# Patient Record
Sex: Female | Born: 1995 | Race: Black or African American | Hispanic: No | Marital: Single | Smoking: Current some day smoker
Health system: Southern US, Community
[De-identification: ages and names within clinical notes are randomized; demographics above are authoritative.]

## PROBLEM LIST (undated history)

## (undated) ENCOUNTER — Inpatient Hospital Stay (HOSPITAL_COMMUNITY): Payer: Self-pay

## (undated) DIAGNOSIS — O42919 Preterm premature rupture of membranes, unspecified as to length of time between rupture and onset of labor, unspecified trimester: Secondary | ICD-10-CM

## (undated) DIAGNOSIS — Z789 Other specified health status: Secondary | ICD-10-CM

## (undated) DIAGNOSIS — E119 Type 2 diabetes mellitus without complications: Secondary | ICD-10-CM

## (undated) DIAGNOSIS — D649 Anemia, unspecified: Secondary | ICD-10-CM

## (undated) DIAGNOSIS — F419 Anxiety disorder, unspecified: Secondary | ICD-10-CM

## (undated) DIAGNOSIS — M1990 Unspecified osteoarthritis, unspecified site: Secondary | ICD-10-CM

## (undated) HISTORY — PX: TONSILLECTOMY: SUR1361

## (undated) HISTORY — DX: Preterm premature rupture of membranes, unspecified as to length of time between rupture and onset of labor, unspecified trimester: O42.919

## (undated) HISTORY — DX: Unspecified osteoarthritis, unspecified site: M19.90

## (undated) HISTORY — DX: Anemia, unspecified: D64.9

## (undated) HISTORY — DX: Type 2 diabetes mellitus without complications: E11.9

---
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
## 2014-11-17 ENCOUNTER — Inpatient Hospital Stay (HOSPITAL_COMMUNITY)
Admission: AD | Admit: 2014-11-17 | Discharge: 2014-11-17 | Disposition: A | Discharge status: Home / Self Care | Payer: Medicaid Other | Source: Ambulatory Visit | Attending: Obstetrics & Gynecology | Admitting: Obstetrics & Gynecology

## 2014-11-17 ENCOUNTER — Encounter (HOSPITAL_COMMUNITY): Payer: Self-pay | Admitting: *Deleted

## 2014-11-17 DIAGNOSIS — O9989 Other specified diseases and conditions complicating pregnancy, childbirth and the puerperium: Secondary | ICD-10-CM | POA: Diagnosis present

## 2014-11-17 DIAGNOSIS — Z3A15 15 weeks gestation of pregnancy: Secondary | ICD-10-CM | POA: Diagnosis not present

## 2014-11-17 DIAGNOSIS — N926 Irregular menstruation, unspecified: Secondary | ICD-10-CM

## 2014-11-17 NOTE — MAU Provider Note (Signed)
Subjective:   [name] is a 19 y.o. female G1P0 at [redacted]w[redacted]d presenting to MAU for a pregnancy verification letter. She is without complaints. She denies pain or bleeding at this time.  She has not started prenatal care.    Objective:   GENERAL: Well-developed, well-nourished female in no acute distress.  LUNGS: Effort normal SKIN: Warm, dry and without erythema PSYCH: Normal mood and affect  Filed Vitals:   11/17/14 1139  BP: 132/67  Pulse: 93  Temp: 98.8 F (37.1 C)  Resp: 18    MDM: + fetal heart tones by doppler.   Assessment:  Missed period  Plan:  Discharge home in stable condition First trimester warning signs discussed Pregnancy verification letter provided Prenatal vitamins daily.   Duane Lope, NP 11/17/2014 3:14 PM

## 2014-11-17 NOTE — MAU Note (Signed)
Believes she is preg.  2 +HPT.  No problems just needs confirmation.

## 2014-12-08 ENCOUNTER — Encounter: Payer: Medicaid Other | Admitting: Certified Nurse Midwife

## 2014-12-09 ENCOUNTER — Inpatient Hospital Stay (HOSPITAL_COMMUNITY)
Admission: AD | Admit: 2014-12-09 | Discharge: 2014-12-20 | DRG: 765 | Disposition: A | Discharge status: Home / Self Care | Payer: Medicaid Other | Source: Ambulatory Visit | Attending: Obstetrics and Gynecology | Admitting: Obstetrics and Gynecology

## 2014-12-09 ENCOUNTER — Encounter (HOSPITAL_COMMUNITY): Payer: Self-pay | Admitting: *Deleted

## 2014-12-09 ENCOUNTER — Inpatient Hospital Stay (HOSPITAL_COMMUNITY): Payer: Medicaid Other

## 2014-12-09 DIAGNOSIS — O99824 Streptococcus B carrier state complicating childbirth: Secondary | ICD-10-CM | POA: Diagnosis present

## 2014-12-09 DIAGNOSIS — Z720 Tobacco use: Secondary | ICD-10-CM | POA: Diagnosis not present

## 2014-12-09 DIAGNOSIS — O23592 Infection of other part of genital tract in pregnancy, second trimester: Secondary | ICD-10-CM

## 2014-12-09 DIAGNOSIS — O411230 Chorioamnionitis, third trimester, not applicable or unspecified: Secondary | ICD-10-CM | POA: Diagnosis present

## 2014-12-09 DIAGNOSIS — A5901 Trichomonal vulvovaginitis: Secondary | ICD-10-CM | POA: Diagnosis present

## 2014-12-09 DIAGNOSIS — O321XX0 Maternal care for breech presentation, not applicable or unspecified: Principal | ICD-10-CM | POA: Diagnosis present

## 2014-12-09 DIAGNOSIS — O0932 Supervision of pregnancy with insufficient antenatal care, second trimester: Secondary | ICD-10-CM

## 2014-12-09 DIAGNOSIS — Z3A23 23 weeks gestation of pregnancy: Secondary | ICD-10-CM

## 2014-12-09 DIAGNOSIS — O3432 Maternal care for cervical incompetence, second trimester: Secondary | ICD-10-CM | POA: Diagnosis present

## 2014-12-09 DIAGNOSIS — Z3A36 36 weeks gestation of pregnancy: Secondary | ICD-10-CM | POA: Diagnosis present

## 2014-12-09 DIAGNOSIS — O3421 Maternal care for scar from previous cesarean delivery: Secondary | ICD-10-CM | POA: Diagnosis present

## 2014-12-09 DIAGNOSIS — O42919 Preterm premature rupture of membranes, unspecified as to length of time between rupture and onset of labor, unspecified trimester: Secondary | ICD-10-CM | POA: Diagnosis not present

## 2014-12-09 DIAGNOSIS — O9832 Other infections with a predominantly sexual mode of transmission complicating childbirth: Secondary | ICD-10-CM | POA: Diagnosis present

## 2014-12-09 DIAGNOSIS — R109 Unspecified abdominal pain: Secondary | ICD-10-CM

## 2014-12-09 DIAGNOSIS — O42912 Preterm premature rupture of membranes, unspecified as to length of time between rupture and onset of labor, second trimester: Secondary | ICD-10-CM | POA: Diagnosis present

## 2014-12-09 DIAGNOSIS — Z3689 Encounter for other specified antenatal screening: Secondary | ICD-10-CM

## 2014-12-09 DIAGNOSIS — Z98891 History of uterine scar from previous surgery: Secondary | ICD-10-CM

## 2014-12-09 DIAGNOSIS — O99012 Anemia complicating pregnancy, second trimester: Secondary | ICD-10-CM | POA: Diagnosis present

## 2014-12-09 HISTORY — DX: Other specified health status: Z78.9

## 2014-12-09 LAB — DIFFERENTIAL
BASOS PCT: 0 % (ref 0–1)
Basophils Absolute: 0 10*3/uL (ref 0.0–0.1)
EOS PCT: 1 % (ref 0–5)
Eosinophils Absolute: 0.1 10*3/uL (ref 0.0–0.7)
Lymphocytes Relative: 12 % (ref 12–46)
Lymphs Abs: 1.5 10*3/uL (ref 0.7–4.0)
MONO ABS: 1.2 10*3/uL — AB (ref 0.1–1.0)
MONOS PCT: 10 % (ref 3–12)
NEUTROS ABS: 10 10*3/uL — AB (ref 1.7–7.7)
Neutrophils Relative %: 77 % (ref 43–77)

## 2014-12-09 LAB — URINALYSIS, ROUTINE W REFLEX MICROSCOPIC
Bilirubin Urine: NEGATIVE
GLUCOSE, UA: NEGATIVE mg/dL
Ketones, ur: NEGATIVE mg/dL
Nitrite: NEGATIVE
PH: 6 (ref 5.0–8.0)
PROTEIN: 100 mg/dL — AB
Specific Gravity, Urine: >1.03 — ABNORMAL HIGH (ref 1.005–1.030)
Urobilinogen, UA: 0.2 mg/dL (ref 0.0–1.0)

## 2014-12-09 LAB — CBC
HEMATOCRIT: 30.3 % — AB (ref 36.0–46.0)
Hemoglobin: 10.1 g/dL — ABNORMAL LOW (ref 12.0–15.0)
MCH: 28.2 pg (ref 26.0–34.0)
MCHC: 33.3 g/dL (ref 30.0–36.0)
MCV: 84.6 fL (ref 78.0–100.0)
PLATELETS: 219 10*3/uL (ref 150–400)
RBC: 3.58 MIL/uL — ABNORMAL LOW (ref 3.87–5.11)
RDW: 13.3 % (ref 11.5–15.5)
WBC: 12.9 10*3/uL — ABNORMAL HIGH (ref 4.0–10.5)

## 2014-12-09 LAB — URINE MICROSCOPIC-ADD ON

## 2014-12-09 LAB — WET PREP, GENITAL
Clue Cells Wet Prep HPF POC: NONE SEEN
YEAST WET PREP: NONE SEEN

## 2014-12-09 LAB — GROUP B STREP BY PCR: GROUP B STREP BY PCR: POSITIVE — AB

## 2014-12-09 LAB — OB RESULTS CONSOLE GBS: STREP GROUP B AG: POSITIVE

## 2014-12-09 MED ORDER — ZOLPIDEM TARTRATE 5 MG PO TABS
5.0000 mg | ORAL_TABLET | Freq: Every evening | ORAL | Status: DC | PRN
  Administered 2014-12-09: 5 mg via ORAL
  Filled 2014-12-09: qty 1

## 2014-12-09 MED ORDER — ACETAMINOPHEN 325 MG PO TABS
650.0000 mg | ORAL_TABLET | ORAL | Status: DC | PRN
  Administered 2014-12-11: 650 mg via ORAL
  Filled 2014-12-09: qty 2

## 2014-12-09 MED ORDER — DOCUSATE SODIUM 100 MG PO CAPS
100.0000 mg | ORAL_CAPSULE | Freq: Every day | ORAL | Status: DC
  Administered 2014-12-10 – 2014-12-16 (×7): 100 mg via ORAL
  Filled 2014-12-09 (×10): qty 1

## 2014-12-09 MED ORDER — SODIUM CHLORIDE 0.9 % IV SOLN
500.0000 mg | Freq: Four times a day (QID) | INTRAVENOUS | Status: DC
  Administered 2014-12-09: 500 mg via INTRAVENOUS
  Filled 2014-12-09 (×4): qty 10

## 2014-12-09 MED ORDER — MAGNESIUM SULFATE 50 % IJ SOLN
2.0000 g/h | INTRAMUSCULAR | Status: AC
Start: 2014-12-09 — End: 2014-12-10
  Administered 2014-12-10: 2 g/h via INTRAVENOUS
  Filled 2014-12-09 (×2): qty 80

## 2014-12-09 MED ORDER — MAGNESIUM SULFATE BOLUS VIA INFUSION
4.0000 g | Freq: Once | INTRAVENOUS | Status: AC
  Administered 2014-12-10: 4 g via INTRAVENOUS
  Filled 2014-12-09: qty 500

## 2014-12-09 MED ORDER — BETAMETHASONE SOD PHOS & ACET 6 (3-3) MG/ML IJ SUSP
12.0000 mg | INTRAMUSCULAR | Status: AC
  Administered 2014-12-09 – 2014-12-10 (×2): 12 mg via INTRAMUSCULAR
  Filled 2014-12-09 (×2): qty 2

## 2014-12-09 MED ORDER — METRONIDAZOLE 500 MG PO TABS
2000.0000 mg | ORAL_TABLET | Freq: Once | ORAL | Status: AC
  Administered 2014-12-09: 2000 mg via ORAL
  Filled 2014-12-09: qty 4

## 2014-12-09 MED ORDER — SODIUM CHLORIDE 0.9 % IV SOLN
2.0000 g | Freq: Four times a day (QID) | INTRAVENOUS | Status: DC
  Administered 2014-12-09 – 2014-12-11 (×8): 2 g via INTRAVENOUS
  Filled 2014-12-09 (×10): qty 2000

## 2014-12-09 MED ORDER — CALCIUM CARBONATE ANTACID 500 MG PO CHEW
2.0000 | CHEWABLE_TABLET | ORAL | Status: DC | PRN
  Administered 2014-12-09 – 2014-12-13 (×4): 400 mg via ORAL
  Filled 2014-12-09 (×5): qty 2

## 2014-12-09 MED ORDER — LACTATED RINGERS IV SOLN
INTRAVENOUS | Status: DC
  Administered 2014-12-09 – 2014-12-17 (×16): via INTRAVENOUS

## 2014-12-09 MED ORDER — PRENATAL MULTIVITAMIN CH
1.0000 | ORAL_TABLET | Freq: Every day | ORAL | Status: DC
  Administered 2014-12-10 – 2014-12-16 (×7): 1 via ORAL
  Filled 2014-12-09 (×8): qty 1

## 2014-12-09 NOTE — MAU Provider Note (Signed)
History     CSN: [id number]  Arrival date and time: 12/09/14 1611   First Provider Initiated Contact with Patient 12/09/14 1700      Chief Complaint  Patient presents with  . Abdominal Pain   HPI Giordana N Schou 19 y.o. G1P0 @[redacted]w[redacted]d  presents to MAU complaining of lower abdominal pain.  It started at 2:30pm today.  She has not tried anything to make it feel better.  As soon as it started hurting, she called the ambulance to pick her up.  She reports it is 10/10.  She is eating and drinking well.  She last ate at 12:00pm and it was a sausage biscuit and a few donuts.  She denies vaginal bleeding, LOF.  She does have discharge with odor.  She may be feeling fetal movement but is not certain.  She is using a PNV.  She does not yet have PNC established.   OB History    Gravida Para Term Preterm AB TAB SAB Ectopic Multiple Living   1               History reviewed. No pertinent past medical history.  History reviewed. No pertinent past surgical history.  History reviewed. No pertinent family history.  Social History  Substance Use Topics  . Smoking status: Light Tobacco Smoker  . Smokeless tobacco: Never Used  . Alcohol Use: No    Allergies: No Known Allergies  Prescriptions prior to admission  Medication Sig Dispense Refill Last Dose  . acetaminophen (TYLENOL) 325 MG tablet Take 325 mg by mouth every 6 (six) hours as needed for mild pain.   12/09/2014 at 1030  . Prenatal Vit-Fe Fumarate-FA (PRENATAL MULTIVITAMIN) TABS tablet Take 2 tablets by mouth daily at 12 noon.   12/09/2014 at Unknown time    ROS Pertinent ROS in HPI.  All other systems are negative.   Physical Exam   Blood pressure 138/68, pulse 89, temperature 98.1 F (36.7 C), resp. rate 18, last menstrual period 08/04/2014.  Physical Exam  Constitutional: She is oriented to person, place, and time. She appears well-developed and well-nourished. No distress.  HENT:  Head: Normocephalic and atraumatic.  Eyes: EOM  are normal.  Neck: Normal range of motion.  Cardiovascular: Normal rate and normal heart sounds.   Respiratory: No respiratory distress.  GI: Soft. She exhibits no distension. There is no tenderness. There is no rebound and no guarding.  Genitourinary:  Large amt of pink frothy liquid noted.  Bulging bag noted inside lip of cervix Speculum carefully withdrawn.  No bimanual exam performed  Musculoskeletal: Normal range of motion.  Neurological: She is alert and oriented to person, place, and time.  Skin: Skin is warm and dry.  Psychiatric: She has a normal mood and affect.    MAU Course  Procedures  MDM Pt placed in trendelenburg after noting bulging bag.   Bedside u/s confirmed to determine dating of pregnancy Results for orders placed or performed during the hospital encounter of 12/09/14 (from the past 24 hour(s))  Urinalysis, Routine w reflex microscopic (not at [facility])     Status: Abnormal   Collection Time: 12/09/14  4:15 PM  Result Value Ref Range   Color, Urine YELLOW YELLOW   APPearance CLOUDY (A) CLEAR   Specific Gravity, Urine >1.030 (H) 1.005 - 1.030   pH 6.0 5.0 - 8.0   Glucose, UA NEGATIVE NEGATIVE mg/dL   Hgb urine dipstick MODERATE (A) NEGATIVE   Bilirubin Urine NEGATIVE NEGATIVE   Ketones, ur  NEGATIVE NEGATIVE mg/dL   Protein, ur 161 (A) NEGATIVE mg/dL   Urobilinogen, UA 0.2 0.0 - 1.0 mg/dL   Nitrite NEGATIVE NEGATIVE   Leukocytes, UA MODERATE (A) NEGATIVE  Urine microscopic-add on     Status: Abnormal   Collection Time: 12/09/14  4:15 PM  Result Value Ref Range   Squamous Epithelial / LPF FEW (A) RARE   WBC, UA TOO NUMEROUS TO COUNT <3 WBC/hpf   RBC / HPF 7-10 <3 RBC/hpf   Crystals CA OXALATE CRYSTALS (A) NEGATIVE   Urine-Other TRICHOMONAS PRESENT   Wet prep, genital     Status: Abnormal   Collection Time: 12/09/14  5:10 PM  Result Value Ref Range   Yeast Wet Prep HPF POC NONE SEEN NONE SEEN   Trich, Wet Prep FEW (A) NONE SEEN   Clue Cells Wet Prep HPF  POC NONE SEEN NONE SEEN   WBC, Wet Prep HPF POC MODERATE (A) NONE SEEN   Trichomonas noted.   IV started as pt is 23+ weeks by bedside scan.  Final U/S report confirms 23 w4d.   Dr. Despina Hidden to bedside to eval pt.  Pt to be admitted.    Assessment and Plan  A: Threatened PPROM with bulging bag Trichomonas  P: Admit  Bertram Denver 12/09/2014, 5:01 PM

## 2014-12-09 NOTE — MAU Note (Signed)
Pt presents to MAU via EMS for complaints of pain in her lower abdomen when she voids. PT states that she noticed a scant amount of bleeding earlier when she wiped also.

## 2014-12-09 NOTE — H&P (Signed)
Chief Complaint  Patient presents with  . Abdominal Pain   HPI Melinda Richardson 19 y.o. G1P0 @[redacted]w[redacted]d  presents to MAU complaining of lower abdominal pain/pressure. It started at 2:30pm today. She has not tried anything to make it feel better. As soon as it started hurting, she called the ambulance to pick her up. She reports it is 10/10. She is eating and drinking well. She last ate at 12:00pm and it was a sausage biscuit and a few donuts. She denies vaginal bleeding, LOF. She does have discharge with odor. She may be feeling fetal movement but is not certain. She is using a PNV. She does not yet have PNC established.  OB History    Gravida Para Term Preterm AB TAB SAB Ectopic Multiple Living   1               History reviewed. No pertinent past medical history.  History reviewed. No pertinent past surgical history.  History reviewed. No pertinent family history.  Social History  Substance Use Topics  . Smoking status: Light Tobacco Smoker  . Smokeless tobacco: Never Used  . Alcohol Use: No    Allergies: No Known Allergies  Prescriptions prior to admission  Medication Sig Dispense Refill Last Dose  . acetaminophen (TYLENOL) 325 MG tablet Take 325 mg by mouth every 6 (six) hours as needed for mild pain.   12/09/2014 at 1030  . Prenatal Vit-Fe Fumarate-FA (PRENATAL MULTIVITAMIN) TABS tablet Take 2 tablets by mouth daily at 12 noon.   12/09/2014 at Unknown time    ROS Pertinent ROS in HPI. All other systems are negative.   Physical Exam   Blood pressure 138/68, pulse 89, temperature 98.1 F (36.7 C), resp. rate 18, last menstrual period 08/04/2014.  Physical Exam  Constitutional: She is oriented to person, place, and time. She appears well-developed and well-nourished. No distress.  HENT:  Head: Normocephalic and atraumatic.  Eyes: EOM are normal.  Neck: Normal range of motion.   Cardiovascular: Normal rate and normal heart sounds.  Respiratory: No respiratory distress.  GI: Soft. She exhibits no distension. There is no tenderness. There is no rebound and no guarding.  Genitourinary:  Large amt of pink frothy liquid noted.  Bulging bag noted inside lip of cervix Speculum carefully withdrawn. No bimanual exam performed visually 3-4 cm Musculoskeletal: Normal range of motion.  Neurological: She is alert and oriented to person, place, and time.  Skin: Skin is warm and dry.  Psychiatric: She has a normal mood and affect.    MAU Course  Procedures  MDM Pt placed in trendelenburg after noting bulging bag.  Bedside u/s confirmed to determine dating of pregnancy  Lab Results Last 24 Hours    Results for orders placed or performed during the hospital encounter of 12/09/14 (from the past 24 hour(s))  Urinalysis, Routine w reflex microscopic (not at [facility]) Status: Abnormal   Collection Time: 12/09/14 4:15 PM  Result Value Ref Range   Color, Urine YELLOW YELLOW   APPearance CLOUDY (A) CLEAR   Specific Gravity, Urine >1.030 (H) 1.005 - 1.030   pH 6.0 5.0 - 8.0   Glucose, UA NEGATIVE NEGATIVE mg/dL   Hgb urine dipstick MODERATE (A) NEGATIVE   Bilirubin Urine NEGATIVE NEGATIVE   Ketones, ur NEGATIVE NEGATIVE mg/dL   Protein, ur 440 (A) NEGATIVE mg/dL   Urobilinogen, UA 0.2 0.0 - 1.0 mg/dL   Nitrite NEGATIVE NEGATIVE   Leukocytes, UA MODERATE (A) NEGATIVE  Urine microscopic-add on Status: Abnormal  Collection Time: 12/09/14 4:15 PM  Result Value Ref Range   Squamous Epithelial / LPF FEW (A) RARE   WBC, UA TOO NUMEROUS TO COUNT <3 WBC/hpf   RBC / HPF 7-10 <3 RBC/hpf   Crystals CA OXALATE CRYSTALS (A) NEGATIVE   Urine-Other TRICHOMONAS PRESENT   Wet prep, genital Status: Abnormal   Collection Time: 12/09/14 5:10 PM  Result Value Ref Range   Yeast Wet  Prep HPF POC NONE SEEN NONE SEEN   Trich, Wet Prep FEW (A) NONE SEEN   Clue Cells Wet Prep HPF POC NONE SEEN NONE SEEN   WBC, Wet Prep HPF POC MODERATE (A) NONE SEEN     Trichomonas noted.  IV started as pt is 23+ weeks by bedside scan.   Sonogram reveals no measureable cervix but not protruding past the cervix Assessment and Plan      [redacted]w[redacted]d G1P0 No prenatal care Cervical Incompetence, this presentation is definitely incompetence, not uterine activity mediated Vaginal trichomonas  Latency antibiotics Trendelenburg position Metronidazole for trichomonas Magnesium prophylaxis  Betamethasone IM x 2 NICU consult Draw all prenatal labs  [name], MD

## 2014-12-10 LAB — HIV ANTIBODY (ROUTINE TESTING W REFLEX): HIV Screen 4th Generation wRfx: NONREACTIVE

## 2014-12-10 LAB — AMNISURE RUPTURE OF MEMBRANE (ROM) NOT AT ARMC: AMNISURE: POSITIVE

## 2014-12-10 LAB — RPR: RPR: NONREACTIVE

## 2014-12-10 LAB — RUBELLA SCREEN: RUBELLA: 2.62 {index} (ref >0.99)

## 2014-12-10 LAB — GC/CHLAMYDIA PROBE AMP (~~LOC~~) NOT AT ARMC
Chlamydia: NEGATIVE
Neisseria Gonorrhea: NEGATIVE

## 2014-12-10 LAB — HEPATITIS B SURFACE ANTIGEN: Hepatitis B Surface Ag: NEGATIVE

## 2014-12-10 MED ORDER — SODIUM CHLORIDE 0.9 % IV SOLN
500.0000 mg | Freq: Four times a day (QID) | INTRAVENOUS | Status: DC
  Administered 2014-12-10 – 2014-12-13 (×13): 500 mg via INTRAVENOUS
  Filled 2014-12-10 (×15): qty 10

## 2014-12-10 NOTE — Progress Notes (Signed)
Pt calling RN to bedside and stating that she is not voiding but that she has fluid coming out of her vagina. Moderate amount of fluid on towel under buttocks, orders for amnisure.

## 2014-12-10 NOTE — Consult Note (Signed)
Neonatology Consult Note:  At the request of the patients obstetrician Dr. Elonda Husky I met with Ms. Vanburen (and her parents).  She is currently 23 [redacted] weeks pregnant with pregancy complicated by no prenatal care, cervical Incompetence and vaginal trichomonas.  No ROM and she is not having uterine activity.   She is currently being treated with latency antibiotics, trendelenburg position, Metronidazole for trichomonas, Magnesium prophylaxis for neuroprotection, and betamethasone. We discussed morbidity/mortality at this gestional age, delivery room resuscitation, including intubation and surfactant in DR.  Discussed mechanical ventilation and risk for chronic lung disease, risk for IVH with potential for motor / cognitive deficits, ROP, NEC, sepsis, as well as temperature instability and feeding immaturity.  Discussed NG / OG feeds, benefits of MBM in reducing incidence of NEC.   Discussed likely length of stay. They articulated that they desired all resuscitative efforts.    Thank you for allowing [place] to participate in her care.  Please call with questions.  Higinio Roger, DO  Neonatologist  The total length of face-to-face or floor / unit time for this encounter was 25 minutes.  Counseling and / or coordination of care was greater than fifty percent of the time.

## 2014-12-10 NOTE — Plan of Care (Signed)
Problem: Consults Goal: Neonatologist Consult Outcome: Progressing NICU informed

## 2014-12-11 ENCOUNTER — Inpatient Hospital Stay (HOSPITAL_COMMUNITY): Payer: Medicaid Other

## 2014-12-11 ENCOUNTER — Encounter (HOSPITAL_COMMUNITY): Payer: Self-pay | Admitting: Family Medicine

## 2014-12-11 DIAGNOSIS — O42919 Preterm premature rupture of membranes, unspecified as to length of time between rupture and onset of labor, unspecified trimester: Secondary | ICD-10-CM

## 2014-12-11 DIAGNOSIS — O23592 Infection of other part of genital tract in pregnancy, second trimester: Secondary | ICD-10-CM

## 2014-12-11 DIAGNOSIS — O3432 Maternal care for cervical incompetence, second trimester: Secondary | ICD-10-CM

## 2014-12-11 DIAGNOSIS — A5901 Trichomonal vulvovaginitis: Secondary | ICD-10-CM

## 2014-12-11 DIAGNOSIS — Z3A23 23 weeks gestation of pregnancy: Secondary | ICD-10-CM

## 2014-12-11 HISTORY — DX: Preterm premature rupture of membranes, unspecified as to length of time between rupture and onset of labor, unspecified trimester: O42.919

## 2014-12-11 LAB — CULTURE, OB URINE

## 2014-12-11 MED ORDER — AMOXICILLIN 500 MG PO CAPS
500.0000 mg | ORAL_CAPSULE | Freq: Three times a day (TID) | ORAL | Status: AC
  Administered 2014-12-11 – 2014-12-16 (×16): 500 mg via ORAL
  Filled 2014-12-11 (×16): qty 1

## 2014-12-11 NOTE — Progress Notes (Signed)
FACULTY PRACTICE ANTEPARTUM NOTE  Melinda Richardson is a 19 y.o. G1P0 at [redacted]w[redacted]d  who is admitted for cervical incompetence with PPROM last night.   Fetal presentation is cephalic. Length of Stay:  2  Days  Subjective: Patient complains of continued small amount of leaking fluid. She has increased leaking fluid with sitting up. She denies contractions, abdominal pain, fevers, chills, nausea, vomiting, leg pain. She reports no uterine contractions She reports no bleeding   I reviewed patient's past medical history, allergies, past surgical history, social history.  Vitals:  Blood pressure 125/63, pulse 99, temperature 97.5 F (36.4 C), temperature source Oral, resp. rate 16, height  (1.651 m), weight 255 lb (115.667 kg), last menstrual period 08/04/2014. Physical Examination:  General appearance - alert, well appearing, and in no distress Chest - clear to auscultation, no wheezes, rales or rhonchi, symmetric air entry Heart - normal rate, regular rhythm, normal S1, S2, no murmurs, rubs, clicks or gallops Abdomen - soft, nontender, nondistended, no masses or organomegaly Fundal Height:  size equals dates Extremities: extremities normal, atraumatic, no cyanosis or edema with DTRs 2+ bilaterally Membranes: ruptured  Fetal Monitoring:  Baseline: 145 bpm, Variability: Good {> 6 bpm), Accelerations: Non-reactive but appropriate for gestational age and Decelerations: Variable: mild  Labs:  Results for orders placed or performed during the hospital encounter of 12/09/14 (from the past 24 hour(s))  Amnisure rupture of membrane (rom)not at [facility]   Collection Time: 12/10/14  6:15 PM  Result Value Ref Range   Amnisure ROM POSITIVE     Imaging Studies:      Medications:  Scheduled . ampicillin (OMNIPEN) IV  2 g Intravenous 4 times per day  . docusate sodium  100 mg Oral Daily  . erythromycin  500 mg Intravenous 4 times per day  . prenatal multivitamin  1 tablet Oral Q1200   I have  reviewed the patient's current medications.  ASSESSMENT: Patient Active Problem List   Diagnosis Date Noted  . Preterm premature rupture of membranes (PPROM) with unknown onset of labor 12/11/2014  . [redacted] weeks gestation of pregnancy 12/11/2014  . Cervical incompetence during pregnancy in second trimester 12/09/2014  . No prenatal care in current pregnancy in second trimester 12/09/2014  . Trichomonal vaginitis during pregnancy in second trimester 12/09/2014    PLAN: #1 intrauterine gestation at 23 weeks and 6 days  Anatomic fetal survey ultrasound ordered with MFM #2 cervical incompetence  No evidence of contractions. No increased pressure per patient. #3 P PROM  I had a discussion with the parents regarding their wishes as to resuscitation should the patient deliver within the next 2 weeks. The parents will discuss this and let us know their wishes for resuscitation.  We'll have NICU reconsulted to discuss given the change in circumstance.  Continue latency antibiotics  Status post betamethasone 2  Test post magnesium for neuro protection  Will need serial growth ultrasounds #4 Trichomonas vaginalis  Treated with metronidazole #5 GBS positive  Will need ampicillin in active labor  Continue routine antenatal care.   Rhona Raider Sreshta Cressler, DO 12/11/2014,7:37 AM

## 2014-12-11 NOTE — Progress Notes (Signed)
Subjective: Patient reports no contractions or bleeding, still some LOF   Objective: I have reviewed patient's vital signs, medications and radiology results.  Pelvic exam: normal external genitalia, vulva, vagina, cervix, uterus and adnexa, VULVA: normal appearing vulva with no masses, tenderness or lesions, VAGINA: normal appearing vagina with normal color and discharge, no lesions, CERVIX: normal appearing cervix without discharge or lesions, closed by sterile speculum exam, no small parts seen.. [place] report reviewed, breech and anhydramnios, requested exam to evaluate if foot is in the cervix. No small parts at the cervical os on exam 23.2 weeks today Assessment/Plan: 23.2 weeks with PPROM, footling breech Patient currently requests no cesarean section. The prognosis for neonatal complications and survival were discussed and I mentioned that cesarean section might be offered for active labor or fetal distress but that C/S may not improve the baby's prognosis at 23.2 weeks.   LOS: 2 days    Melinda Richardson 12/11/2014, 4:28 PM

## 2014-12-12 ENCOUNTER — Encounter (HOSPITAL_COMMUNITY): Payer: Self-pay | Admitting: Internal Medicine

## 2014-12-12 NOTE — Progress Notes (Signed)
Pt found to be sitting SF up in the bed and encouraged pt to lie down and turn side to side. Pt pointing to discomfort and describing it as "bubling", appears to be higher abdominal area and more likely gastrointestinal. Pt instructed that side to side would help move "gas" pains out.

## 2014-12-12 NOTE — Progress Notes (Signed)
Patient ID: Melinda Richardson, female   DOB: [birth year], 19 y.o.   MRN: [id number] FACULTY PRACTICE ANTEPARTUM(COMPREHENSIVE) NOTE  CHANNIE BOSTICK is a 19 y.o. G1P0 at [redacted]w[redacted]d by midtrimester ultrasound who is admitted for rupture of membranes.   Fetal presentation is breech. Length of Stay:  3  Days  Subjective: Pt resting comfortably Patient reports the fetal movement as active. Patient reports uterine contraction  activity as none. Patient reports  vaginal bleeding as none. Patient describes fluid per vagina as Clear.  Vitals:  Blood pressure 131/67, pulse 83, temperature 98.5 F (36.9 C), temperature source Oral, resp. rate 18, height  (1.651 m), weight 255 lb (115.667 kg), last menstrual period 08/04/2014. Physical Examination:  General appearance - alert, well appearing, and in no distress, oriented to person, place, and time and overweight Heart - normal rate and regular rhythm Abdomen - soft, nontender, nondistended Fundal Height:  size equals dates Cervical Exam: Not evaluated.  Extremities: extremities normal, atraumatic, no cyanosis or edema and Homans sign is negative, no sign of DVT with DTRs 2+ bilaterally Membranes:ruptured, clear fluid  Fetal Monitoring:  monitor q 8 h  Labs:  No results found for this or any previous visit (from the past 24 hour(s)).  Imaging Studies:     Currently EPIC will not allow sonographic studies to automatically populate into notes.  In the meantime, copy and paste results into note or free text.  Medications:  Scheduled . amoxicillin  500 mg Oral 3 times per day  . docusate sodium  100 mg Oral Daily  . erythromycin  500 mg Intravenous 4 times per day  . prenatal multivitamin  1 tablet Oral Q1200   I have reviewed the patient's current medications.  ASSESSMENT: Patient Active Problem List   Diagnosis Date Noted  . Preterm premature rupture of membranes (PPROM) with unknown onset of labor 12/11/2014  . [redacted] weeks gestation of  pregnancy 12/11/2014  . Cervical incompetence during pregnancy in second trimester 12/09/2014  . No prenatal care in current pregnancy in second trimester 12/09/2014  . Trichomonal vaginitis during pregnancy in second trimester 12/09/2014    PLAN: PPROM 23 w 3 day by u/s after PPROM, BREECH PRESENTATION. Pt aware that vaginal delivery of preemie breech infant carries increased risk of long term neurologic problems, and aware that we will likely recommend c/s delivery if labor develops. NICU has seen pt and counselled her. I have discussed the uncertain viability at this early gestation.  Jermani Eberlein V 12/12/2014,8:40 AM

## 2014-12-13 LAB — TYPE AND SCREEN
ABO/RH(D): O POS
ANTIBODY SCREEN: POSITIVE
DAT, IGG: NEGATIVE
PT AG TYPE: NEGATIVE
Unit division: 0
Unit division: 0

## 2014-12-13 MED ORDER — ERYTHROMYCIN BASE 250 MG PO TBEC
250.0000 mg | DELAYED_RELEASE_TABLET | Freq: Four times a day (QID) | ORAL | Status: AC
  Administered 2014-12-13 – 2014-12-16 (×14): 250 mg via ORAL
  Filled 2014-12-13 (×20): qty 1

## 2014-12-13 NOTE — Progress Notes (Signed)
Patient ID: Melinda Richardson, female   DOB: 1995-12-28, 19 y.o.   MRN: [id number] FACULTY PRACTICE ANTEPARTUM(COMPREHENSIVE) NOTE  Melinda Richardson is a 19 y.o. G1P0 at [redacted]w[redacted]d by midtrimester ultrasound who is admitted for rupture of membranes.   Fetal presentation is footling breech.  Length of Stay:  4  Days  Subjective: Pt resting comfortably Patient reports the fetal movement as active. Patient reports uterine contraction  activity as none. Patient reports  vaginal bleeding as none. Patient describes fluid per vagina as clear.  Vitals:  Blood pressure 116/44, pulse 72, temperature 98.5 F (36.9 C), temperature source Oral, resp. rate 18, height  (1.651 m), weight 255 lb (115.667 kg), last menstrual period 08/04/2014. Physical Examination: General appearance - alert, well appearing, and in no distress, oriented to person, place, and time and overweight Heart - normal rate and regular rhythm Abdomen - soft, nontender, nondistended Fundal Height:  size equals dates Cervical Exam: Not evaluated.  Extremities: extremities normal, atraumatic, no cyanosis or edema and Homans sign is negative, no sign of DVT with DTRs 2+ bilaterally Membranes:ruptured, clear fluid  Fetal Monitoring: Reassuring for gestational age.    Labs:  Results for orders placed or performed during the hospital encounter of 12/09/14 (from the past 24 hour(s))  Type and screen   Collection Time: 12/12/14  7:05 PM  Result Value Ref Range   ABO/RH(D) O POS    Antibody Screen POS    Sample Expiration 12/15/2014    Antibody Identification ANTI LEA Melvyn Neth a)    DAT, IgG NEG    Unit Number [id number]    Blood Component Type RBC LR PHER1    Unit division 00    Status of Unit ALLOCATED    Transfusion Status OK TO TRANSFUSE    Crossmatch Result COMPATIBLE    Unit Number [id number]    Blood Component Type RBC LR PHER2    Unit division 00    Status of Unit ALLOCATED    Transfusion Status OK TO TRANSFUSE     Crossmatch Result COMPATIBLE     Imaging Studies:    12/11/14 [redacted]w[redacted]d EFW 543g (1-3)/46%, anhydramnios, footling breech, anterior placenta, foot in internal cervical canal (not visualized on SSE)  Medications:  Scheduled . amoxicillin  500 mg Oral 3 times per day  . docusate sodium  100 mg Oral Daily  . erythromycin  250 mg Oral 4 times per day  . prenatal multivitamin  1 tablet Oral Q1200   I have reviewed the patient's current medications.  ASSESSMENT: Patient Active Problem List   Diagnosis Date Noted  . Preterm premature rupture of membranes (PPROM) with unknown onset of labor 12/11/2014  . [redacted] weeks gestation of pregnancy 12/11/2014  . Cervical incompetence during pregnancy in second trimester 12/09/2014  . No prenatal care in current pregnancy in second trimester 12/09/2014  . Trichomonal vaginitis during pregnancy in second trimester 12/09/2014    PLAN: PPROM 23 w 4 day by u/s after PPROM, BREECH PRESENTATION. Will continue latency antibiotics, she is s/p betamethasone Patient aware that vaginal delivery of premature breech infant carries increased risk of long term neurologic problems, and aware that we will likely recommend c/s delivery if labor develops. NICU has seen and counseled her. Uncertain viability at this early gestation has been discussed with patient. Continue close inpatient observation  Valda Christenson A, MD 12/13/2014,7:45 AM

## 2014-12-14 MED ORDER — PANTOPRAZOLE SODIUM 40 MG PO TBEC
40.0000 mg | DELAYED_RELEASE_TABLET | Freq: Every day | ORAL | Status: DC
  Administered 2014-12-14 – 2014-12-16 (×3): 40 mg via ORAL
  Filled 2014-12-14 (×3): qty 1

## 2014-12-14 NOTE — Progress Notes (Signed)
Patient ID: Melinda Richardson, female   DOB: [birth year], 19 y.o.   MRN: [id number] FACULTY PRACTICE ANTEPARTUM(COMPREHENSIVE) NOTE  Melinda Richardson is a 19 y.o. G1P0 at [redacted]w[redacted]d by midtrimester ultrasound who is admitted for PPROM.  Fetal presentation is footling breech.  Length of Stay:  5  Days  Subjective: Resting comfortably. No complaints. Patient reports the fetal movement as active. Patient reports uterine contraction  activity as none. Patient reports  vaginal bleeding as none. Patient describes fluid per vagina as clear.  Vitals:  Blood pressure 109/50, pulse 85, temperature 98.1 F (36.7 C), temperature source Oral, resp. rate 18, height  (1.651 m), weight 255 lb (115.667 kg), last menstrual period 08/04/2014. Physical Examination: General appearance: alert, well appearing, and in no distress, oriented to person, place, and time and overweight Heart: normal rate and regular rhythm Abdomen: soft, nontender, nondistended Fundal Height:  size equals dates Cervical Exam: Not evaluated.  Extremities: extremities normal, atraumatic, no cyanosis or edema and Homans sign is negative, no sign of DVT with DTRs 2+ bilaterally Membranes: ruptured, clear fluid  Fetal Monitoring: Reassuring for gestational age.    Labs:  No results found for this or any previous visit (from the past 24 hour(s)).  Imaging Studies:    12/11/14 [redacted]w[redacted]d EFW 543g (1-3)/46%, anhydramnios, footling breech, anterior placenta, foot in internal cervical canal (not visualized on SSE)  Medications:  Scheduled . amoxicillin  500 mg Oral 3 times per day  . docusate sodium  100 mg Oral Daily  . erythromycin  250 mg Oral 4 times per day  . pantoprazole  40 mg Oral Daily  . prenatal multivitamin  1 tablet Oral Q1200   I have reviewed the patient's current medications.  ASSESSMENT: Patient Active Problem List   Diagnosis Date Noted  . Preterm premature rupture of membranes (PPROM) with unknown onset of labor  12/11/2014  . Cervical incompetence during pregnancy in second trimester 12/09/2014  . No prenatal care in current pregnancy in second trimester 12/09/2014  . Trichomonal vaginitis during pregnancy in second trimester 12/09/2014    PLAN: Will continue latency antibiotics, she is s/p betamethasone Patient aware that vaginal delivery of premature breech infant carries increased risk of long term neurologic problems, and is aware that we will likely recommend esarean delivery if labor develops. NICU has seen and counseled her. Uncertain viability at this early gestation has been discussed with patient. Continue close inpatient observation  Tereso Newcomer, MD 12/14/2014,8:42 AM

## 2014-12-15 LAB — TYPE AND SCREEN
ABO/RH(D): O POS
ANTIBODY SCREEN: POSITIVE
DAT, IgG: NEGATIVE
UNIT DIVISION: 0
Unit division: 0

## 2014-12-15 LAB — CBC
HCT: 28.3 % — ABNORMAL LOW (ref 36.0–46.0)
Hemoglobin: 9.3 g/dL — ABNORMAL LOW (ref 12.0–15.0)
MCH: 28 pg (ref 26.0–34.0)
MCHC: 32.9 g/dL (ref 30.0–36.0)
MCV: 85.2 fL (ref 78.0–100.0)
PLATELETS: 229 10*3/uL (ref 150–400)
RBC: 3.32 MIL/uL — ABNORMAL LOW (ref 3.87–5.11)
RDW: 13.5 % (ref 11.5–15.5)
WBC: 15 10*3/uL — ABNORMAL HIGH (ref 4.0–10.5)

## 2014-12-15 NOTE — Clinical SW OB High Risk (Signed)
Clinical Social Work Antenatal   Clinical Social Worker:  Barbee [place], [place] Date/Time:  12/15/2014, 2:12 PM Gestational Age on Admission:  19 y.o. Admitting Diagnosis: Cervical Incompetence   Expected Delivery Date:  04/07/15  [facility] Environment  Home Address: [address], [place], [place] [address]  Household Member/Support Name: Patient states she lives with her "godmother."  She reports her mother and step father are supportive and live locally. Relationship:    Other Support:    Psychosocial Data  Information Source:  Patient Interview Resources:    Employment:    Medicaid [facility] [employer]): [employer] School:    Current Grade:   Homebound Arranged: No  Other Resources:  Copywriter, advertising Care: None stated   Strengths/Weaknesses/Factors to Consider  Concerns Related to Hospitalization: Patient reports she is "tired of looking at these walls."  She is not currently displaying signs of depression or anxiety related to the hospitalization and appears to be coping well at this time.   Previous Pregnancies/Feelings Towards Pregnancy?  Concerns related to being/becoming a mother?: MOB states she was nervous when she first learned of the pregnancy, approximately 1 month ago, but states she is happy at this point.    Social Support (FOB? Who is/will be helping with baby/other kids?): FOB not involved.  Patient states little contact with him and reports that this is not a source of stress for her.  She reports good support from her mother, step-father, and godmother.  Couples Relationship (describe): Patient is not in a relationship with FOB.  He is minimally in contact with patient.   Recent Stressful Life Events (life changes in past year?): None stated   Prenatal Care/Education/Home Preparations: Patient has had no prenatal care.  CSW inquired about this and patient reports that she "honestly doesn't know why she hasn't been to the  doctor."  She then stated that she had an appointment with [facility], but wasn't able to go and was told that she could not reschedule since she missed her first appointment.  She states no issues with transportation and that she has active Medicaid.   Domestic Violence (of any type):  No (None stated) If Yes to Domestic Violence, Describe/Action Plan:    Substance Use During Pregnancy: No (No known substance use) (If Yes, Complete SBIRT)  Follow-up Recommendations: Patient made aware of CSW support services offered while she is hospitalized.  Contact information given.   Patient Advised/Response: Patient thanked CSW for the visit and states no questions, concerns or needs at this time.    Other:     Clinical Assessment/Plan: Patient was pleasant and receptive to CSW intervention, however, did not seem to have much she wanted to talk about.  She reports coping well, other than not liking to "be tied down."  Patient was able to identify coping strategies, such as watching tv and talking on the phone.  She appears to have good supports and reports being happy about the pregnancy.  She states no questions, concerns or needs at this time and understands that she may call CSW any time.

## 2014-12-15 NOTE — Progress Notes (Signed)
Patient ID: Melinda Richardson, female   DOB: 1995-04-21, 19 y.o.   MRN: [id number] FACULTY PRACTICE ANTEPARTUM(COMPREHENSIVE) NOTE  Melinda Richardson is a 19 y.o. G1P0 at [redacted]w[redacted]d by midtrimester ultrasound who is admitted for PPROM.  Fetal presentation is footling breech.  Length of Stay:  6  Days  Subjective: Resting comfortably. No complaints. Patient reports the fetal movement as active. Patient reports uterine contraction  activity as none. Patient reports  vaginal bleeding as none. Patient describes fluid per vagina as clear.  Vitals:  Blood pressure 119/51, pulse 92, temperature 98.8 F (37.1 C), temperature source Oral, resp. rate 18, height  (1.651 m), weight 255 lb (115.667 kg), last menstrual period 08/04/2014, SpO2 98 %. Physical Examination: General appearance: alert, well appearing, and in no distress, oriented to person, place, and time and overweight Heart: normal rate and regular rhythm Abdomen: soft, nontender, nondistended Fundal Height:  size equals dates Cervical Exam: Not evaluated.  Extremities: extremities normal, atraumatic, no cyanosis or edema and Homans sign is negative, no sign of DVT with DTRs 2+ bilaterally Membranes: ruptured, clear fluid  Fetal Monitoring: Reassuring for gestational age.    Labs:    CBC Latest Ref Rng 12/15/2014 12/09/2014 03/17/2010  WBC 4.0 - 10.5 K/uL 15.0(H) 12.9(H) -  Hemoglobin 12.0 - 15.0 g/dL 7.8(G) 10.1(L) 12.2  Hematocrit 36.0 - 46.0 % 28.3(L) 30.3(L) 36.0  Platelets 150 - 400 K/uL 229 219 -  CBC and T&S ordered every 72 hours starting today.  Imaging Studies:   12/11/14 [redacted]w[redacted]d EFW 543g (1-3)/46%, anhydramnios, footling breech, anterior placenta, foot in internal cervical canal (not visualized on SSE)  Medications:  Scheduled . amoxicillin  500 mg Oral 3 times per day  . docusate sodium  100 mg Oral Daily  . erythromycin  250 mg Oral 4 times per day  . pantoprazole  40 mg Oral Daily  . prenatal multivitamin  1 tablet  Oral Q1200   I have reviewed the patient's current medications.  ASSESSMENT: Patient Active Problem List   Diagnosis Date Noted  . Preterm premature rupture of membranes (PPROM) with unknown onset of labor 12/11/2014  . Cervical incompetence during pregnancy in second trimester 12/09/2014  . No prenatal care in current pregnancy in second trimester 12/09/2014  . Trichomonal vaginitis during pregnancy in second trimester 12/09/2014    PLAN: Will continue latency antibiotics, she is s/p betamethasone Delivery is indicated for chorioamnionitis, or other maternal-fetal indications Patient aware that vaginal delivery of premature breech infant carries increased risk of long term neurologic problems, and is aware that we will likely recommend cesarean delivery if labor develops. NICU has seen and counseled her. Uncertain viability at this early gestation has been discussed with patient. Continue close inpatient observation  Grey Schlauch A, MD 12/15/2014,7:40 AM

## 2014-12-16 NOTE — Progress Notes (Signed)
FACULTY PRACTICE ANTEPARTUM(COMPREHENSIVE) NOTE  Melinda Richardson is a 19 y.o. G1P0 at [redacted]w[redacted]d by best clinical estimate who is admitted for rupture of membranes, PROM.   Fetal presentation is breech. Length of Stay:  7  Days  Subjective:  Patient reports the fetal movement as active. Patient reports uterine contraction  activity as none. Patient reports  vaginal bleeding as none. Patient describes fluid per vagina as Clear.  Vitals:  Blood pressure 111/57, pulse 90, temperature 99.1 F (37.3 C), temperature source Oral, resp. rate 18, height  (1.651 m), weight 115.667 kg (255 lb), last menstrual period 08/04/2014, SpO2 98 %. Physical Examination:  General appearance - alert, well appearing, and in no distress Heart - normal rate and regular rhythm Abdomen - soft, nontender, nondistended Fundal Height:  size equals dates and not tender Cervical Exam: Not evaluated.. Extremities: extremities normal, atraumatic, no cyanosis or edema and Homans sign is negative, no sign of DVT  Membranes:ruptured  Fetal Monitoring:    Fetal Heart Rate A     Mode  External filed at 12/16/2014 0749    Baseline Rate (A)  150 bpm filed at 12/16/2014 0749    Variability  6-25 BPM filed at 12/15/2014 0052    Accelerations  None filed at 12/10/2014 2309    Decelerations  Variable filed at 12/10/2014 2309        Labs:  No results found for this or any previous visit (from the past 24 hour(s)).    Medications:  Scheduled . amoxicillin  500 mg Oral 3 times per day  . docusate sodium  100 mg Oral Daily  . erythromycin  250 mg Oral 4 times per day  . pantoprazole  40 mg Oral Daily  . prenatal multivitamin  1 tablet Oral Q1200   I have reviewed the patient's current medications.  ASSESSMENT: Patient Active Problem List   Diagnosis Date Noted  . Preterm premature rupture of membranes (PPROM) with unknown onset of labor 12/11/2014  . Cervical incompetence during pregnancy in second  trimester 12/09/2014  . No prenatal care in current pregnancy in second trimester 12/09/2014  . Trichomonal vaginitis during pregnancy in second trimester 12/09/2014    PLAN: Continue monitoring for s/sx III or PTL  Dennys Guin 12/16/2014,8:03 AM

## 2014-12-16 NOTE — Progress Notes (Signed)
Initial Nutrition Assessment  DOCUMENTATION CODES:   Morbid obesity  INTERVENTION:  Regular diet May order double protein portions, snacks TID and from retail  NUTRITION DIAGNOSIS:   Increased nutrient needs related to  (pregnancy and fetal growth requirements) as evidenced by  ([redacted] weeks pregnant).    GOAL:   Patient will meet greater than or equal to 90% of their needs   MONITOR:   Weight trends  REASON FOR ASSESSMENT:   Antenatal    ASSESSMENT:   24 weeks, PROM. appetite good, diet tolerted well. Pre-preg weight of 240 lbs, BMI 40, weight gain rec's 11-20 lbs    Diet Order:  Diet regular Room service appropriate?: Yes; Fluid consistency:: Thin  Skin:  Reviewed, no issues  Height:   Ht Readings from Last 1 Encounters:  12/09/14  (1.651 m) (61 %*, Z = 0.27)   * Growth percentiles are based on CDC 2-20 Years data.    Weight:   Wt Readings from Last 1 Encounters:  12/09/14 255 lb (115.667 kg) (99 %*, Z = 2.55)   * Growth percentiles are based on CDC 2-20 Years data.    Ideal Body Weight:     BMI:  Body mass index is 42.43 kg/(m^2).  Estimated Nutritional Needs:   Kcal:  2400-2600  Protein:  100-110 g  Fluid:  2.6 L  EDUCATION NEEDS: none required at this time    Inez Pilgrim.Odis Luster LDN Neonatal Nutrition Support Specialist/RD III Pager [phone]      Phone [phone]

## 2014-12-17 ENCOUNTER — Encounter (HOSPITAL_COMMUNITY): Payer: Self-pay | Admitting: Anesthesiology

## 2014-12-17 ENCOUNTER — Inpatient Hospital Stay (HOSPITAL_COMMUNITY): Payer: Medicaid Other | Admitting: Anesthesiology

## 2014-12-17 ENCOUNTER — Encounter (HOSPITAL_COMMUNITY): Payer: Self-pay

## 2014-12-17 ENCOUNTER — Encounter (HOSPITAL_COMMUNITY)
Admission: AD | Disposition: A | Discharge status: Home / Self Care | Payer: Self-pay | Source: Ambulatory Visit | Attending: Obstetrics & Gynecology

## 2014-12-17 DIAGNOSIS — O411230 Chorioamnionitis, third trimester, not applicable or unspecified: Secondary | ICD-10-CM

## 2014-12-17 DIAGNOSIS — Z3A36 36 weeks gestation of pregnancy: Secondary | ICD-10-CM

## 2014-12-17 DIAGNOSIS — O3421 Maternal care for scar from previous cesarean delivery: Secondary | ICD-10-CM

## 2014-12-17 DIAGNOSIS — O99824 Streptococcus B carrier state complicating childbirth: Secondary | ICD-10-CM

## 2014-12-17 DIAGNOSIS — O321XX0 Maternal care for breech presentation, not applicable or unspecified: Secondary | ICD-10-CM

## 2014-12-17 DIAGNOSIS — A5901 Trichomonal vulvovaginitis: Secondary | ICD-10-CM

## 2014-12-17 DIAGNOSIS — O3432 Maternal care for cervical incompetence, second trimester: Secondary | ICD-10-CM

## 2014-12-17 DIAGNOSIS — O9832 Other infections with a predominantly sexual mode of transmission complicating childbirth: Secondary | ICD-10-CM

## 2014-12-17 DIAGNOSIS — O42912 Preterm premature rupture of membranes, unspecified as to length of time between rupture and onset of labor, second trimester: Secondary | ICD-10-CM

## 2014-12-17 DIAGNOSIS — Z98891 History of uterine scar from previous surgery: Secondary | ICD-10-CM

## 2014-12-17 SURGERY — Surgical Case
Anesthesia: Spinal

## 2014-12-17 MED ORDER — CITRIC ACID-SODIUM CITRATE 334-500 MG/5ML PO SOLN
ORAL | Status: AC
  Administered 2014-12-17: 30 mL
  Filled 2014-12-17: qty 15

## 2014-12-17 MED ORDER — MENTHOL 3 MG MT LOZG: 1.0000 | LOZENGE | OROMUCOSAL | Status: DC | PRN

## 2014-12-17 MED ORDER — DIPHENHYDRAMINE HCL 25 MG PO CAPS: 25.0000 mg | ORAL_CAPSULE | ORAL | Status: DC | PRN

## 2014-12-17 MED ORDER — OXYTOCIN 40 UNITS IN LACTATED RINGERS INFUSION - SIMPLE MED
62.5000 mL/h | INTRAVENOUS | Status: AC
Start: 2014-12-17 — End: 2014-12-18

## 2014-12-17 MED ORDER — DIPHENHYDRAMINE HCL 50 MG/ML IJ SOLN: 12.5000 mg | INTRAMUSCULAR | Status: DC | PRN

## 2014-12-17 MED ORDER — IBUPROFEN 600 MG PO TABS
600.0000 mg | ORAL_TABLET | Freq: Four times a day (QID) | ORAL | Status: DC
Start: 2014-12-17 — End: 2014-12-20
  Administered 2014-12-17 – 2014-12-20 (×10): 600 mg via ORAL
  Filled 2014-12-17 (×9): qty 1

## 2014-12-17 MED ORDER — OXYCODONE-ACETAMINOPHEN 5-325 MG PO TABS
1.0000 | ORAL_TABLET | ORAL | Status: DC | PRN
  Administered 2014-12-18 – 2014-12-19 (×3): 1 via ORAL
  Filled 2014-12-17 (×3): qty 1

## 2014-12-17 MED ORDER — PRENATAL MULTIVITAMIN CH
1.0000 | ORAL_TABLET | Freq: Every day | ORAL | Status: DC
Start: 2014-12-17 — End: 2014-12-20
  Administered 2014-12-18 – 2014-12-20 (×3): 1 via ORAL
  Filled 2014-12-17 (×3): qty 1

## 2014-12-17 MED ORDER — SODIUM CHLORIDE 0.9 % IJ SOLN: 3.0000 mL | INTRAMUSCULAR | Status: DC | PRN

## 2014-12-17 MED ORDER — WITCH HAZEL-GLYCERIN EX PADS: 1.0000 "application " | MEDICATED_PAD | CUTANEOUS | Status: DC | PRN

## 2014-12-17 MED ORDER — OXYTOCIN 10 UNIT/ML IJ SOLN
INTRAMUSCULAR | Status: AC
  Filled 2014-12-17: qty 4

## 2014-12-17 MED ORDER — NALBUPHINE HCL 10 MG/ML IJ SOLN
INTRAMUSCULAR | Status: AC
  Filled 2014-12-17: qty 1

## 2014-12-17 MED ORDER — CITRIC ACID-SODIUM CITRATE 334-500 MG/5ML PO SOLN: 30.0000 mL | Freq: Once | ORAL | Status: DC

## 2014-12-17 MED ORDER — NALBUPHINE HCL 10 MG/ML IJ SOLN
5.0000 mg | INTRAMUSCULAR | Status: DC | PRN
  Administered 2014-12-17: 5 mg via SUBCUTANEOUS
  Filled 2014-12-17: qty 0.5

## 2014-12-17 MED ORDER — TETANUS-DIPHTH-ACELL PERTUSSIS 5-2.5-18.5 LF-MCG/0.5 IM SUSP
0.5000 mL | Freq: Once | INTRAMUSCULAR | Status: AC
  Administered 2014-12-18: 0.5 mL via INTRAMUSCULAR

## 2014-12-17 MED ORDER — NALOXONE HCL 0.4 MG/ML IJ SOLN: 0.4000 mg | INTRAMUSCULAR | Status: DC | PRN

## 2014-12-17 MED ORDER — SODIUM CHLORIDE 0.9 % IR SOLN
Status: DC | PRN
  Administered 2014-12-17: 1000 mL

## 2014-12-17 MED ORDER — NALBUPHINE HCL 10 MG/ML IJ SOLN
5.0000 mg | INTRAMUSCULAR | Status: DC | PRN
  Filled 2014-12-17: qty 0.5

## 2014-12-17 MED ORDER — DEXTROSE 5 % IV SOLN
1.0000 ug/kg/h | INTRAVENOUS | Status: DC | PRN
Start: 2014-12-17 — End: 2014-12-20
  Filled 2014-12-17: qty 2

## 2014-12-17 MED ORDER — DIPHENHYDRAMINE HCL 25 MG PO CAPS: 25.0000 mg | ORAL_CAPSULE | Freq: Four times a day (QID) | ORAL | Status: DC | PRN

## 2014-12-17 MED ORDER — SIMETHICONE 80 MG PO CHEW
80.0000 mg | CHEWABLE_TABLET | ORAL | Status: DC
  Administered 2014-12-17 – 2014-12-19 (×3): 80 mg via ORAL
  Filled 2014-12-17 (×3): qty 1

## 2014-12-17 MED ORDER — HYDROMORPHONE HCL 1 MG/ML IJ SOLN
0.2500 mg | INTRAMUSCULAR | Status: DC | PRN
  Administered 2014-12-17 (×2): 0.5 mg via INTRAVENOUS

## 2014-12-17 MED ORDER — ZOLPIDEM TARTRATE 5 MG PO TABS: 5.0000 mg | ORAL_TABLET | Freq: Every evening | ORAL | Status: DC | PRN

## 2014-12-17 MED ORDER — SENNOSIDES-DOCUSATE SODIUM 8.6-50 MG PO TABS
2.0000 | ORAL_TABLET | ORAL | Status: DC
  Administered 2014-12-17 – 2014-12-19 (×3): 2 via ORAL
  Filled 2014-12-17 (×4): qty 2

## 2014-12-17 MED ORDER — ACETAMINOPHEN 500 MG PO TABS
1000.0000 mg | ORAL_TABLET | Freq: Four times a day (QID) | ORAL | Status: AC
  Administered 2014-12-17 – 2014-12-18 (×2): 1000 mg via ORAL
  Filled 2014-12-17 (×2): qty 2

## 2014-12-17 MED ORDER — CEFAZOLIN SODIUM-DEXTROSE 2-3 GM-% IV SOLR
INTRAVENOUS | Status: DC | PRN
  Administered 2014-12-17: 2 g via INTRAVENOUS

## 2014-12-17 MED ORDER — MEPERIDINE HCL 25 MG/ML IJ SOLN: 6.2500 mg | INTRAMUSCULAR | Status: DC | PRN

## 2014-12-17 MED ORDER — PHENYLEPHRINE 8 MG IN D5W 100 ML (0.08MG/ML) PREMIX OPTIME
INJECTION | INTRAVENOUS | Status: AC
  Filled 2014-12-17: qty 100

## 2014-12-17 MED ORDER — MORPHINE SULFATE 0.5 MG/ML IJ SOLN
INTRAMUSCULAR | Status: AC
Start: 2014-12-17 — End: 2014-12-17
  Filled 2014-12-17: qty 100

## 2014-12-17 MED ORDER — DIBUCAINE 1 % RE OINT: 1.0000 "application " | TOPICAL_OINTMENT | RECTAL | Status: DC | PRN

## 2014-12-17 MED ORDER — NALBUPHINE HCL 10 MG/ML IJ SOLN: 5.0000 mg | Freq: Once | INTRAMUSCULAR | Status: AC | PRN

## 2014-12-17 MED ORDER — OXYCODONE-ACETAMINOPHEN 5-325 MG PO TABS: 2.0000 | ORAL_TABLET | ORAL | Status: DC | PRN

## 2014-12-17 MED ORDER — LACTATED RINGERS IV SOLN
INTRAVENOUS | Status: DC
Start: 2014-12-17 — End: 2014-12-20

## 2014-12-17 MED ORDER — SCOPOLAMINE 1 MG/3DAYS TD PT72
MEDICATED_PATCH | TRANSDERMAL | Status: DC | PRN
  Administered 2014-12-17: 1 via TRANSDERMAL

## 2014-12-17 MED ORDER — ONDANSETRON HCL 4 MG/2ML IJ SOLN
INTRAMUSCULAR | Status: AC
  Filled 2014-12-17: qty 2

## 2014-12-17 MED ORDER — FENTANYL CITRATE (PF) 100 MCG/2ML IJ SOLN
INTRAMUSCULAR | Status: AC
  Filled 2014-12-17: qty 4

## 2014-12-17 MED ORDER — OXYTOCIN 10 UNIT/ML IJ SOLN
40.0000 [IU] | INTRAVENOUS | Status: DC | PRN
  Administered 2014-12-17: 40 [IU] via INTRAVENOUS

## 2014-12-17 MED ORDER — NALBUPHINE HCL 10 MG/ML IJ SOLN
5.0000 mg | Freq: Once | INTRAMUSCULAR | Status: AC | PRN
  Administered 2014-12-17: 5 mg via SUBCUTANEOUS

## 2014-12-17 MED ORDER — HYDROMORPHONE HCL 1 MG/ML IJ SOLN
INTRAMUSCULAR | Status: AC
  Filled 2014-12-17: qty 1

## 2014-12-17 MED ORDER — SCOPOLAMINE 1 MG/3DAYS TD PT72
MEDICATED_PATCH | TRANSDERMAL | Status: AC
Start: 2014-12-17 — End: 2014-12-17
  Filled 2014-12-17: qty 1

## 2014-12-17 MED ORDER — SIMETHICONE 80 MG PO CHEW: 80.0000 mg | CHEWABLE_TABLET | ORAL | Status: DC | PRN

## 2014-12-17 MED ORDER — SCOPOLAMINE 1 MG/3DAYS TD PT72: 1.0000 | MEDICATED_PATCH | Freq: Once | TRANSDERMAL | Status: DC

## 2014-12-17 MED ORDER — LANOLIN HYDROUS EX OINT: 1.0000 "application " | TOPICAL_OINTMENT | CUTANEOUS | Status: DC | PRN

## 2014-12-17 MED ORDER — ACETAMINOPHEN 325 MG PO TABS: 650.0000 mg | ORAL_TABLET | ORAL | Status: DC | PRN

## 2014-12-17 MED ORDER — SIMETHICONE 80 MG PO CHEW
80.0000 mg | CHEWABLE_TABLET | Freq: Three times a day (TID) | ORAL | Status: DC
  Administered 2014-12-17 – 2014-12-20 (×6): 80 mg via ORAL
  Filled 2014-12-17 (×5): qty 1

## 2014-12-17 MED ORDER — CEFAZOLIN SODIUM-DEXTROSE 2-3 GM-% IV SOLR
INTRAVENOUS | Status: AC
Start: 2014-12-17 — End: 2014-12-17
  Filled 2014-12-17: qty 50

## 2014-12-17 MED ORDER — ONDANSETRON HCL 4 MG/2ML IJ SOLN: 4.0000 mg | Freq: Three times a day (TID) | INTRAMUSCULAR | Status: DC | PRN

## 2014-12-17 MED ORDER — MORPHINE SULFATE (PF) 0.5 MG/ML IJ SOLN
INTRAMUSCULAR | Status: DC | PRN
  Administered 2014-12-17: .1 mg via INTRATHECAL

## 2014-12-17 MED ORDER — ONDANSETRON HCL 4 MG/2ML IJ SOLN
INTRAMUSCULAR | Status: DC | PRN
  Administered 2014-12-17: 4 mg via INTRAVENOUS

## 2014-12-17 MED ORDER — PHENYLEPHRINE 8 MG IN D5W 100 ML (0.08MG/ML) PREMIX OPTIME
INJECTION | INTRAVENOUS | Status: DC | PRN
  Administered 2014-12-17: 60 ug/min via INTRAVENOUS

## 2014-12-17 SURGICAL SUPPLY — 35 items
BENZOIN TINCTURE PRP APPL 2/3 (GAUZE/BANDAGES/DRESSINGS) ×3 IMPLANT
CLAMP CORD UMBIL (MISCELLANEOUS) IMPLANT
CLOSURE WOUND 1/2 X4 (GAUZE/BANDAGES/DRESSINGS) ×1
CLOTH BEACON ORANGE TIMEOUT ST (SAFETY) ×3 IMPLANT
DRAPE SHEET LG 3/4 BI-LAMINATE (DRAPES) IMPLANT
DRSG OPSITE POSTOP 4X10 (GAUZE/BANDAGES/DRESSINGS) ×3 IMPLANT
DURAPREP 26ML APPLICATOR (WOUND CARE) ×3 IMPLANT
ELECT REM PT RETURN 9FT ADLT (ELECTROSURGICAL) ×3
ELECTRODE REM PT RTRN 9FT ADLT (ELECTROSURGICAL) ×1 IMPLANT
EXTRACTOR VACUUM KIWI (MISCELLANEOUS) IMPLANT
GLOVE BIO SURGEON ST LM GN SZ9 (GLOVE) ×3 IMPLANT
GLOVE BIOGEL PI IND STRL 9 (GLOVE) ×1 IMPLANT
GLOVE BIOGEL PI INDICATOR 9 (GLOVE) ×2
GOWN STRL REUS W/TWL 2XL LVL3 (GOWN DISPOSABLE) ×3 IMPLANT
GOWN STRL REUS W/TWL LRG LVL3 (GOWN DISPOSABLE) ×3 IMPLANT
NEEDLE HYPO 25X5/8 SAFETYGLIDE (NEEDLE) IMPLANT
NS IRRIG 1000ML POUR BTL (IV SOLUTION) ×3 IMPLANT
PACK C SECTION WH (CUSTOM PROCEDURE TRAY) ×3 IMPLANT
PAD OB MATERNITY 4.3X12.25 (PERSONAL CARE ITEMS) ×3 IMPLANT
PENCIL SMOKE EVAC W/HOLSTER (ELECTROSURGICAL) ×3 IMPLANT
RTRCTR C-SECT PINK 25CM LRG (MISCELLANEOUS) IMPLANT
RTRCTR C-SECT PINK 34CM XLRG (MISCELLANEOUS) IMPLANT
STRIP CLOSURE SKIN 1/2X4 (GAUZE/BANDAGES/DRESSINGS) ×2 IMPLANT
SUT MNCRL 0 VIOLET CTX 36 (SUTURE) ×2 IMPLANT
SUT MONOCRYL 0 CTX 36 (SUTURE) ×4
SUT PLAIN 2 0 (SUTURE) ×2
SUT PLAIN ABS 2-0 CT1 27XMFL (SUTURE) ×1 IMPLANT
SUT VIC AB 0 CT1 27 (SUTURE) ×2
SUT VIC AB 0 CT1 27XBRD ANBCTR (SUTURE) ×1 IMPLANT
SUT VIC AB 2-0 CT1 27 (SUTURE) ×2
SUT VIC AB 2-0 CT1 TAPERPNT 27 (SUTURE) ×1 IMPLANT
SUT VIC AB 4-0 KS 27 (SUTURE) ×3 IMPLANT
SYR BULB IRRIGATION 50ML (SYRINGE) IMPLANT
TOWEL OR 17X24 6PK STRL BLUE (TOWEL DISPOSABLE) ×3 IMPLANT
TRAY FOLEY CATH SILVER 14FR (SET/KITS/TRAYS/PACK) ×3 IMPLANT

## 2014-12-17 NOTE — Brief Op Note (Signed)
12/09/2014 - 12/17/2014  4:09 PM  PATIENT:  Melinda Richardson  19 y.o. female  PRE-OPERATIVE DIAGNOSIS:  premature Breech 24weeks Advanced dilation  POST-OPERATIVE DIAGNOSIS:  premature Breech 24weeks Advanced dilation  PROCEDURE:  Procedure(s): CESAREAN SECTION (N/A)  SURGEON:  Surgeon(s) and Role:    * [name], MD - Primary    * Federico Flake, MD - Fellow  PHYSICIAN ASSISTANT:   ASSISTANTS: none   ANESTHESIA:   spinal  EBL:  Total I/O In: 1600 [I.V.:1600] Out: 1600 [Urine:1000; Blood:600]  BLOOD ADMINISTERED:none  DRAINS: none   LOCAL MEDICATIONS USED:  NONE  SPECIMEN:  Source of Specimen:  placenta  DISPOSITION OF SPECIMEN:  PATHOLOGY  COUNTS:  YES  TOURNIQUET:  * No tourniquets in log *  DICTATION: .Note written in EPIC  PLAN OF CARE: Admit to inpatient   PATIENT DISPOSITION:  PACU - hemodynamically stable.   Delay start of Pharmacological VTE agent (>24hrs) due to surgical blood loss or risk of bleeding: yes

## 2014-12-17 NOTE — Op Note (Signed)
Melinda Richardson PROCEDURE DATE: 12/17/2014 8:19 AM  PRE-OPERATIVE DIAGNOSIS:  premature Breech 24weeks,  Advanced dilation  POST-OPERATIVE DIAGNOSIS:  premature Breech 24weeks, Advanced dilation  PROCEDURE:  Procedure(s): CESAREAN SECTION (N/A)  SURGEON:  Surgeon(s) and Role:    * [name], MD - Primary    * Federico Flake, MD - Fellow  INDICATIONS: Melinda Richardson is a 19 y.o. G1P0101 at [redacted]w[redacted]d here for cesarean section secondary to the indications listed under preoperative diagnoses; please see preoperative note for further details.  The risks of cesarean section were discussed with the patient including but were not limited to: bleeding which may require transfusion or reoperation; infection which may require antibiotics; injury to bowel, bladder, ureters or other surrounding organs; injury to the fetus; need for additional procedures including hysterectomy in the event of a life-threatening hemorrhage; placental abnormalities wth subsequent pregnancies, incisional problems, thromboembolic phenomenon and other postoperative/anesthesia complications.   The patient concurred with the proposed plan, giving informed written consent for the procedure.    FINDINGS:  Viable female infant in cephalic presentation.  Apgars 1 and 4.  Clear amniotic fluid.  Intact placenta, three vessel cord.  Normal uterus, fallopian tubes and ovaries bilaterally.  ANESTHESIA: Spinal INTRAVENOUS FLUIDS: ESTIMATED BLOOD LOSS: 600 ml URINE OUTPUT:  200 ml SPECIMENS: Placenta sent to pathology COMPLICATIONS: None immediate  PROCEDURE IN DETAIL:  The patient preoperatively received intravenous antibiotics and had sequential compression devices applied to her lower extremities.  She was then taken to the operating room where spinal anesthesia was administeredand was found to be adequate. She was then placed in a dorsal supine position with a leftward tilt with abducted legs and prepped both  abdominally and vaginally and draped in a sterile manner.  A foley catheter was placed into her bladder and attached to constant gravity.  After an adequate timeout was performed, a Pfannenstiel skin incision was made with scalpel and carried through to the underlying layer of fascia. The fascia was incised in the midline, and this incision was extended bilaterally using the Mayo scissors.  Kocher clamps were applied to the superior aspect of the fascial incision and the underlying rectus muscles were dissected off sharply.  A similar process was carried out on the inferior aspect of the fascial incision. The rectus muscles were separated in the midline bluntly and the peritoneum was entered bluntly. Attention was turned to the lower uterine segment where a vertical hysterotomy was made with a scalpel and extended with bandage scissors. The infant was breech and hips were delivered first with subsequent delivery of legs and arms. Head was flexed and delivered successfully. The cord was clamped and cut and the infant was handed over to awaiting neonatology team. Uterine massage was then administered, and the placenta delivered intact with a three-vessel cord. The uterus was then cleared of clot and debris.  The hysterotomy was closed with 0 Monocryl in a running locked fashion, and an imbricating layer was also placed with 0 monocyl. Two vertical mattress sitches were placed on the left edge of hysterotomy for hemostasis. The pelvis was cleared of all clot and debris and copious irrigated. Hemostasis was confirmed on all surfaces.  The peritoneum and the muscles were reapproximated using 0 Vicryl running stitches. The fascia was then closed using 0 Vicryl in a running fashion.  The subcutaneous layer was irrigated,  then reapproximated with 2-0 plain gut interrupted stitches. Lower edge of skin incision was mobilized using cautery. The skin was closed  with a 4-0 Vicryl subcuticular stitch. The patient tolerated the  procedure well. Sponge, lap, instrument and needle counts were correct x 2.  She was taken to the recovery room in stable condition.   Based on the vertical hysterotomy the patient will necessitate repeat C-Section at 36 weeks for future pregnancies.  Federico Flake, MD Family Medicine, OB Fellow [facility]

## 2014-12-17 NOTE — Anesthesia Procedure Notes (Signed)
Spinal Patient location during procedure: OR Preanesthetic Checklist Completed: patient identified, site marked, surgical consent, pre-op evaluation, timeout performed, IV checked, risks and benefits discussed and monitors and equipment checked Spinal Block Patient position: sitting Prep: DuraPrep Patient monitoring: heart rate, cardiac monitor, continuous pulse ox and blood pressure Approach: midline Location: L3-4 Injection technique: single-shot Needle Needle type: Sprotte  Needle gauge: 24 G Needle length: 9 cm Assessment Sensory level: T4 Additional Notes Spinal Dosage in OR  Bupivicaine ml       1.5 PFMS04   mcg        100    

## 2014-12-17 NOTE — Anesthesia Postprocedure Evaluation (Signed)
  Anesthesia Post-op Note  Patient: Melinda Richardson  Procedure(s) Performed: Procedure(s): CESAREAN SECTION (N/A)  Patient is awake, responsive, moving her legs, and has signs of resolution of her numbness. Pain and nausea are reasonably well controlled. Vital signs are stable and clinically acceptable. Oxygen saturation is clinically acceptable. There are no apparent anesthetic complications at this time. Patient is ready for discharge.

## 2014-12-17 NOTE — Addendum Note (Signed)
Addendum  created 12/17/14 1409 by Renford Dills, CRNA   Modules edited: Notes Section   Notes Section:  File: [id number]

## 2014-12-17 NOTE — Anesthesia Postprocedure Evaluation (Signed)
  Anesthesia Post-op Note  Patient: Melinda Richardson  Procedure(s) Performed: Procedure(s): CESAREAN SECTION (N/A)  Patient Location: [facility] Unit  Anesthesia Type:Spinal  Level of Consciousness: awake  Airway and Oxygen Therapy: Patient Spontanous Breathing  Post-op Pain: mild  Post-op Assessment: Patient's Cardiovascular Status Stable and Respiratory Function Stable LLE Motor Response: Purposeful movement LLE Sensation: Numbness RLE Motor Response: Purposeful movement RLE Sensation: Numbness      Post-op Vital Signs: stable  Last Vitals:  Filed Vitals:   12/17/14 1230  BP: 117/61  Pulse: 83  Temp: 36.7 C  Resp: 18    Complications: No apparent anesthesia complications

## 2014-12-17 NOTE — Anesthesia Preprocedure Evaluation (Signed)
Anesthesia Evaluation  Patient identified by MRN, date of birth, ID band Patient awake    Reviewed: Allergy & Precautions, H&P , Patient's Chart, lab work & pertinent test results  Airway Mallampati: II  TM Distance: >3 FB Neck ROM: full    Dental no notable dental hx.    Pulmonary Current Smoker,    Pulmonary exam normal breath sounds clear to auscultation       Cardiovascular Exercise Tolerance: Good  Rhythm:regular Rate:Normal     Neuro/Psych    GI/Hepatic   Endo/Other    Renal/GU      Musculoskeletal   Abdominal   Peds  Hematology  (+) anemia ,   Anesthesia Other Findings   Reproductive/Obstetrics                             Anesthesia Physical Anesthesia Plan  ASA: III and emergent  Anesthesia Plan: Spinal   Post-op Pain Management:    Induction:   Airway Management Planned:   Additional Equipment:   Intra-op Plan:   Post-operative Plan:   Informed Consent: I have reviewed the patients History and Physical, chart, labs and discussed the procedure including the risks, benefits and alternatives for the proposed anesthesia with the patient or authorized representative who has indicated his/her understanding and acceptance.   Dental Advisory Given  Plan Discussed with: CRNA  Anesthesia Plan Comments: (Lab work confirmed with CRNA in room. Platelets okay. Discussed spinal anesthetic, and patient consents to the procedure:  included risk of possible headache,backache, failed block, allergic reaction, and nerve injury. This patient was asked if she had any questions or concerns before the procedure started. )        Anesthesia Quick Evaluation

## 2014-12-17 NOTE — Brief Op Note (Signed)
12/09/2014 - 12/17/2014  8:19 AM  PATIENT:  Melinda Richardson  19 y.o. female  PRE-OPERATIVE DIAGNOSIS:  premature Breech 24weeks Advanced dilation  POST-OPERATIVE DIAGNOSIS:  premature Breech 24weeks Advanced dilation  PROCEDURE:  Procedure(s): CESAREAN SECTION (N/A)  SURGEON:  Surgeon(s) and Role:    * [name], MD - Primary    * Federico Flake, MD - Fellow  PHYSICIAN ASSISTANT:   ASSISTANTS: none   ANESTHESIA:   spinal  EBL:  Total I/O In: 1600 [I.V.:1600] Out: 800 [Urine:200; Blood:600]  BLOOD ADMINISTERED:none  DRAINS: none   LOCAL MEDICATIONS USED:  NONE  SPECIMEN:  Source of Specimen:  Placenta  DISPOSITION OF SPECIMEN:  PATHOLOGY  COUNTS:  YES  TOURNIQUET:  * No tourniquets in log *  DICTATION: .Note written in EPIC  PLAN OF CARE: Admit to inpatient   PATIENT DISPOSITION:  PACU - hemodynamically stable.   Delay start of Pharmacological VTE agent (>24hrs) due to surgical blood loss or risk of bleeding: not applicable

## 2014-12-17 NOTE — Lactation Note (Addendum)
This note was copied from the chart of Melinda Richardson. Lactation Consultation Note  Patient Name: Melinda Richardson [id number] Date: 12/17/2014 Reason for consult: Initial assessment;NICU baby NICU baby 5 hours old. Mom return-demonstrated hand expression with colostrum present. Set mom up with DEBP and mom started pumping. Enc mom to pump 8 times/24 hours, sleeping for 5 hours straight, and then pumping more often in the morning. Enc mom to pump for 15 minutes, followed by hand expression. Mom given NICU booklet with review. Mom aware of OP/BFSG and LC phone line assistance after D/C. Mom aware of pumping rooms in NICU. Enc mom to use pictures and audio of baby on her phone while pumping. Enc mom to visit baby as able and to offer STS/Kangaroo care as she and baby are able. BF assistance referral faxed to [facility] office.  Maternal Data Has patient been taught Hand Expression?: Yes Does the patient have breastfeeding experience prior to this delivery?: No  Feeding    LATCH Score/Interventions                      Lactation Tools Discussed/Used WIC Program: Yes Pump Review: Setup, frequency, and cleaning;Milk Storage Initiated by:: JW Date initiated:: 12/17/14   Consult Status Consult Status: Follow-up Date: 12/18/14 Follow-up type: In-patient    Geralynn Ochs 12/17/2014, 12:37 PM

## 2014-12-17 NOTE — Transfer of Care (Signed)
Immediate Anesthesia Transfer of Care Note  Patient: Melinda Richardson  Procedure(s) Performed: Procedure(s): CESAREAN SECTION (N/A)  Patient Location: PACU  Anesthesia Type:Spinal  Level of Consciousness: awake  Airway & Oxygen Therapy: Patient Spontanous Breathing  Post-op Assessment: Report given to RN  Post vital signs: Reviewed and stable  Last Vitals:  Filed Vitals:   12/16/14 2038  BP: 142/68  Pulse: 95  Temp: 36.9 C  Resp: 18    Complications: No apparent anesthesia complications

## 2014-12-17 NOTE — Progress Notes (Signed)
Patient called out stating "she felt something coming out". Patient was placed on monitor. FHR 150 Dr. Emelda Fear on the unit and came in when nursed asked for assistance. Dr. Emelda Fear performed manual cervical exam. He stated he felt feet in the vagina. He explained to patient the risk of delivering vaginally and C-Section would be more appropriate. Patient prepped for OR, consent signed.

## 2014-12-17 NOTE — Progress Notes (Signed)
Melinda Richardson is a 19 y.o. G1P0 at [redacted]w[redacted]d by ultrasound admitted for rupture of membranes, PROM, with footling breech prasentation. She was admitted with cervical dilation of 3 cm and then had membrane rupture. Previous exams did not show feet or fetal parts prolapsing through the cervix  Subjective: pt has noted a feeling of movement in the vagina. She has not felt labor. Examination by me shows that the feet are in the just inside the introitus the cervix is not easily to feel but suspected to be restricted. Fetal heart rate is surprisingly normal there is no cord prolapse appreciable at this time we will proceed promptly to cesarean   Objective: BP 142/68 mmHg  Pulse 95  Temp(Src) 98.4 F (36.9 C) (Oral)  Resp 18  Ht  (1.651 m)  Wt 115.667 kg (255 lb)  BMI 42.43 kg/m2  SpO2 98%  LMP 08/04/2014 (Approximate)      FHT:  FHR: 145 bpm, variability: minimal ,  accelerations:  Present,  decelerations:  [place] very limited monitoring before proceeding to cesarean UC:   None appreciated by palpation SVE:    Fetal small parts in the vagina, not able to feel the vertex, with cervix suspected to be incompletely dilated  Labs: Lab Results  Component Value Date   WBC 15.0* 12/15/2014   HGB 9.3* 12/15/2014   HCT 28.3* 12/15/2014   MCV 85.2 12/15/2014   PLT 229 12/15/2014    Assessment / Plan: Pregnancy 24 weeks 1 day. Premature preterm rupture membranes fetal malpresentation with fetal parts in the vagina, feet  Labor: none Preeclampsia:  none Fetal Wellbeing:  Category II Pain Control:  We'll proceed to prompt cesarean section I/D:  n/a Anticipated MOD:  Cesarean section recommended patient consent quickly obtained having previously discussed this with the patient and partner  [name] 12/17/2014, 7:07 AM

## 2014-12-18 ENCOUNTER — Encounter (HOSPITAL_COMMUNITY): Payer: Self-pay | Admitting: Obstetrics and Gynecology

## 2014-12-18 LAB — CBC
HCT: 26.9 % — ABNORMAL LOW (ref 36.0–46.0)
HEMOGLOBIN: 8.7 g/dL — AB (ref 12.0–15.0)
MCH: 28 pg (ref 26.0–34.0)
MCHC: 32.3 g/dL (ref 30.0–36.0)
MCV: 86.5 fL (ref 78.0–100.0)
PLATELETS: 209 10*3/uL (ref 150–400)
RBC: 3.11 MIL/uL — ABNORMAL LOW (ref 3.87–5.11)
RDW: 13.9 % (ref 11.5–15.5)
WBC: 10 10*3/uL (ref 4.0–10.5)

## 2014-12-18 MED ORDER — FERROUS SULFATE 325 (65 FE) MG PO TABS
325.0000 mg | ORAL_TABLET | Freq: Three times a day (TID) | ORAL | Status: DC
  Administered 2014-12-19 – 2014-12-20 (×5): 325 mg via ORAL
  Filled 2014-12-18 (×5): qty 1

## 2014-12-18 NOTE — Progress Notes (Signed)
Subjective: Postpartum Day 1: Cesarean Delivery at [redacted]w[redacted]d after PPROM, PTL, breech presentation Patient reports incisional pain, tolerating PO, + flatus and no problems voiding.  Baby doing well in NICU as per patient.  Objective: Vital signs in last 24 hours: Temp:  [98 F (36.7 C)-99.9 F (37.7 C)] 98.3 F (36.8 C) (09/09 1029) Pulse Rate:  [76-91] 76 (09/09 1029) Resp:  [18-20] 18 (09/09 1029) BP: (96-135)/(39-75) 128/65 mmHg (09/09 1029) SpO2:  [96 %-100 %] 99 % (09/09 0504) Weight:  [246 lb (111.585 kg)] 246 lb (111.585 kg) (09/08 1158)  Physical Exam:  General: alert and no distress Lochia: appropriate Uterine Fundus: firm Incision: healing well, no significant drainage DVT Evaluation: No evidence of DVT seen on physical exam. Negative Homan's sign.  CBC Latest Ref Rng 12/18/2014 12/15/2014 12/09/2014  WBC 4.0 - 10.5 K/uL 10.0 15.0(H) 12.9(H)  Hemoglobin 12.0 - 15.0 g/dL 1.6(X) 0.9(U) 10.1(L)  Hematocrit 36.0 - 46.0 % 26.9(L) 28.3(L) 30.3(L)  Platelets 150 - 400 K/uL 209 229 219    Assessment/Plan: Status post Cesarean section. Doing well postoperatively.  Iron therapy given for asymptomatic anemia Encourage OOB, breastfeeding Possible discharge to home tomorrow Continue current care.  Lyan Moyano A, MD 12/18/2014, 11:07 AM

## 2014-12-18 NOTE — Lactation Note (Signed)
This note was copied from the chart of Melinda Richardson. Lactation Consultation Note  Patient Name: Melinda Richardson Date: 12/18/2014 Reason for consult: Follow-up assessment;NICU baby Baby 31 hours old. Mom states that she hasn't been pumping. Maternal Gma states that mom did not like pumping and just wants the baby to be bottle-fed. Provided anticipatory education and enc using cabbage leaves. Mom aware of LC phone line assistance after D/C.   Maternal Data    Feeding    LATCH Score/Interventions                      Lactation Tools Discussed/Used     Consult Status Consult Status: PRN    Geralynn Ochs 12/18/2014, 2:37 PM

## 2014-12-19 LAB — TYPE AND SCREEN
ABO/RH(D): O POS
ANTIBODY SCREEN: POSITIVE
DAT, IgG: NEGATIVE
UNIT DIVISION: 0
Unit division: 0

## 2014-12-19 NOTE — Progress Notes (Signed)
Subjective: Postpartum Day 2: Cesarean Delivery at [redacted]w[redacted]d after PPROM, PTL, breech presentation Patient reports incisional pain, tolerating PO, + flatus and no problems voiding.  Baby doing well in NICU as per patient.  Objective: Vital signs in last 24 hours: Temp:  [97.8 F (36.6 C)-98.3 F (36.8 C)] 97.8 F (36.6 C) (09/10 1203) Pulse Rate:  [63-100] 100 (09/10 1203) Resp:  [18] 18 (09/10 1203) BP: (127-139)/(55-71) 131/70 mmHg (09/10 1203) SpO2:  [99 %-100 %] 100 % (09/10 1203)  Physical Exam:  General: alert and no distress Lochia: appropriate Uterine Fundus: firm Incision: healing well, no significant drainage DVT Evaluation: No evidence of DVT seen on physical exam. Negative Homan's sign.  CBC Latest Ref Rng 12/18/2014 12/15/2014 12/09/2014  WBC 4.0 - 10.5 K/uL 10.0 15.0(H) 12.9(H)  Hemoglobin 12.0 - 15.0 g/dL 1.6(X) 0.9(U) 10.1(L)  Hematocrit 36.0 - 46.0 % 26.9(L) 28.3(L) 30.3(L)  Platelets 150 - 400 K/uL 209 229 219    Assessment/Plan: Status post Cesarean section. Doing well postoperatively.  Iron therapy given for asymptomatic anemia Encourage OOB, breastfeeding Possible discharge to home today vs tom, maternal preference given infant in NICU Continue current care.  Federico Flake, MD 12/19/2014, 3:38 PM

## 2014-12-20 MED ORDER — FERROUS SULFATE 325 (65 FE) MG PO TABS: 325.0000 mg | ORAL_TABLET | Freq: Three times a day (TID) | ORAL | Status: DC

## 2014-12-20 MED ORDER — OXYCODONE-ACETAMINOPHEN 5-325 MG PO TABS: 1.0000 | ORAL_TABLET | ORAL | Status: DC | PRN

## 2014-12-20 MED ORDER — IBUPROFEN 600 MG PO TABS: 600.0000 mg | ORAL_TABLET | Freq: Four times a day (QID) | ORAL | Status: DC

## 2014-12-20 MED ORDER — DOCUSATE SODIUM 100 MG PO CAPS: 100.0000 mg | ORAL_CAPSULE | Freq: Two times a day (BID) | ORAL | Status: DC

## 2014-12-20 NOTE — Progress Notes (Signed)
Discharge teaching complete. Pt understood all information and did not have any questions. Pt ambulated out of the hospital and discharged home to family.  

## 2014-12-20 NOTE — Discharge Summary (Signed)
Obstetric Discharge Summary Reason for Admission: rupture of membranes 12/10/14 Prenatal Procedures: NST Intrapartum Procedures: cesarean: classical Postpartum Procedures: none Complications-Operative and Postpartum: none HEMOGLOBIN  Date Value Ref Range Status  12/18/2014 8.7* 12.0 - 15.0 g/dL Final   HCT  Date Value Ref Range Status  12/18/2014 26.9* 36.0 - 46.0 % Final   Hospital Course: Patient was admitted on 9/1 with PPROM and was treated with latency antibiotics, steroids and monitored closely. She was diagnosed with trichomonas at that time and had had not prenatal care. She had multiple ultrasounds for AFI and fetal positioning. On the [place] on 9/2 it was noted that the infant, while previously cephalic was beech with a foot near the cervix. On 9/8 the patient felt movement in her vagina and was found to be breech with legs through the cervix. She was brought to the operating room for urgent CS.   Operative Report PROCEDURE IN DETAIL: The patient preoperatively received intravenous antibiotics and had sequential compression devices applied to her lower extremities. She was then taken to the operating room where spinal anesthesia was administeredand was found to be adequate. She was then placed in a dorsal supine position with a leftward tilt with abducted legs and prepped both abdominally and vaginally and draped in a sterile manner. A foley catheter was placed into her bladder and attached to constant gravity. After an adequate timeout was performed, a Pfannenstiel skin incision was made with scalpel and carried through to the underlying layer of fascia. The fascia was incised in the midline, and this incision was extended bilaterally using the Mayo scissors. Kocher clamps were applied to the superior aspect of the fascial incision and the underlying rectus muscles were dissected off sharply. A similar process was carried out on the inferior aspect of the fascial incision. The rectus  muscles were separated in the midline bluntly and the peritoneum was entered bluntly. Attention was turned to the lower uterine segment where a vertical hysterotomy was made with a scalpel and extended with bandage scissors. The infant was breech and hips were delivered first with subsequent delivery of legs and arms. Head was flexed and delivered successfully. The cord was clamped and cut and the infant was handed over to awaiting neonatology team. Uterine massage was then administered, and the placenta delivered intact with a three-vessel cord. The uterus was then cleared of clot and debris. The hysterotomy was closed with 0 Monocryl in a running locked fashion, and an imbricating layer was also placed with 0 monocyl. Two vertical mattress sitches were placed on the left edge of hysterotomy for hemostasis. The pelvis was cleared of all clot and debris and copious irrigated. Hemostasis was confirmed on all surfaces. The peritoneum and the muscles were reapproximated using 0 Vicryl running stitches. The fascia was then closed using 0 Vicryl in a running fashion. The subcutaneous layer was irrigated, then reapproximated with 2-0 plain gut interrupted stitches. Lower edge of skin incision was mobilized using cautery. The skin was closed with a 4-0 Vicryl subcuticular stitch. The patient tolerated the procedure well. Sponge, lap, instrument and needle counts were correct x 2. She was taken to the recovery room in stable condition.   Based on the vertical hysterotomy the patient will necessitate repeat C-Section at 36 weeks for future pregnancies.  Patient had an uncomplicated postpartum course. She was tolerating eating and ambulation well. Pain was well controlled at the time of discharged.   Physical Exam:  BP 126/59 mmHg  Pulse 70  Temp(Src) 98.1  F (36.7 C) (Oral)  Resp 18  Ht 5\' 1"  (1.549 m)  Wt 246 lb (111.585 kg)  BMI 46.51 kg/m2  SpO2 100%  LMP 08/04/2014 (Approximate)  Breastfeeding?  Unknown General: alert, cooperative and appears stated age Lochia: appropriate Uterine Fundus: firm Incision: healing well, no significant drainage, no dehiscence, no significant erythema DVT Evaluation: No evidence of DVT seen on physical exam.  Discharge Diagnoses: Term Pregnancy-delivered  Discharge Information: Date: 12/20/2014 Activity: pelvic rest Diet: routine Medications: PNV, Ibuprofen, Colace and Percocet Condition: stable Instructions: refer to practice specific booklet Discharge to: home Follow-up Information    Follow up with [facility] In 4 weeks.   Specialty:  Obstetrics and Gynecology   Why:  postpartum care and start contraception   Contact information:   [address] [place] [address] [phone]     Newborn Data: Live born female  Birth Weight: 1 lb 7.6 oz (669 g) APGAR: 1, 4  Home with mother.  Isa Rankin [facility] 12/20/2014, 6:33 AM

## 2014-12-21 NOTE — Progress Notes (Addendum)
Was referred to patient by RN.  Introduced myself and spiritual care services.  Pt was accompanied by her best friend and her mother.  Stated she was still feeling overwhelmed and trying to identify herself as a mother.  She also reported she was beginning to feel some pain.  Pt's mother and friend both stated they know she will be a good mother because she loves and is wonderful with children.  Pt's mother stated that she was sad because she missed the delivery.  Pt's step father had been with her overnight after her mother was working.  She was very tired and took some Benadryl to sleep.  States she woke up and everyone told her the news.  She could not believe she had missed it and was eager to meet the baby later this afternoon when MOB was recovered enough to go down to NICU.  Provided emotional and spiritual support to family.  Met family in NICU during first visit and explained that we will continue to follow the family and baby Zy'Rion in the NICU.  Will continue to follow  Melinda Richardson, [place]

## 2014-12-23 ENCOUNTER — Other Ambulatory Visit: Payer: Self-pay | Admitting: Certified Nurse Midwife

## 2015-01-27 ENCOUNTER — Ambulatory Visit (INDEPENDENT_AMBULATORY_CARE_PROVIDER_SITE_OTHER): Payer: Self-pay | Admitting: Obstetrics & Gynecology

## 2015-01-27 ENCOUNTER — Encounter: Payer: Self-pay | Admitting: Obstetrics & Gynecology

## 2015-01-27 NOTE — Patient Instructions (Signed)
Etonogestrel implant What is this medicine? ETONOGESTREL (et oh noe JES trel) is a contraceptive (birth control) device. It is used to prevent pregnancy. It can be used for up to 3 years. This medicine may be used for other purposes; ask your health care provider or pharmacist if you have questions. What should I tell my health care provider before I take this medicine? They need to know if you have any of these conditions: -abnormal vaginal bleeding -blood vessel disease or blood clots -cancer of the breast, cervix, or liver -depression -diabetes -gallbladder disease -headaches -heart disease or recent heart attack -high blood pressure -high cholesterol -kidney disease -liver disease -renal disease -seizures -tobacco smoker -an unusual or allergic reaction to etonogestrel, other hormones, anesthetics or antiseptics, medicines, foods, dyes, or preservatives -pregnant or trying to get pregnant -breast-feeding How should I use this medicine? This device is inserted just under the skin on the inner side of your upper arm by a health care professional. Talk to your pediatrician regarding the use of this medicine in children. Special care may be needed. Overdosage: If you think you have taken too much of this medicine contact a poison control center or emergency room at once. NOTE: This medicine is only for you. Do not share this medicine with others. What if I miss a dose? This does not apply. What may interact with this medicine? Do not take this medicine with any of the following medications: -amprenavir -bosentan -fosamprenavir This medicine may also interact with the following medications: -barbiturate medicines for inducing sleep or treating seizures -certain medicines for fungal infections like ketoconazole and itraconazole -griseofulvin -medicines to treat seizures like carbamazepine, felbamate, oxcarbazepine, phenytoin,  topiramate -modafinil -phenylbutazone -rifampin -some medicines to treat HIV infection like atazanavir, indinavir, lopinavir, nelfinavir, tipranavir, ritonavir -St. John's wort This list may not describe all possible interactions. Give your health care provider a list of all the medicines, herbs, non-prescription drugs, or dietary supplements you use. Also tell them if you smoke, drink alcohol, or use illegal drugs. Some items may interact with your medicine. What should I watch for while using this medicine? This product does not protect you against HIV infection (AIDS) or other sexually transmitted diseases. You should be able to feel the implant by pressing your fingertips over the skin where it was inserted. Contact your doctor if you cannot feel the implant, and use a non-hormonal birth control method (such as condoms) until your doctor confirms that the implant is in place. If you feel that the implant may have broken or become bent while in your arm, contact your healthcare provider. What side effects may I notice from receiving this medicine? Side effects that you should report to your doctor or health care professional as soon as possible: -allergic reactions like skin rash, itching or hives, swelling of the face, lips, or tongue -breast lumps -changes in emotions or moods -depressed mood -heavy or prolonged menstrual bleeding -pain, irritation, swelling, or bruising at the insertion site -scar at site of insertion -signs of infection at the insertion site such as fever, and skin redness, pain or discharge -signs of pregnancy -signs and symptoms of a blood clot such as breathing problems; changes in vision; chest pain; severe, sudden headache; pain, swelling, warmth in the leg; trouble speaking; sudden numbness or weakness of the face, arm or leg -signs and symptoms of liver injury like dark yellow or brown urine; general ill feeling or flu-like symptoms; light-colored stools; loss of  appetite; nausea; right upper belly   pain; unusually weak or tired; yellowing of the eyes or skin -unusual vaginal bleeding, discharge -signs and symptoms of a stroke like changes in vision; confusion; trouble speaking or understanding; severe headaches; sudden numbness or weakness of the face, arm or leg; trouble walking; dizziness; loss of balance or coordination Side effects that usually do not require medical attention (Report these to your doctor or health care professional if they continue or are bothersome.): -acne -back pain -breast pain -changes in weight -dizziness -general ill feeling or flu-like symptoms -headache -irregular menstrual bleeding -nausea -sore throat -vaginal irritation or inflammation This list may not describe all possible side effects. Call your doctor for medical advice about side effects. You may report side effects to FDA at 1-800-FDA-1088. Where should I keep my medicine? This drug is given in a hospital or clinic and will not be stored at home. NOTE: This sheet is a summary. It may not cover all possible information. If you have questions about this medicine, talk to your doctor, pharmacist, or health care provider.    2016, Elsevier/Gold Standard. (2014-01-09 14:07:06)  

## 2015-01-27 NOTE — Progress Notes (Signed)
Subjective:     Melinda Richardson is a 19 y.o. female who presents for a postpartum visit. She is 6 wks postpartum following a Cesarean delivery. I have fully reviewed the prenatal and intrapartum course. The delivery was at 24 gestational weeks. Outcome: viable female infant admitted to NICU. Anesthesia: spinal.  Postpartum course has been uncomplicated.  She reports some abdominal pain and numbness @ incision site.  Baby's course has been complicated - in NICU. Baby is feeding by orogastric tube with donor breastmilk. Bleeding normal . Bowel function is normal.  Bladder function is normal. Patient is not sexually active. Contraception method is desires Nexplanon. Postpartum depression screening: score - 1.   The following portions of the patient's history were reviewed and updated as appropriate: allergies, current medications, past family history, past medical history, past social history, past surgical history and problem list.  Review of Systems Pertinent items are noted in HPI.   Objective:    BP 114/63 mmHg  Pulse 63  Temp(Src) 98 F (36.7 C) (Oral)  Resp 20  Ht 5\' 5"  (1.651 m)  Wt 258 lb 3.2 oz (117.119 kg)  BMI 42.97 kg/m2  LMP 01/24/2015 (Exact Date)  General:  alert, cooperative and no distress           Abdomen: soft, non-tender; bowel sounds normal; no masses,  no organomegaly and incision well healed   Vulva:  not evaluated                       Assessment:     normal  postpartum exam. Pap smear not done at today's visit.   Plan:    1. Contraception: Nexplanon and will schedule 2. 3. Follow up in: 2 week or as needed.    Adam PhenixJames G Arnold, MD 01/27/2015

## 2015-02-15 ENCOUNTER — Ambulatory Visit: Payer: Medicaid Other | Admitting: Obstetrics & Gynecology
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
## 2015-03-01 ENCOUNTER — Encounter (HOSPITAL_COMMUNITY): Payer: Self-pay | Admitting: Obstetrics and Gynecology

## 2015-08-05 ENCOUNTER — Other Ambulatory Visit (HOSPITAL_COMMUNITY)
Admission: RE | Admit: 2015-08-05 | Discharge: 2015-08-05 | Disposition: A | Discharge status: Home / Self Care | Payer: Medicaid Other | Source: Ambulatory Visit | Attending: Obstetrics and Gynecology | Admitting: Obstetrics and Gynecology

## 2015-08-05 ENCOUNTER — Ambulatory Visit (INDEPENDENT_AMBULATORY_CARE_PROVIDER_SITE_OTHER): Payer: Medicaid Other | Admitting: Obstetrics and Gynecology

## 2015-08-05 VITALS — BP 129/65 | HR 78 | Wt 262.3 lb

## 2015-08-05 DIAGNOSIS — Z113 Encounter for screening for infections with a predominantly sexual mode of transmission: Secondary | ICD-10-CM

## 2015-08-05 DIAGNOSIS — N760 Acute vaginitis: Secondary | ICD-10-CM

## 2015-08-05 NOTE — Progress Notes (Signed)
Patient ID: Melinda Richardson, female   DOB: 11-Feb-1996, 20 y.o.   MRN: [id number] 20 yo presenting today for the evaluation of vaginal discomfort. Patient reports onset of a vaginal discharge 1 month ago which has now been associated with dysuria. She denies any pruritis or odor. She is sexually active without contraception. She denies any abdominal or pelvic pain  Past Medical History  Diagnosis Date  . Medical history non-contributory    Past Surgical History  Procedure Laterality Date  . Cesarean section classical  12/17/2014    at 2049w2d  . Cesarean section N/A 12/17/2014    Procedure: CESAREAN SECTION;  Surgeon: Tilda BurrowJohn Ferguson V, MD;  Location: [facility];  Service: Obstetrics;  Laterality: N/A;  . Cesarean section  12/17/14    PTL @ 24 wks - footling breech  . Tonsillectomy     Family History  Problem Relation Age of Onset  . Kidney failure Father   . Diabetes Mother    Social History  Substance Use Topics  . Smoking status: Never Smoker   . Smokeless tobacco: Not on file  . Alcohol Use: No   ROS See pertinent in HPI  Blood pressure 129/65, pulse 78, weight 262 lb 4.8 oz (118.978 kg), unknown if currently breastfeeding. GENERAL: Well-developed, well-nourished female in no acute distress.  ABDOMEN: Soft, nontender, nondistended. obese PELVIC: Normal external female genitalia. Vagina is pink and rugated.  Normal discharge. Normal appearing cervix. Uterus is normal in size. No adnexal mass or tenderness. EXTREMITIES: No cyanosis, clubbing, or edema, 2+ distal pulses.  A/P 20 yo with vaginitis - wet prep collected - GC/Chl also ordered - Urine culture ordered - Patient will be contacted with any abnormal results - RTC prn

## 2015-08-06 LAB — WET PREP, GENITAL
Clue Cells Wet Prep HPF POC: NONE SEEN
YEAST WET PREP: NONE SEEN

## 2015-08-06 LAB — GC/CHLAMYDIA PROBE AMP (~~LOC~~) NOT AT ARMC
CHLAMYDIA, DNA PROBE: NEGATIVE
Neisseria Gonorrhea: NEGATIVE

## 2015-08-07 ENCOUNTER — Other Ambulatory Visit: Payer: Self-pay | Admitting: Obstetrics and Gynecology

## 2015-08-07 LAB — URINE CULTURE: Colony Count: >=100000

## 2015-08-07 MED ORDER — METRONIDAZOLE 500 MG PO TABS: 500.0000 mg | ORAL_TABLET | Freq: Two times a day (BID) | ORAL | Status: AC

## 2015-08-07 MED ORDER — CEPHALEXIN 500 MG PO CAPS: 500.0000 mg | ORAL_CAPSULE | Freq: Three times a day (TID) | ORAL | Status: DC

## 2015-08-09 ENCOUNTER — Telehealth: Payer: Self-pay

## 2015-08-09 NOTE — Telephone Encounter (Signed)
Please inform the patient of UTI and trich infection. Both antibiotics have been e-prescribed. Please have the patient inform her partner so that he can also be treated. They should both abstain until treatment completion.  I have called patient and left a message concerning lab results and for her to us back

## 2015-08-11 NOTE — Telephone Encounter (Signed)
Left message for patient to call back  

## 2015-08-12 ENCOUNTER — Other Ambulatory Visit: Payer: Self-pay

## 2015-08-12 NOTE — Telephone Encounter (Signed)
Pt has been informed of lab results. 

## 2016-06-13 ENCOUNTER — Ambulatory Visit (HOSPITAL_COMMUNITY)
Admission: EM | Admit: 2016-06-13 | Discharge: 2016-06-13 | Disposition: A | Discharge status: Home / Self Care | Payer: Medicaid Other | Attending: Family Medicine | Admitting: Family Medicine

## 2016-06-13 ENCOUNTER — Encounter (HOSPITAL_COMMUNITY): Payer: Self-pay | Admitting: Family Medicine

## 2016-06-13 DIAGNOSIS — B9789 Other viral agents as the cause of diseases classified elsewhere: Secondary | ICD-10-CM | POA: Diagnosis not present

## 2016-06-13 DIAGNOSIS — J069 Acute upper respiratory infection, unspecified: Secondary | ICD-10-CM

## 2016-06-13 MED ORDER — BENZONATATE 100 MG PO CAPS: 100.0000 mg | ORAL_CAPSULE | Freq: Three times a day (TID) | ORAL | 0 refills | Status: DC

## 2016-06-13 MED ORDER — IPRATROPIUM BROMIDE 0.06 % NA SOLN: 2.0000 | Freq: Four times a day (QID) | NASAL | 0 refills | Status: DC

## 2016-06-13 NOTE — ED Provider Notes (Signed)
CSN: [id number]     Arrival date & time 06/13/16  1332 History   First MD Initiated Contact with Patient 06/13/16 1446     Chief Complaint  Patient presents with  . URI   (Consider location/radiation/quality/duration/timing/severity/associated sxs/prior Treatment) Patient c/o uri sx's for 3 days   The history is provided by the patient.  URI  Presenting symptoms: congestion and cough   Cough:    Cough characteristics:  Productive   Sputum characteristics:  White   Severity:  Mild   Onset quality:  Sudden   Duration:  3 days   Timing:  Constant   Chronicity:  New Severity:  Mild Onset quality:  Sudden Duration:  3 days Timing:  Constant Chronicity:  New Relieved by:  Nothing Worsened by:  Nothing Ineffective treatments:  None tried   Past Medical History:  Diagnosis Date  . Medical history non-contributory    Past Surgical History:  Procedure Laterality Date  . CESAREAN SECTION N/A 12/17/2014   Procedure: CESAREAN SECTION;  Surgeon: [name], MD;  Location: [facility];  Service: Obstetrics;  Laterality: N/A;  . CESAREAN SECTION  12/17/14   PTL @ 24 wks - footling breech  . CESAREAN SECTION CLASSICAL  12/17/2014   at [redacted]w[redacted]d  . TONSILLECTOMY     Family History  Problem Relation Age of Onset  . Kidney failure Father   . Diabetes Mother    Social History  Substance Use Topics  . Smoking status: Never Smoker  . Smokeless tobacco: Never Used  . Alcohol use No   OB History    Gravida Para Term Preterm AB Living   2 1 0 1 0 1   SAB TAB Ectopic Multiple Live Births   0 0 0   1     Review of Systems  Constitutional: Negative.   HENT: Positive for congestion.   Eyes: Negative.   Respiratory: Positive for cough.   Cardiovascular: Negative.   Gastrointestinal: Negative.   Endocrine: Negative.   Genitourinary: Negative.   Musculoskeletal: Negative.   Skin: Negative.   Allergic/Immunologic: Negative.   Hematological: Negative.   Psychiatric/Behavioral:  Negative.     Allergies  Patient has no known allergies.  Home Medications   Prior to Admission medications   Medication Sig Start Date End Date Taking? Authorizing Provider  benzonatate (TESSALON) 100 MG capsule Take 1 capsule (100 mg total) by mouth every 8 (eight) hours. 06/13/16   Deatra Canter, FNP  ipratropium (ATROVENT) 0.06 % nasal spray Place 2 sprays into both nostrils 4 (four) times daily. 06/13/16   Deatra Canter, FNP   Meds Ordered and Administered this Visit  Medications - No data to display  BP 130/91   Pulse 106   Temp 98.6 F (37 C) (Oral)   Resp 18   LMP 05/16/2016   SpO2 100%  No data found.   Physical Exam  Constitutional: She appears well-developed and well-nourished.  HENT:  Head: Normocephalic and atraumatic.  Right Ear: External ear normal.  Left Ear: External ear normal.  Mouth/Throat: Oropharynx is clear and moist.  Eyes: Conjunctivae are normal. Pupils are equal, round, and reactive to light.  Neck: Normal range of motion.  Cardiovascular: Normal rate, regular rhythm and normal heart sounds.   Pulmonary/Chest: Effort normal and breath sounds normal.  Abdominal: Soft. Bowel sounds are normal.  Musculoskeletal: Normal range of motion.  Nursing note and vitals reviewed.   Urgent Care Course     Procedures (including critical care  time)  Labs Review Labs Reviewed - No data to display  Imaging Review No results found.   Visual Acuity Review  Right Eye Distance:   Left Eye Distance:   Bilateral Distance:    Right Eye Near:   Left Eye Near:    Bilateral Near:         MDM   1. Viral URI with cough    Atrovent nasal spray Tessalon Perles 100mg  po tid prn  Push po fluids, rest, tylenol and motrin otc prn as directed for fever, arthralgias, and myalgias.  Follow up prn if sx's continue or persist.    Deatra CanterWilliam J Sanjay Broadfoot, FNP 06/13/16 [phone]

## 2016-06-13 NOTE — ED Triage Notes (Signed)
Pt here for URI symptoms since Saturday.  

## 2016-06-18 ENCOUNTER — Emergency Department (HOSPITAL_COMMUNITY): Payer: Self-pay

## 2016-06-18 ENCOUNTER — Emergency Department (HOSPITAL_COMMUNITY)
Admission: EM | Admit: 2016-06-18 | Discharge: 2016-06-18 | Disposition: A | Discharge status: Home / Self Care | Payer: Self-pay | Attending: Emergency Medicine | Admitting: Emergency Medicine

## 2016-06-18 ENCOUNTER — Encounter (HOSPITAL_COMMUNITY): Payer: Self-pay | Admitting: *Deleted

## 2016-06-18 DIAGNOSIS — J069 Acute upper respiratory infection, unspecified: Secondary | ICD-10-CM | POA: Insufficient documentation

## 2016-06-18 DIAGNOSIS — B9789 Other viral agents as the cause of diseases classified elsewhere: Secondary | ICD-10-CM

## 2016-06-18 MED ORDER — ALBUTEROL SULFATE HFA 108 (90 BASE) MCG/ACT IN AERS: 1.0000 | INHALATION_SPRAY | Freq: Four times a day (QID) | RESPIRATORY_TRACT | 0 refills | Status: DC | PRN

## 2016-06-18 MED ORDER — IPRATROPIUM-ALBUTEROL 0.5-2.5 (3) MG/3ML IN SOLN
3.0000 mL | Freq: Once | RESPIRATORY_TRACT | Status: AC
  Administered 2016-06-18: 3 mL via RESPIRATORY_TRACT
  Filled 2016-06-18: qty 3

## 2016-06-18 MED ORDER — FLUTICASONE PROPIONATE 50 MCG/ACT NA SUSP: 2.0000 | Freq: Every day | NASAL | 12 refills | Status: DC

## 2016-06-18 MED ORDER — GUAIFENESIN-CODEINE 100-10 MG/5ML PO SYRP: 5.0000 mL | ORAL_SOLUTION | Freq: Three times a day (TID) | ORAL | 0 refills | Status: DC | PRN

## 2016-06-18 NOTE — ED Notes (Signed)
Declined W/C at D/C and was escorted to lobby by RN. 

## 2016-06-18 NOTE — ED Triage Notes (Signed)
Pt states she was tx at an UC 6 days ago and given a prescription for tessalon pearls and albuterol nasal spray.  She's only been taking the pills b/c the spray was too expensive.  States cough continues.  Appears in no distress.  VS stable.

## 2016-06-18 NOTE — Discharge Instructions (Signed)
This is likely a viral illness they can take 7-10 days to resolve. If symptoms persist after 10 days may need antibiotics for bacterial infection. Use the Flonase for nasal congestion. He is a cough medicine. This make you drowsy and cannot drive with that. Use hot steam from warm shower for nasal congestion. May use albuterol inhaler if wheezing or cough. Follow up with primary care doctor. Return if he developed chest pain or shortness of breath.

## 2016-06-18 NOTE — ED Provider Notes (Signed)
[facility]-EMERGENCY DEPT Provider Note   CSN: [id number] Arrival date & time: 06/18/16  1451   By signing my name below, I, Freida Busman, attest that this documentation has been prepared under the direction and in the presence of Demetrios Loll, PA-C. Electronically Signed: Freida Busman, Scribe. 06/18/2016. 3:57 PM.   History   Chief Complaint Chief Complaint  Patient presents with  . URI    The history is provided by the patient. No language interpreter was used.    HPI Comments:  Melinda Richardson is a 21 y.o. female who presents to the Emergency Department complaining of productive cough x ~ 1 week. She reports associated sore throat, congestion, and rhinorrhea. She also notes mild frontal HA with mild sinus pressure. She denies  fever, sinus pressure, Otalgia, nausea, emesis, diarrhea, abdominal pain and body aches. Pt went to Urgent Care 5 days ago and was diagnosed with a URI. She was discharged with Rx for tessalon perles and Atrovent nasal spray but couldn't afford the prescribed nasal spray and states the tessalon has not provided relief.  No history of PE, DVT, exogenous estrogen use, recent surgery,  hospitalization or prolonged travel. No lower extremity swelling or pain.    Past Medical History:  Diagnosis Date  . Medical history non-contributory     Patient Active Problem List   Diagnosis Date Noted  . S/P emergency C-section 12/17/2014  . Preterm premature rupture of membranes (PPROM) with unknown onset of labor 12/11/2014  . Cervical incompetence during pregnancy in second trimester 12/09/2014  . No prenatal care in current pregnancy in second trimester 12/09/2014  . Trichomonal vaginitis during pregnancy in second trimester 12/09/2014    Past Surgical History:  Procedure Laterality Date  . CESAREAN SECTION N/A 12/17/2014   Procedure: CESAREAN SECTION;  Surgeon: [name], MD;  Location: [facility];  Service: Obstetrics;  Laterality: N/A;  . CESAREAN SECTION   12/17/14   PTL @ 24 wks - footling breech  . CESAREAN SECTION CLASSICAL  12/17/2014   at [redacted]w[redacted]d  . TONSILLECTOMY      OB History    Gravida Para Term Preterm AB Living   2 1 0 1 0 1   SAB TAB Ectopic Multiple Live Births   0 0 0   1       Home Medications    Prior to Admission medications   Medication Sig Start Date End Date Taking? Authorizing Provider  benzonatate (TESSALON) 100 MG capsule Take 1 capsule (100 mg total) by mouth every 8 (eight) hours. 06/13/16   Deatra Canter, FNP  ipratropium (ATROVENT) 0.06 % nasal spray Place 2 sprays into both nostrils 4 (four) times daily. 06/13/16   Deatra Canter, FNP    Family History Family History  Problem Relation Age of Onset  . Kidney failure Father   . Diabetes Mother     Social History Social History  Substance Use Topics  . Smoking status: Never Smoker  . Smokeless tobacco: Never Used  . Alcohol use No     Allergies   Patient has no known allergies.   Review of Systems Review of Systems  Constitutional: Negative for chills and fever.  HENT: Positive for congestion, rhinorrhea and sore throat. Negative for sinus pain and sinus pressure.   Respiratory: Positive for cough. Negative for shortness of breath.   Cardiovascular: Negative for chest pain.  Musculoskeletal: Negative for myalgias.  Neurological: Positive for headaches.     Physical Exam Updated Vital Signs BP  132/74 (BP Location: Right Arm)   Pulse 79   Temp 98.3 F (36.8 C) (Oral)   Resp 16   Ht 5\' 5"  (1.651 m)   Wt 238 lb (108 kg)   LMP 06/13/2016   SpO2 99%   BMI 39.61 kg/m   Physical Exam  Constitutional: She is oriented to person, place, and time. She appears well-developed and well-nourished. No distress.  Non-toxic appearing  HENT:  Head: Normocephalic and atraumatic.  Right Ear: Tympanic membrane, external ear and ear canal normal.  Left Ear: Tympanic membrane, external ear and ear canal normal.  Nose: Mucosal edema and rhinorrhea  present. Right sinus exhibits frontal sinus tenderness. Right sinus exhibits no maxillary sinus tenderness. Left sinus exhibits frontal sinus tenderness. Left sinus exhibits no maxillary sinus tenderness.  Mouth/Throat: Uvula is midline, oropharynx is clear and moist and mucous membranes are normal. No trismus in the jaw. No tonsillar exudate.  Eyes: Conjunctivae and EOM are normal. Pupils are equal, round, and reactive to light.  Neck: Normal range of motion. Neck supple.  Cardiovascular: Normal rate and regular rhythm.   Pulmonary/Chest: Effort normal. She has wheezes. She exhibits no tenderness.  Scattered wheezing throughout No course sounds Patient is not hypoxic or tachypnea.  Abdominal: She exhibits no distension.  Musculoskeletal: Normal range of motion.  No lower extremity edema or calf tenderness.  Lymphadenopathy:    She has no cervical adenopathy.  Neurological: She is alert and oriented to person, place, and time.  Skin: Skin is warm and dry. Capillary refill takes less than 2 seconds.  Psychiatric: She has a normal mood and affect.  Nursing note and vitals reviewed.    ED Treatments / Results  DIAGNOSTIC STUDIES:  Oxygen Saturation is 99% on RA, normal by my interpretation.    COORDINATION OF CARE:  3:53 PM Discussed treatment plan with pt at bedside and pt agreed to plan.  Labs (all labs ordered are listed, but only abnormal results are displayed) Labs Reviewed - No data to display  EKG  EKG Interpretation None       Radiology No results found.  Procedures Procedures (including critical care time)  Medications Ordered in ED Medications  ipratropium-albuterol (DUONEB) 0.5-2.5 (3) MG/3ML nebulizer solution 3 mL (3 mLs Nebulization Given 06/18/16 1608)     Initial Impression / Assessment and Plan / ED Course  I have reviewed the triage vital signs and the nursing notes.  Pertinent labs & imaging results that were available during my care of the  patient were reviewed by me and considered in my medical decision making (see chart for details).     Patient presents to the ED with complaints of rhinorrhea, sinus congestion, sore throat, productive cough. Was seen in urgent care 6 days ago for URI. Given Tessalon without relief. Unable to afford the Atrovent nasal spray. Did not have x-ray performed at urgent care. He continues to have productive cough without any fevers. Patient is PERC negative. Denies chest pain or shortness of breath. Clinical presentation is not consistent with PE, pneumonia, ACS. Pt symptoms seems consistent with URI. Awaiting chest x-ray. Patient given DuoNeb in the ED with improvement in her wheezing and cough. Oxygen saturations remain normal and she is not tachypnea. Symptoms have been persistent for 7 days. No significant sinus tenderness. Did not feel this is bacterial sinusitis. We'll give prescription for Flonase, cough syrup. Encouraged symptomatically treatment. Encouraged to follow up with PCP if symptoms not improved for possible need for antibiotics. Awaiting chest  x-ray. Chest is normal will discharge. Care hand off to PA [facility].   Final Clinical Impressions(s) / ED Diagnoses   Final diagnoses:  Viral URI with cough    New Prescriptions New Prescriptions   ALBUTEROL (PROVENTIL HFA;VENTOLIN HFA) 108 (90 BASE) MCG/ACT INHALER    Inhale 1-2 puffs into the lungs every 6 (six) hours as needed for wheezing or shortness of breath.   FLUTICASONE (FLONASE) 50 MCG/ACT NASAL SPRAY    Place 2 sprays into both nostrils daily.   GUAIFENESIN-CODEINE (ROBITUSSIN AC) 100-10 MG/5ML SYRUP    Take 5 mLs by mouth 3 (three) times daily as needed for cough.   I personally performed the services described in this documentation, which was scribed in my presence. The recorded information has been reviewed and is accurate.     Rise Mu, PA-C 06/18/16 1709    Arby Barrette, MD 06/18/16 [phone]

## 2016-07-18 ENCOUNTER — Emergency Department (HOSPITAL_COMMUNITY): Payer: Self-pay

## 2016-07-18 ENCOUNTER — Encounter (HOSPITAL_COMMUNITY): Payer: Self-pay | Admitting: Emergency Medicine

## 2016-07-18 ENCOUNTER — Emergency Department (HOSPITAL_COMMUNITY)
Admission: EM | Admit: 2016-07-18 | Discharge: 2016-07-18 | Disposition: A | Discharge status: Home / Self Care | Payer: Self-pay | Attending: Emergency Medicine | Admitting: Emergency Medicine

## 2016-07-18 DIAGNOSIS — M175 Other unilateral secondary osteoarthritis of knee: Secondary | ICD-10-CM | POA: Insufficient documentation

## 2016-07-18 DIAGNOSIS — Y929 Unspecified place or not applicable: Secondary | ICD-10-CM | POA: Insufficient documentation

## 2016-07-18 DIAGNOSIS — W182XXA Fall in (into) shower or empty bathtub, initial encounter: Secondary | ICD-10-CM | POA: Insufficient documentation

## 2016-07-18 DIAGNOSIS — Y939 Activity, unspecified: Secondary | ICD-10-CM | POA: Insufficient documentation

## 2016-07-18 DIAGNOSIS — Z79899 Other long term (current) drug therapy: Secondary | ICD-10-CM | POA: Insufficient documentation

## 2016-07-18 DIAGNOSIS — M25562 Pain in left knee: Secondary | ICD-10-CM

## 2016-07-18 DIAGNOSIS — Y999 Unspecified external cause status: Secondary | ICD-10-CM | POA: Insufficient documentation

## 2016-07-18 DIAGNOSIS — G8929 Other chronic pain: Secondary | ICD-10-CM | POA: Insufficient documentation

## 2016-07-18 NOTE — Discharge Instructions (Signed)
Use ice on the sore spots, several times a day for 3 or 4 days.  Use the knee sleeve as needed for comfort.  Follow-up with the orthopedic doctor or a primary care doctor for ongoing management of your discomfort.  For pain, use ibuprofen 400 mg 3 times a day.

## 2016-07-18 NOTE — ED Triage Notes (Signed)
Patient states that her left knee is been having pain for about year. Last night while in shower patient felt pop in left knee and having increased pain. Patient ambulatory from lobby to treatment room. Patient states has some swelling to left knee.

## 2016-07-18 NOTE — ED Provider Notes (Signed)
[facility]-EMERGENCY DEPT Provider Note   CSN: [id number] Arrival date & time: 07/18/16  0741     History   Chief Complaint Chief Complaint  Patient presents with  . Knee Pain    HPI Melinda Richardson is a 21 y.o. female.  She presents for evaluation of left knee pain, ongoing/chronic, worsening over the last 2 days after twisting it, while in the shower.  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  No other known traumas.  No fever, chills, nausea or vomiting.  There are no other known modifying factors.  HPI  Past Medical History:  Diagnosis Date  . Medical history non-contributory     Patient Active Problem List   Diagnosis Date Noted  . S/P emergency C-section 12/17/2014  . Preterm premature rupture of membranes (PPROM) with unknown onset of labor 12/11/2014  . Cervical incompetence during pregnancy in second trimester 12/09/2014  . No prenatal care in current pregnancy in second trimester 12/09/2014  . Trichomonal vaginitis during pregnancy in second trimester 12/09/2014    Past Surgical History:  Procedure Laterality Date  . CESAREAN SECTION N/A 12/17/2014   Procedure: CESAREAN SECTION;  Surgeon: [name], MD;  Location: [facility];  Service: Obstetrics;  Laterality: N/A;  . CESAREAN SECTION  12/17/14   PTL @ 24 wks - footling breech  . CESAREAN SECTION CLASSICAL  12/17/2014   at [redacted]w[redacted]d  . TONSILLECTOMY      OB History    Gravida Para Term Preterm AB Living   2 1 0 1 0 1   SAB TAB Ectopic Multiple Live Births   0 0 0   1       Home Medications    Prior to Admission medications   Medication Sig Start Date End Date Taking? Authorizing Provider  albuterol (PROVENTIL HFA;VENTOLIN HFA) 108 (90 Base) MCG/ACT inhaler Inhale 1-2 puffs into the lungs every 6 (six) hours as needed for wheezing or shortness of breath. 06/18/16   Rise Mu, PA-C  benzonatate (TESSALON) 100 MG capsule Take 1 capsule (100 mg total) by mouth every 8  (eight) hours. 06/13/16   Deatra Canter, FNP  fluticasone (FLONASE) 50 MCG/ACT nasal spray Place 2 sprays into both nostrils daily. 06/18/16   Rise Mu, PA-C  guaiFENesin-codeine (ROBITUSSIN AC) 100-10 MG/5ML syrup Take 5 mLs by mouth 3 (three) times daily as needed for cough. 06/18/16   Rise Mu, PA-C  ipratropium (ATROVENT) 0.06 % nasal spray Place 2 sprays into both nostrils 4 (four) times daily. 06/13/16   Deatra Canter, FNP    Family History Family History  Problem Relation Age of Onset  . Kidney failure Father   . Diabetes Mother     Social History Social History  Substance Use Topics  . Smoking status: Never Smoker  . Smokeless tobacco: Never Used  . Alcohol use No     Allergies   Patient has no known allergies.   Review of Systems Review of Systems  All other systems reviewed and are negative.    Physical Exam Updated Vital Signs BP 133/67 (BP Location: Right Arm)   Pulse 89   Temp 98 F (36.7 C) (Oral)   Resp 18   LMP 06/13/2016   SpO2 97%   Physical Exam  Constitutional: She is oriented to person, place, and time. She appears well-developed.  Overweight  HENT:  Head: Normocephalic and atraumatic.  Eyes: Conjunctivae and EOM are normal. Pupils  are equal, round, and reactive to light.  Neck: Normal range of motion and phonation normal. Neck supple.  Cardiovascular: Normal rate.   Pulmonary/Chest: Effort normal. She exhibits no tenderness.  Musculoskeletal:  Somewhat decreased range of motion left knee, secondary to pain.  Left knee grossly stable.  No palpable left knee effusion.  Neurovascular intact distally.  Neurological: She is alert and oriented to person, place, and time. She exhibits normal muscle tone.  Skin: Skin is warm and dry.  Psychiatric: She has a normal mood and affect. Her behavior is normal. Judgment and thought content normal.  Nursing note and vitals reviewed.    ED Treatments / Results  Labs (all labs  ordered are listed, but only abnormal results are displayed) Labs Reviewed - No data to display  EKG  EKG Interpretation None       Radiology Dg Knee Complete 4 Views Left  Result Date: 07/18/2016 CLINICAL DATA:  Left knee pain for 1 year, fell last night with increased pain EXAM: LEFT KNEE - COMPLETE 4+ VIEW COMPARISON:  None. FINDINGS: For age there is advanced bicompartmental degenerative joint disease involving both the medial and lateral compartments. There is spurring from the lateral compartment with some loss of medial compartment joint space. The patellofemoral compartment appears within normal limits. No fracture seen and no joint effusion is noted. IMPRESSION: Age advanced bicompartmental degenerative joint disease involving medial and lateral compartments. Electronically Signed   By: Dwyane Dee M.D.   On: 07/18/2016 08:15    Procedures Procedures (including critical care time)  Medications Ordered in ED Medications - No data to display   Initial Impression / Assessment and Plan / ED Course  I have reviewed the triage vital signs and the nursing notes.  Pertinent labs & imaging results that were available during my care of the patient were reviewed by me and considered in my medical decision making (see chart for details).     Medications - No data to display  Patient Vitals for the past 24 hrs:  BP Temp Temp src Pulse Resp SpO2  07/18/16 0748 133/67 98 F (36.7 C) Oral 89 18 97 %    8:43 AM Reevaluation with update and discussion. After initial assessment and treatment, an updated evaluation reveals findings discussed with patient, all questions answered. Texanna Hilburn L    Final Clinical Impressions(s) / ED Diagnoses   Final diagnoses:  Acute pain of left knee  Other secondary osteoarthritis of left knee   Left knee strain, with arthritic changes left knee.  Doubt significant intra-articular injury.  Doubt fracture.  Nursing Notes Reviewed/ Care  Coordinated Applicable Imaging Reviewed Interpretation of Laboratory Data incorporated into ED treatment  The patient appears reasonably screened and/or stabilized for discharge and I doubt any other medical condition or other [facility] requiring further screening, evaluation, or treatment in the ED at this time prior to discharge.  Plan: Home Medications- IBU; Home Treatments- rest, knee sleeve prn; return here if the recommended treatment, does not improve the symptoms; Recommended follow up- ORTHO 1-2 weeks if needed, or PCP f/u.   New Prescriptions New Prescriptions   No medications on file     Mancel Bale, MD 07/18/16 [phone]

## 2016-08-25 ENCOUNTER — Ambulatory Visit: Payer: Self-pay

## 2016-09-21 ENCOUNTER — Emergency Department (HOSPITAL_COMMUNITY)
Admission: EM | Admit: 2016-09-21 | Discharge: 2016-09-21 | Discharge status: Against Medical Advice | Payer: Self-pay | Attending: Emergency Medicine | Admitting: Emergency Medicine

## 2016-09-21 ENCOUNTER — Encounter (HOSPITAL_COMMUNITY): Payer: Self-pay | Admitting: Emergency Medicine

## 2016-09-21 DIAGNOSIS — R1084 Generalized abdominal pain: Secondary | ICD-10-CM | POA: Insufficient documentation

## 2016-09-21 LAB — I-STAT BETA HCG BLOOD, ED (MC, WL, AP ONLY)

## 2016-09-21 LAB — CBC
HCT: 32.2 % — ABNORMAL LOW (ref 36.0–46.0)
Hemoglobin: 10.7 g/dL — ABNORMAL LOW (ref 12.0–15.0)
MCH: 28.2 pg (ref 26.0–34.0)
MCHC: 33.2 g/dL (ref 30.0–36.0)
MCV: 85 fL (ref 78.0–100.0)
PLATELETS: 236 10*3/uL (ref 150–400)
RBC: 3.79 MIL/uL — ABNORMAL LOW (ref 3.87–5.11)
RDW: 14.1 % (ref 11.5–15.5)
WBC: 5.1 10*3/uL (ref 4.0–10.5)

## 2016-09-21 LAB — COMPREHENSIVE METABOLIC PANEL
ALBUMIN: 3.8 g/dL (ref 3.5–5.0)
ALK PHOS: 38 U/L (ref 38–126)
ALT: 17 U/L (ref 14–54)
AST: 16 U/L (ref 15–41)
Anion gap: 7 (ref 5–15)
BILIRUBIN TOTAL: 0.2 mg/dL — AB (ref 0.3–1.2)
BUN: 8 mg/dL (ref 6–20)
CALCIUM: 8.9 mg/dL (ref 8.9–10.3)
CO2: 24 mmol/L (ref 22–32)
CREATININE: 0.71 mg/dL (ref 0.44–1.00)
Chloride: 104 mmol/L (ref 101–111)
GFR calc non Af Amer: >60 mL/min (ref >60)
GLUCOSE: 110 mg/dL — AB (ref 65–99)
Potassium: 3.3 mmol/L — ABNORMAL LOW (ref 3.5–5.1)
SODIUM: 135 mmol/L (ref 135–145)
Total Protein: 7.4 g/dL (ref 6.5–8.1)

## 2016-09-21 LAB — LIPASE, BLOOD: Lipase: 17 U/L (ref 11–51)

## 2016-09-21 NOTE — ED Notes (Signed)
Attempted to call patient for an assigned room. No answer.  

## 2016-09-21 NOTE — ED Notes (Signed)
Bed: [id number] Expected date:  Expected time:  Means of arrival:  Comments: TCU 30

## 2016-09-21 NOTE — ED Triage Notes (Signed)
Patient c/o LLQ pain that is been intermittent over past 2 months. Patient denies n/v or constipation. Patient reports that she had diarrhea couple days ago but not when adb pain was going on. Patient wants to be tested for STDs. Patient states that she was seen at doctor around Feb and was positive for STD and took medication but didn't get refill so unsure if she full cleared up STD.  Patient denies urinary problems or discharge.

## 2016-09-21 NOTE — ED Notes (Signed)
Pt called for room placement without answer x1

## 2016-10-02 ENCOUNTER — Inpatient Hospital Stay (HOSPITAL_COMMUNITY)
Admission: AD | Admit: 2016-10-02 | Discharge: 2016-10-02 | Disposition: A | Discharge status: Home Health | Payer: Self-pay | Source: Ambulatory Visit | Attending: Obstetrics and Gynecology | Admitting: Obstetrics and Gynecology

## 2016-10-02 NOTE — MAU Note (Signed)
Pt not in lobby.  

## 2016-10-03 ENCOUNTER — Encounter: Payer: Self-pay | Admitting: Family Medicine

## 2016-10-03 ENCOUNTER — Ambulatory Visit (INDEPENDENT_AMBULATORY_CARE_PROVIDER_SITE_OTHER): Payer: Self-pay

## 2016-10-03 DIAGNOSIS — Z3201 Encounter for pregnancy test, result positive: Secondary | ICD-10-CM

## 2016-10-03 NOTE — Progress Notes (Signed)
Patient presented to the office today for pregnancy test. Test confirms she is pregnant around 6 weeks. Patient has been advised to start taking prenatal vitamins and to always call before taking any other medications. A letter of verification has been given to patient.

## 2016-10-04 LAB — POCT PREGNANCY, URINE: Preg Test, Ur: POSITIVE — AB

## 2016-10-10 ENCOUNTER — Encounter: Payer: Self-pay | Admitting: Family Medicine

## 2016-10-10 ENCOUNTER — Ambulatory Visit (INDEPENDENT_AMBULATORY_CARE_PROVIDER_SITE_OTHER): Payer: Self-pay | Admitting: *Deleted

## 2016-10-10 DIAGNOSIS — N898 Other specified noninflammatory disorders of vagina: Secondary | ICD-10-CM

## 2016-10-10 NOTE — Progress Notes (Signed)
Pt reports fishy smelling vaginal discharge.

## 2016-10-12 LAB — CERVICOVAGINAL ANCILLARY ONLY
Bacterial vaginitis: POSITIVE — AB
Candida vaginitis: NEGATIVE
Chlamydia: NEGATIVE
NEISSERIA GONORRHEA: NEGATIVE
Trichomonas: NEGATIVE

## 2016-10-13 ENCOUNTER — Other Ambulatory Visit: Payer: Self-pay

## 2016-10-13 MED ORDER — METRONIDAZOLE 500 MG PO TABS: 500.0000 mg | ORAL_TABLET | Freq: Two times a day (BID) | ORAL | 0 refills | Status: DC

## 2016-10-13 NOTE — Telephone Encounter (Signed)
-----   Message from Levie HeritageJacob J Stinson, DO sent at 10/12/2016  3:46 PM EDT ----- Patient has BV - please treat per protocol.

## 2016-10-13 NOTE — Telephone Encounter (Signed)
Called patient inform her of BV results and need to be treat. Rx called into [facility]

## 2016-10-15 IMAGING — US US MFM OB DETAIL+14 WK
1 series · 14 of 28 positions shown · non-contrast
Comparison: none

[Series 1: us mfm ob detail+14 wk · 14 of 44 slices shown]
[im 2/44]
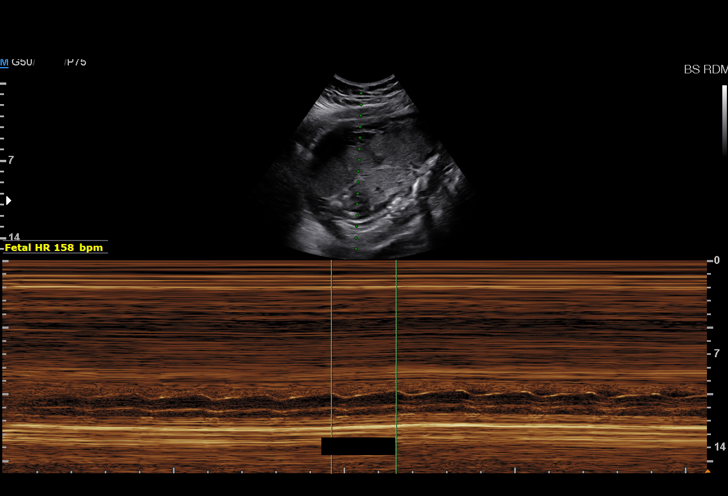
[im 5/44]
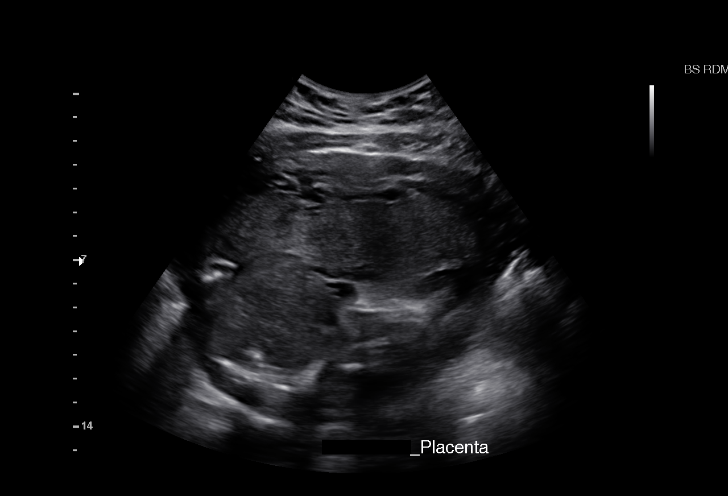
[im 8/44]
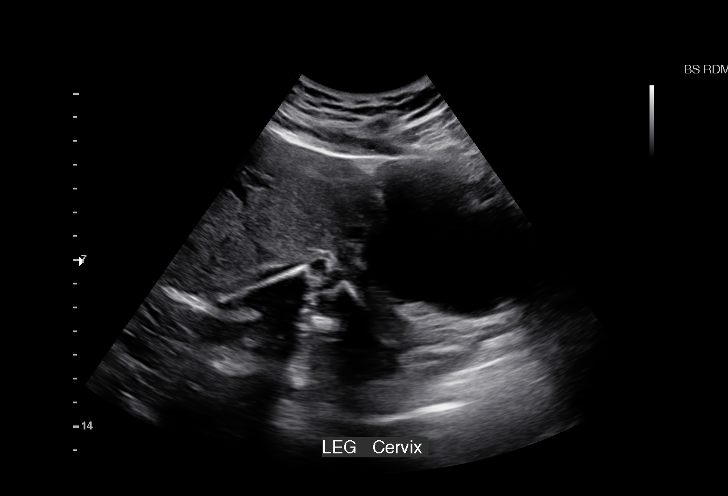
[im 12/44]
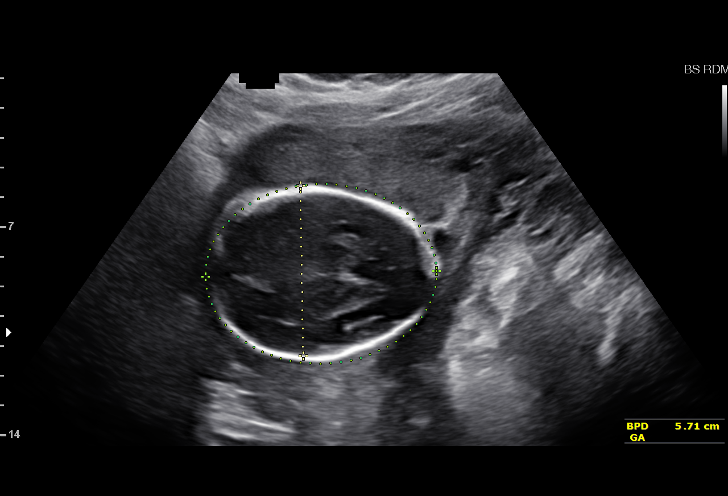
[im 15/44]
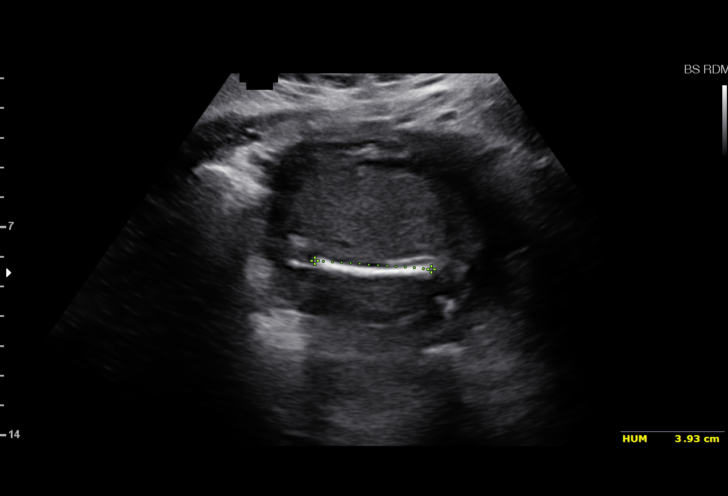
[im 18/44]
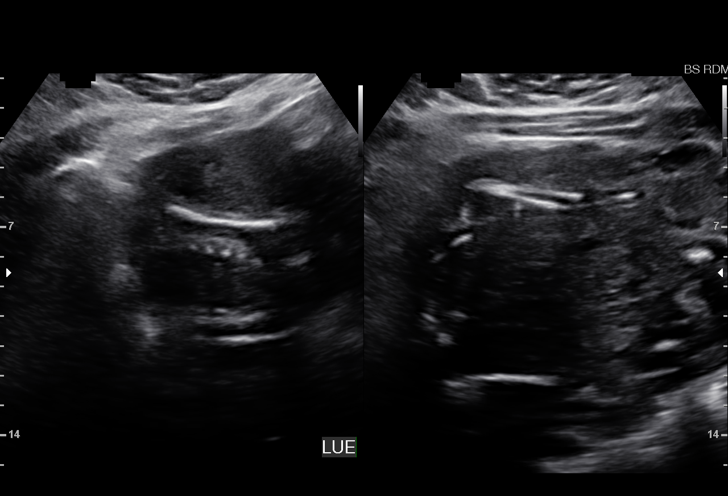
[im 21/44]
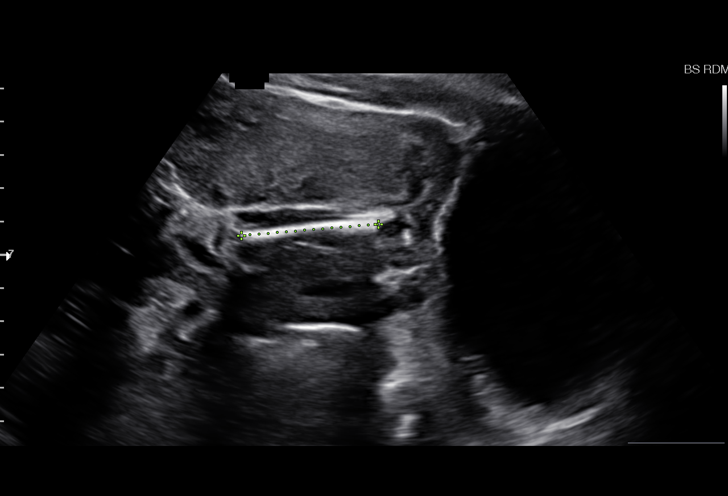
[im 24/44]
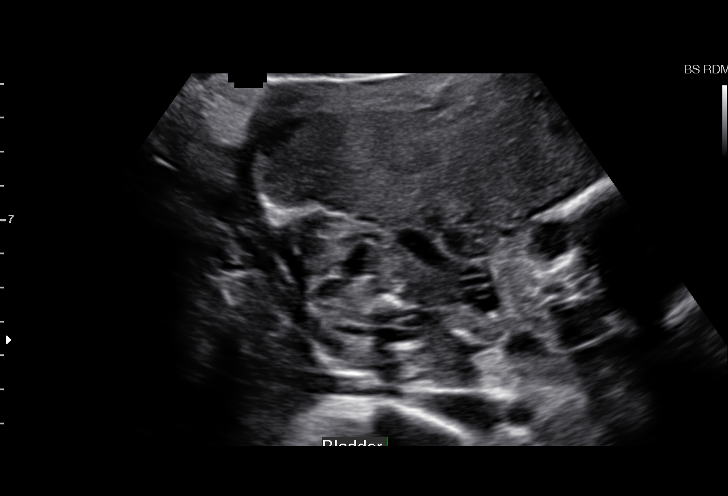
[im 28/44]
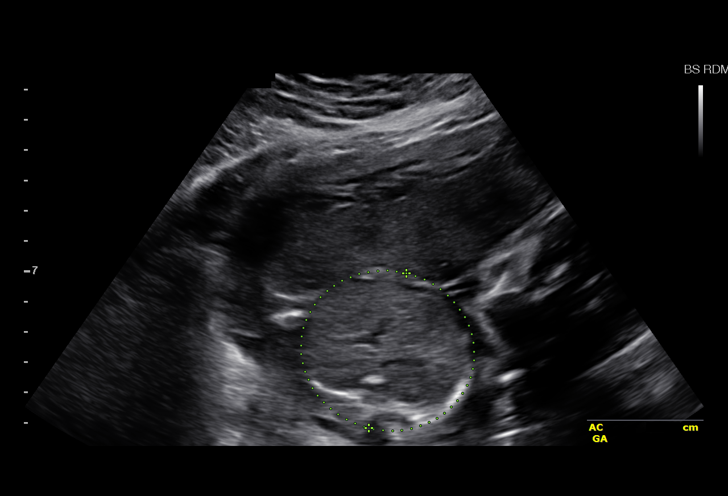
[im 31/44]
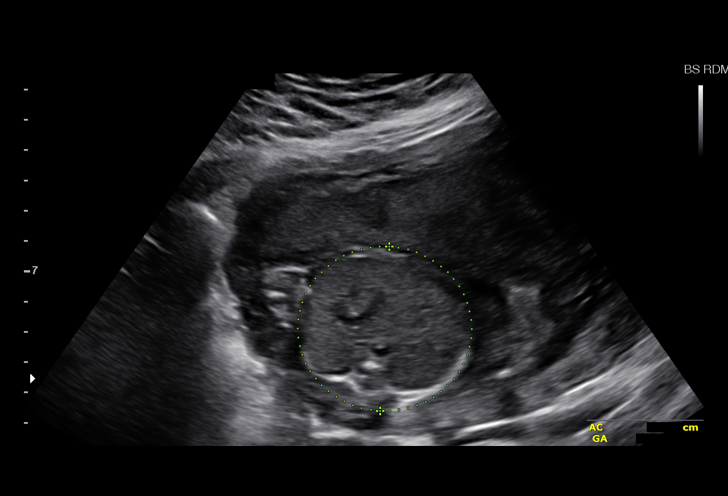
[im 34/44]
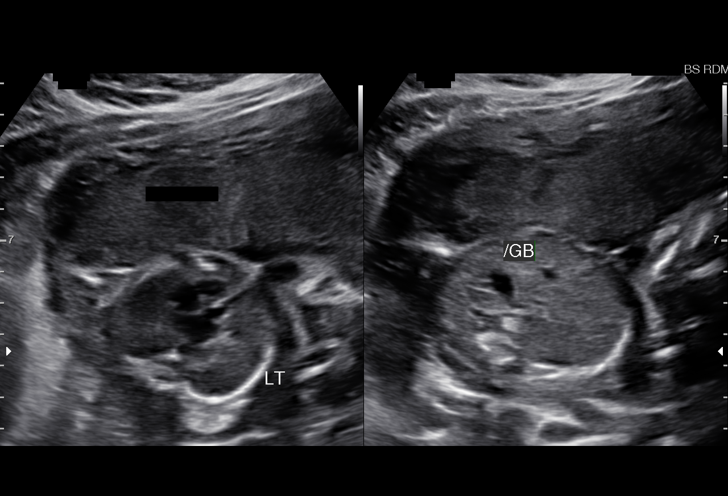
[im 37/44]
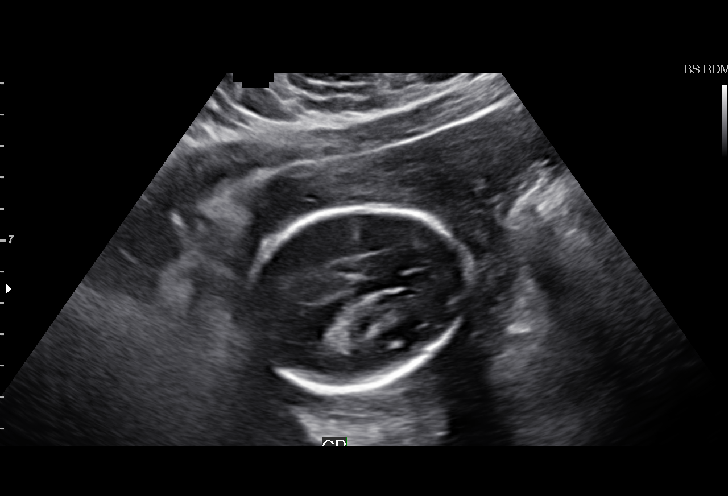
[im 40/44]
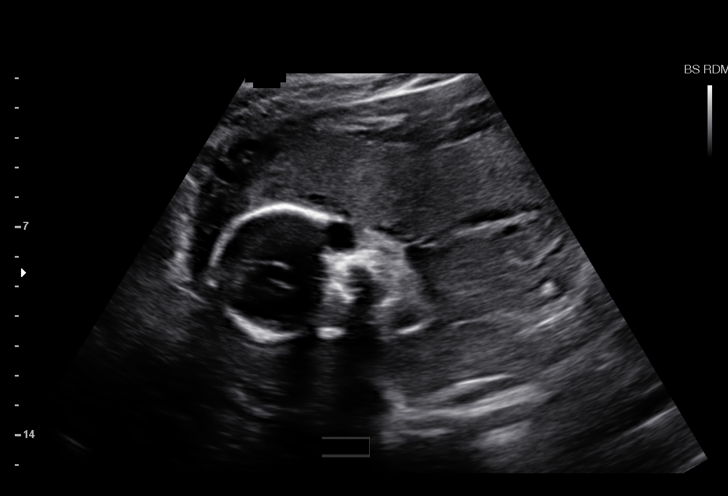
[im 44/44]
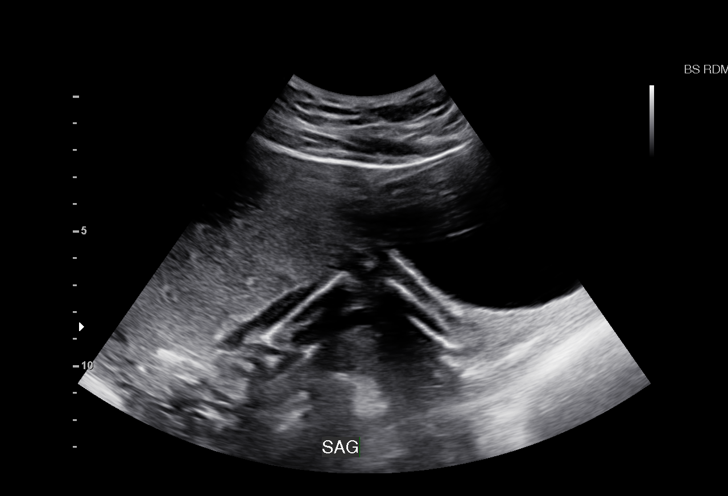

[14 of 28 positions shown; findings below may reference images not displayed]

OBSTETRICS REPORT
(Signed Final 12/11/2014 [DATE])

Service(s) Provided

Indications

Detailed fetal anatomic survey                        Z36
No or Little Prenatal Care
Abdominal pain in pregnancy
23 weeks gestation of pregnancy
Premature rupture of membranes - leaking fluid
Cervical incompetence, second trimester
GBS +
Tobacco use complicating pregnancy, second
trimester
Fetal Evaluation

Num Of Fetuses:    1
Fetal Heart Rate:  158                          bpm
Cardiac Activity:  Observed
Presentation:      Breech, footling
Placenta:          Anterior, above cervical os
P. Cord            Not well visualized
Insertion:

Amniotic Fluid
AFI FV:      Anhydramnios
Biometry

BPD:     57.3  mm     G. Age:  23w 4d                CI:        72.47   70 - 86
FL/HC:      19.1   19.2 -
20.8
HC:     214.1  mm     G. Age:  23w 4d       42  %    HC/AC:      1.22   1.05 -
1.21
AC:     175.8  mm     G. Age:  22w 3d       20  %    FL/BPD:     71.6   71 - 87
FL:        41  mm     G. Age:  23w 2d       39  %    FL/AC:      23.3   20 - 24
HUM:     37.7  mm     G. Age:  23w 2d       42  %

Est. FW:     548  gm      1 lb 3 oz     46  %
Gestational Age

LMP:           18w 3d        Date:  08/04/14                 EDD:   05/11/15
U/S Today:     23w 2d                                        EDD:   04/07/15
Best:          23w 2d     Det. By:  U/S (12/11/14)           EDD:   04/07/15
Anatomy

Cranium:          Not well visualized    Aortic Arch:      Not well visualized
Fetal Cavum:      Not well visualized    Ductal Arch:      Not well visualized
Ventricles:       Appears normal         Diaphragm:        Not well visualized
Choroid Plexus:   Appears normal         Stomach:          Previously Seen
Cerebellum:       Not well visualized    Abdomen:          Not well visualized
Posterior Fossa:  Not well visualized    Abdominal Wall:   Not well visualized
Nuchal Fold:      Not well visualized    Cord Vessels:     Appears normal (3
vessel cord)
Face:             Not well visualized    Kidneys:          Appear normal
Lips:             Not well visualized    Bladder:          Appears normal
Palate:           Not well visualized    Spine:            Not well visualized
Heart:            Situs appears          Lower             Present
normal                 Extremities:
RVOT:             Not well visualized    Upper             Present
Extremities:
LVOT:             Appears normal

Other:  Gender not well visualized. Technically difficult due to low amniotic
fluid and fetal position.
Cervix Uterus Adnexa

Cervix:       Fetal extremity seen protruding through cervix.
Left Ovary:    Previously seen.
Right Ovary:   Previously seen

Adnexa:     No abnormality visualized.
Impression

Singleton intrauterine pregnancy at 23 weeks 2 days
gestation by ultrasound today with fetal cardiac activity
Difficult ultrasound in the setting of anhydramnios
EFW 548 grams
Footling breech and it looks sonographically that the foot is
through the cervix .
Fetal anatomy not well visualized secondary to anhydramnios
Recommendations

I called report to the Faculty practice pager at [phone]. They will
be sending someone for cervical or speculum examination of
the patient to confirm the sonographic finding

## 2016-10-22 ENCOUNTER — Emergency Department (HOSPITAL_COMMUNITY)
Admission: EM | Admit: 2016-10-22 | Discharge: 2016-10-22 | Disposition: A | Discharge status: Home / Self Care | Payer: Self-pay | Attending: Emergency Medicine | Admitting: Emergency Medicine

## 2016-10-22 ENCOUNTER — Encounter (HOSPITAL_COMMUNITY): Payer: Self-pay

## 2016-10-22 ENCOUNTER — Emergency Department (HOSPITAL_COMMUNITY): Payer: Self-pay

## 2016-10-22 DIAGNOSIS — R1032 Left lower quadrant pain: Secondary | ICD-10-CM

## 2016-10-22 DIAGNOSIS — B3731 Acute candidiasis of vulva and vagina: Secondary | ICD-10-CM

## 2016-10-22 DIAGNOSIS — R102 Pelvic and perineal pain: Secondary | ICD-10-CM | POA: Insufficient documentation

## 2016-10-22 DIAGNOSIS — B373 Candidiasis of vulva and vagina: Secondary | ICD-10-CM | POA: Insufficient documentation

## 2016-10-22 DIAGNOSIS — O26891 Other specified pregnancy related conditions, first trimester: Secondary | ICD-10-CM | POA: Insufficient documentation

## 2016-10-22 DIAGNOSIS — O210 Mild hyperemesis gravidarum: Secondary | ICD-10-CM | POA: Insufficient documentation

## 2016-10-22 DIAGNOSIS — Z3A09 9 weeks gestation of pregnancy: Secondary | ICD-10-CM | POA: Insufficient documentation

## 2016-10-22 DIAGNOSIS — Z79899 Other long term (current) drug therapy: Secondary | ICD-10-CM | POA: Insufficient documentation

## 2016-10-22 DIAGNOSIS — D649 Anemia, unspecified: Secondary | ICD-10-CM | POA: Insufficient documentation

## 2016-10-22 LAB — CBC WITH DIFFERENTIAL/PLATELET
BASOS ABS: 0 10*3/uL (ref 0.0–0.1)
Basophils Relative: 0 %
Eosinophils Absolute: 0.1 10*3/uL (ref 0.0–0.7)
Eosinophils Relative: 1 %
HCT: 30.8 % — ABNORMAL LOW (ref 36.0–46.0)
Hemoglobin: 10 g/dL — ABNORMAL LOW (ref 12.0–15.0)
LYMPHS PCT: 32 %
Lymphs Abs: 2.1 10*3/uL (ref 0.7–4.0)
MCH: 28.1 pg (ref 26.0–34.0)
MCHC: 32.5 g/dL (ref 30.0–36.0)
MCV: 86.5 fL (ref 78.0–100.0)
Monocytes Absolute: 0.5 10*3/uL (ref 0.1–1.0)
Monocytes Relative: 7 %
NEUTROS PCT: 60 %
Neutro Abs: 3.9 10*3/uL (ref 1.7–7.7)
Platelets: 233 10*3/uL (ref 150–400)
RBC: 3.56 MIL/uL — AB (ref 3.87–5.11)
RDW: 13.5 % (ref 11.5–15.5)
WBC: 6.6 10*3/uL (ref 4.0–10.5)

## 2016-10-22 LAB — COMPREHENSIVE METABOLIC PANEL
ALT: 12 U/L — AB (ref 14–54)
AST: 15 U/L (ref 15–41)
Albumin: 3.3 g/dL — ABNORMAL LOW (ref 3.5–5.0)
Alkaline Phosphatase: 39 U/L (ref 38–126)
Anion gap: 5 (ref 5–15)
BILIRUBIN TOTAL: 0.1 mg/dL — AB (ref 0.3–1.2)
BUN: 8 mg/dL (ref 6–20)
CHLORIDE: 104 mmol/L (ref 101–111)
CO2: 26 mmol/L (ref 22–32)
CREATININE: 0.59 mg/dL (ref 0.44–1.00)
Calcium: 9 mg/dL (ref 8.9–10.3)
GFR calc Af Amer: >60 mL/min (ref >60)
GLUCOSE: 101 mg/dL — AB (ref 65–99)
Potassium: 4 mmol/L (ref 3.5–5.1)
Sodium: 135 mmol/L (ref 135–145)
TOTAL PROTEIN: 6.7 g/dL (ref 6.5–8.1)

## 2016-10-22 LAB — WET PREP, GENITAL
Clue Cells Wet Prep HPF POC: NONE SEEN
SPERM: NONE SEEN
Trich, Wet Prep: NONE SEEN

## 2016-10-22 LAB — URINALYSIS, ROUTINE W REFLEX MICROSCOPIC
Bilirubin Urine: NEGATIVE
Glucose, UA: NEGATIVE mg/dL
Hgb urine dipstick: NEGATIVE
KETONES UR: NEGATIVE mg/dL
LEUKOCYTES UA: NEGATIVE
Nitrite: NEGATIVE
PH: 6 (ref 5.0–8.0)
Protein, ur: NEGATIVE mg/dL
Specific Gravity, Urine: 1.026 (ref 1.005–1.030)

## 2016-10-22 LAB — I-STAT BETA HCG BLOOD, ED (MC, WL, AP ONLY): I-stat hCG, quantitative: >2000 m[IU]/mL — ABNORMAL HIGH (ref <5)

## 2016-10-22 LAB — HCG, QUANTITATIVE, PREGNANCY: hCG, Beta Chain, Quant, S: 81760 m[IU]/mL — ABNORMAL HIGH (ref <5)

## 2016-10-22 LAB — LIPASE, BLOOD: LIPASE: 21 U/L (ref 11–51)

## 2016-10-22 MED ORDER — ACETAMINOPHEN 325 MG PO TABS
650.0000 mg | ORAL_TABLET | Freq: Once | ORAL | Status: AC
  Administered 2016-10-22: 650 mg via ORAL
  Filled 2016-10-22: qty 2

## 2016-10-22 MED ORDER — CLOTRIMAZOLE 1 % VA CREA: 1.0000 | TOPICAL_CREAM | Freq: Every day | VAGINAL | 0 refills | Status: DC

## 2016-10-22 MED ORDER — CLOTRIMAZOLE 1 % VA CREA: 1.0000 | TOPICAL_CREAM | Freq: Every day | VAGINAL | 0 refills | Status: AC

## 2016-10-22 NOTE — ED Notes (Signed)
Patient transported to Ultrasound 

## 2016-10-22 NOTE — ED Notes (Signed)
Pt returned from US

## 2016-10-22 NOTE — Discharge Instructions (Signed)
Your vaginal swab today revealed yeast infection, so use the vaginal cream as directed until you've completed the 7 day course in order to treat this infection. The remainder of today's work up was reassuring; lower pelvic pain during early pregnancy can be fairly common. Use tylenol as needed for pain. Use heating pad or warm water soaks to your belly to help with pain. DO NOT TAKE NSAIDs as these are not safe in pregnancy. Morning sickness is common in early pregnancy, follow the list of foods below to help with this symptom. Stay well hydrated and get plenty of rest. You may want to avoid sexual activity for the next few days to avoid aggravating your symptoms. Continue taking your prenatal vitamins. Follow up with your OBGYN in 3-5 days for recheck of symptoms and ongoing management of your pregnancy. [facility] for changes or worsening symptoms.   You were tested for HIV and Syphilis, and the hospital will call you if the lab is positive. DO NOT ENGAGE IN SEXUAL ACTIVITY UNTIL YOU FIND OUT ABOUT YOUR RESULTS AND HAVE PARTNERS TESTED AND TREATED. ALL PARTNERS MUST BE TESTED AND TREATED FOR STD'S. ALWAYS USE CONDOMS WHEN ENGAGING IN INTERCOURSE. [facility] for future STD concerns or screenings/treatments/etc.

## 2016-10-22 NOTE — ED Triage Notes (Signed)
[redacted] weeks pregnant, G2P0, fetal demise @ 26 weeks. Last OV: 2 weeks ago, no cervical exam done, advised to f/u [redacted] weeks pregnant.   No bleeding, leaking fluid, vaginal discharge.  Onset last night lower mid abd cramping.  Vomited x 1 this morning.

## 2016-10-22 NOTE — ED Provider Notes (Signed)
Patient Transfer[facility] at end of shift [facility], PA-C pending ultrasound results to confirm intrauterine pregnancy and urine analysis. Plan was to discharge home with already discussed plan if urine is negative and ultrasound confirms intrauterine pregnancy.  Reviewed the ultrasound results which show a viable intrauterine pregnancy at 13 weeks 4 days. Urine analysis is negative. Discussed results with patient and patient was stable and well-appearing prior to discharge.  Discharge home with previously discussed plan and treatment.    Georgiana ShoreMitchell, Aina Rossbach B, PA-C 10/22/16 [id number]    Margarita Grizzleay, Danielle, MD 10/22/16 2330

## 2016-10-22 NOTE — ED Provider Notes (Signed)
[facility]-EMERGENCY DEPT Provider Note   CSN: [id number] Arrival date & time: 10/22/16  1633     History   Chief Complaint Chief Complaint  Patient presents with  . Abdominal Pain    HPI Melinda Richardson is a 21 y.o. 992P0100 female currently pregnant 3773w0d EGA by LMP, with a PMHx of PPROM due to incompetent cervix and subsequent PTL at 2162w2d with footling breech infant requiring emergent C-section 12/2014, who presents to the ED with complaints of gradual onset suprapubic and LLQ abdominal pain that began around midnight last night. She states that she bends a lot at work and wonders whether this could be the cause of her pain. She describes the pain as 7/10 intermittent crampy nonradiating LLQ and suprapubic abdominal pain, which worsens with bending over, and with no treatments tried prior to arrival. She has had some morning sickness which is normal for her considering she is in her first trimester, had one episode of nonbloody nonbilious emesis early this morning but has not had any ongoing emesis or nausea since then. Of note, chart review reveals she was seen by her OBGYN ([facility]) on 10/10/16 for c/o fishy vaginal discharge, no provider/exam notes are in the system at this time but results review reveals wet prep was done and showed +BV, neg trichomonas/yeast, had GC/CT testing which was negative; treated per protocol with flagyl 500mg  BID (starting 10/13/16). She completed the meds last night. She denies any ongoing vaginal discharge. LMP 08/20/16. Sexually active with 1 female partner, unprotected.   She denies fevers, chills, CP, SOB, ongoing nausea/vomiting, diarrhea/constipation, obstipation, melena, hematochezia, hematemesis, hematuria, dysuria, vaginal bleeding/discharge, vaginal itching, genital sores, myalgias, arthralgias, numbness, tingling, focal weakness, or any other complaints at this time. Denies recent travel, sick contacts, suspicious food intake, EtOH use, or NSAID use.   OF  NOTE: her OB history in the system currently states she's G3P0101 however pt denies that she has a living child; states this is her second pregnancy, the first one was the 9562w2d preterm infant that died after birth; she has not had another pregnancy between that one and this one. She has NO living children.    The history is provided by the patient and medical records. No language interpreter was used.  Abdominal Pain   This is a new problem. The current episode started 12 to 24 hours ago. Episode frequency: intermittent. The problem has not changed since onset.The pain is associated with an unknown factor. The pain is located in the LLQ and suprapubic region. The quality of the pain is cramping. The pain is at a severity of 7/10. The pain is moderate. Associated symptoms include nausea (morning sickness; none ongoing) and vomiting (once this morning, none ongoing). Pertinent negatives include fever, diarrhea, flatus, hematochezia, melena, constipation, dysuria, hematuria, arthralgias and myalgias. The symptoms are aggravated by activity. Nothing relieves the symptoms.    Past Medical History:  Diagnosis Date  . Medical history non-contributory     Patient Active Problem List   Diagnosis Date Noted  . S/P emergency C-section 12/17/2014  . Preterm premature rupture of membranes (PPROM) with unknown onset of labor 12/11/2014  . Cervical incompetence during pregnancy in second trimester 12/09/2014  . No prenatal care in current pregnancy in second trimester 12/09/2014  . Trichomonal vaginitis during pregnancy in second trimester 12/09/2014    Past Surgical History:  Procedure Laterality Date  . CESAREAN SECTION N/A 12/17/2014   Procedure: CESAREAN SECTION;  Surgeon: Tilda BurrowJohn Ferguson V, MD;  [facility];  Service: Obstetrics;  Laterality: N/A;  . CESAREAN SECTION  12/17/14   PTL @ 24 wks - footling breech  . CESAREAN SECTION CLASSICAL  12/17/2014   at [redacted]w[redacted]d  . TONSILLECTOMY      OB History      Gravida Para Term Preterm AB Living   3 1 0 1 0 1   SAB TAB Ectopic Multiple Live Births   0 0 0   1       Home Medications    Prior to Admission medications   Medication Sig Start Date End Date Taking? Authorizing Provider  albuterol (PROVENTIL HFA;VENTOLIN HFA) 108 (90 Base) MCG/ACT inhaler Inhale 1-2 puffs into the lungs every 6 (six) hours as needed for wheezing or shortness of breath. Patient not taking: Reported on 10/03/2016 06/18/16   Demetrios Loll T, PA-C  benzonatate (TESSALON) 100 MG capsule Take 1 capsule (100 mg total) by mouth every 8 (eight) hours. Patient not taking: Reported on 10/03/2016 06/13/16   Deatra Canter, FNP  fluticasone [facility]) 50 MCG/ACT nasal spray Place 2 sprays into both nostrils daily. Patient not taking: Reported on 10/03/2016 06/18/16   Demetrios Loll T, PA-C  guaiFENesin-codeine (ROBITUSSIN AC) 100-10 MG/5ML syrup Take 5 mLs by mouth 3 (three) times daily as needed for cough. Patient not taking: Reported on 10/03/2016 06/18/16   Demetrios Loll T, PA-C  ipratropium (ATROVENT) 0.06 % nasal spray Place 2 sprays into both nostrils 4 (four) times daily. Patient not taking: Reported on 10/03/2016 06/13/16   Deatra Canter, FNP  metroNIDAZOLE (FLAGYL) 500 MG tablet Take 1 tablet (500 mg total) by mouth 2 (two) times daily. 10/13/16   [name], DO    Family History Family History  Problem Relation Age of Onset  . Kidney failure Father   . Diabetes Mother     Social History Social History  Substance Use Topics  . Smoking status: Never Smoker  . Smokeless tobacco: Never Used  . Alcohol use No     Allergies   Patient has no known allergies.   Review of Systems Review of Systems  Constitutional: Negative for chills and fever.  Respiratory: Negative for shortness of breath.   Cardiovascular: Negative for chest pain.  Gastrointestinal: Positive for abdominal pain, nausea (morning sickness; none ongoing) and vomiting (once this  morning, none ongoing). Negative for blood in stool, constipation, diarrhea, flatus, hematochezia and melena.  Genitourinary: Negative for dysuria, genital sores, hematuria, vaginal bleeding, vaginal discharge and vaginal pain.  Musculoskeletal: Negative for arthralgias and myalgias.  Skin: Negative for color change.  Allergic/Immunologic: Negative for immunocompromised state.  Neurological: Negative for weakness and numbness.  Psychiatric/Behavioral: Negative for confusion.   All other systems reviewed and are negative for acute change except as noted in the HPI.    Physical Exam Updated Vital Signs BP 134/70   Pulse 94   Temp 98.4 F (36.9 C) (Oral)   Resp 16   Ht 5\' 4"  (1.626 m)   Wt 111.1 kg (245 lb)   LMP 08/20/2016   SpO2 99%   BMI 42.05 kg/m   Physical Exam  Constitutional: She is oriented to person, place, and time. Vital signs are normal. She appears well-developed and well-nourished.  Non-toxic appearance. No distress.  Afebrile, nontoxic, NAD, obese  HENT:  Head: Normocephalic and atraumatic.  Mouth/Throat: Oropharynx is clear and moist and mucous membranes are normal.  Eyes: Conjunctivae and EOM are normal. Right eye exhibits no discharge. Left eye exhibits no discharge.  Neck: Normal range of motion. Neck supple.  Cardiovascular: Normal rate, regular rhythm, normal heart sounds and intact distal pulses.  Exam reveals no gallop and no friction rub.   No murmur heard. Pulmonary/Chest: Effort normal and breath sounds normal. No respiratory distress. She has no decreased breath sounds. She has no wheezes. She has no rhonchi. She has no rales.  Abdominal: Soft. Normal appearance and bowel sounds are normal. She exhibits no distension. There is tenderness in the left lower quadrant. There is no rigidity, no rebound, no guarding, no CVA tenderness, no tenderness at McBurney's point and negative Murphy's sign.  Soft, obese but nondistended, +BS throughout, with mild LLQ TTP,  no r/g/r, neg murphy's, neg mcburney's, no CVA TTP   Genitourinary: Uterus normal. Pelvic exam was performed with patient supine. There is no rash, tenderness or lesion on the right labia. There is no rash, tenderness or lesion on the left labia. Cervix exhibits discharge (scant clear physiologic). Cervix exhibits no motion tenderness and no friability. Right adnexum displays no mass, no tenderness and no fullness. Left adnexum displays tenderness. Left adnexum displays no mass and no fullness. No erythema, tenderness or bleeding in the vagina. Vaginal discharge (scant clear physiologic) found.  Genitourinary Comments: Chaperone present for exam. No rashes, lesions, or tenderness to external genitalia. No erythema, injury, or tenderness to vaginal mucosa. Scant clear physiologic vaginal discharge at cervical os and in vaginal vault, no bleeding within vaginal vault or from cervical os. No adnexal masses or fullness, but mild L adnexal TTP. No CMT or cervical friability. Cervical os is closed. Uterus non-deviated, mobile, nonTTP, and without definite enlargement however body habitus limits exam slightly and it's difficult to tell if gravid uterus is palpable or not.   Musculoskeletal: Normal range of motion.  Neurological: She is alert and oriented to person, place, and time. She has normal strength. No sensory deficit.  Skin: Skin is warm, dry and intact. No rash noted.  Psychiatric: She has a normal mood and affect.  Nursing note and vitals reviewed.    ED Treatments / Results  Labs (all labs ordered are listed, but only abnormal results are displayed) Labs Reviewed  WET PREP, GENITAL - Abnormal; Notable for the following:       Result Value   Yeast Wet Prep HPF POC PRESENT (*)    WBC, Wet Prep HPF POC FEW (*)    All other components within normal limits  CBC WITH DIFFERENTIAL/PLATELET - Abnormal; Notable for the following:    RBC 3.56 (*)    Hemoglobin 10.0 (*)    HCT 30.8 (*)    All other  components within normal limits  COMPREHENSIVE METABOLIC PANEL - Abnormal; Notable for the following:    Glucose, Bld 101 (*)    Albumin 3.3 (*)    ALT 12 (*)    Total Bilirubin 0.1 (*)    All other components within normal limits  HCG, QUANTITATIVE, PREGNANCY - Abnormal; Notable for the following:    hCG, Beta Chain, Quant, S 81,760 (*)    All other components within normal limits  I-STAT BETA HCG BLOOD, ED ([facility], [facility], [facility] ONLY) - Abnormal; Notable for the following:    I-stat hCG, quantitative >2,000.0 (*)    All other components within normal limits  LIPASE, BLOOD  RPR  HIV ANTIBODY (ROUTINE TESTING)  URINALYSIS, ROUTINE W REFLEX MICROSCOPIC     10/10/2016 00:00  Chlamydia Negative  Neisseria gonorrhea Negative    10/10/2016 00:00  Bacterial vaginitis **  POSITIVE (A)  Trichomonas Negative  Candida vaginitis Negative   EKG  EKG Interpretation None       Radiology No results found.  Procedures Procedures (including critical care time)  Medications Ordered in ED Medications  acetaminophen (TYLENOL) tablet 650 mg (650 mg Oral Given 10/22/16 1753)     Initial Impression / Assessment and Plan / ED Course  I have reviewed the triage vital signs and the nursing notes.  Pertinent labs & imaging results that were available during my care of the patient were reviewed by me and considered in my medical decision making (see chart for details).     21 y.o. G23P0100 female at [redacted]w[redacted]d EGA by LMP, here with suprapubic/LLQ pain x17hrs, had morning sickness with 1 episode of emesis this morning, but no ongoing nausea or repeat vomiting. On exam, mild LLQ TTP, pelvic reveals scant clear physiologic discharge with closed os and no bleeding, mild L adnexal TTP, no masses appreciated, neg CMT. Will get labs but hold off on GC/CT testing since she just had this done 10/10/16; will also get U/S to eval for ectopic vs TOA vs torsion vs cyst vs other etiology of symptoms, and to see if she has an IUP.  Pt declines wanting anything for nausea since this isn't ongoing; tylenol for pain. Will reassess shortly.   7:31 PM CBC w/diff with mild stable anemia at baseline, otherwise unremarkable. CMP unremarkable. Lipase WNL. Wet prep with +yeast but no trich or clue cells and only few WBCs; will treat with topical clotrimazole. Quant BetaHCG 81,760. U/A not yet collected, but will collect now. U/S not yet done. Pt feeling well with no current complaints or requests; will reassess after U/A and U/S done.   7:55 PM U/A and U/S pending. Patient care to be resumed by Mathews Robinsons, PA-C at shift change sign-out. Patient history has been discussed with midlevel resuming care. Assuming results are unremarkable and show IUP without any other findings, then plan was discussed with patient as follows: Discussed that pelvic pain and morning sickness in early pregnancy can be common; advised tylenol/heat use for pain, no NSAIDs, continue prenatal vitamins, stay hydrated, diet plan for morning sickness given, pelvic rest advised, and discussed f/up with her OBGYN in 3-5 days for recheck of symptoms and ongoing management of her pregnancy. Rx for clotrimazole cream given. Strict return precautions advised. So as long as pending results are unremarkable, that would be the plan; if any abnormal/unexpected results come back then midlevel assuming care would update her and change plan accordingly. Please see their notes for further documentation of pending results and dispo/plan/ongoing care. Pt stable at sign-out and updated on transfer of care.     Final Clinical Impressions(s) / ED Diagnoses   Final diagnoses:  Left adnexal tenderness  Pelvic pain in pregnancy, antepartum, first trimester  LLQ pain  Morning sickness  Chronic anemia  Yeast vaginitis    New Prescriptions Current Discharge Medication List    START taking these medications   Details  clotrimazole (GYNE-LOTRIMIN) 1 % vaginal cream Place 1  Applicatorful vaginally at bedtime. x7 days Qty: 45 g, Refills: [address] [place], [place], [place] 10/22/16 1956    Margarita Grizzle, MD 10/22/16 2330

## 2016-10-24 LAB — HIV ANTIBODY (ROUTINE TESTING W REFLEX): HIV SCREEN 4TH GENERATION: NONREACTIVE

## 2016-10-24 LAB — RPR: RPR Ser Ql: NONREACTIVE

## 2016-11-30 ENCOUNTER — Encounter: Payer: Self-pay | Admitting: Medical

## 2017-01-01 LAB — OB RESULTS CONSOLE GC/CHLAMYDIA
Chlamydia: NEGATIVE
GC PROBE AMP, GENITAL: NEGATIVE

## 2017-01-01 LAB — OB RESULTS CONSOLE ABO/RH: RH Type: POSITIVE

## 2017-01-01 LAB — OB RESULTS CONSOLE RPR: RPR: NONREACTIVE

## 2017-01-01 LAB — URINE CULTURE

## 2017-01-01 LAB — OB RESULTS CONSOLE VARICELLA ZOSTER ANTIBODY, IGG: Varicella: NON-IMMUNE/NOT IMMUNE

## 2017-01-01 LAB — CYSTIC FIBROSIS DIAGNOSTIC STUDY: INTERPRETATION-CFDNA: NEGATIVE

## 2017-01-01 LAB — OB RESULTS CONSOLE ANTIBODY SCREEN: Antibody Screen: NEGATIVE

## 2017-01-09 ENCOUNTER — Encounter: Payer: Self-pay | Admitting: *Deleted

## 2017-01-10 ENCOUNTER — Ambulatory Visit (INDEPENDENT_AMBULATORY_CARE_PROVIDER_SITE_OTHER): Payer: Medicaid Other | Admitting: Obstetrics and Gynecology

## 2017-01-10 ENCOUNTER — Encounter: Payer: Self-pay | Admitting: Obstetrics and Gynecology

## 2017-01-10 VITALS — BP 122/63 | HR 94 | Wt 268.3 lb

## 2017-01-10 DIAGNOSIS — O3432 Maternal care for cervical incompetence, second trimester: Secondary | ICD-10-CM

## 2017-01-10 DIAGNOSIS — R87610 Atypical squamous cells of undetermined significance on cytologic smear of cervix (ASC-US): Secondary | ICD-10-CM | POA: Insufficient documentation

## 2017-01-10 DIAGNOSIS — O09899 Supervision of other high risk pregnancies, unspecified trimester: Secondary | ICD-10-CM | POA: Insufficient documentation

## 2017-01-10 DIAGNOSIS — Z2839 Other underimmunization status: Secondary | ICD-10-CM | POA: Insufficient documentation

## 2017-01-10 DIAGNOSIS — R87810 Cervical high risk human papillomavirus (HPV) DNA test positive: Secondary | ICD-10-CM

## 2017-01-10 DIAGNOSIS — O0990 Supervision of high risk pregnancy, unspecified, unspecified trimester: Secondary | ICD-10-CM | POA: Insufficient documentation

## 2017-01-10 DIAGNOSIS — O09212 Supervision of pregnancy with history of pre-term labor, second trimester: Secondary | ICD-10-CM

## 2017-01-10 DIAGNOSIS — O0992 Supervision of high risk pregnancy, unspecified, second trimester: Secondary | ICD-10-CM

## 2017-01-10 DIAGNOSIS — Z283 Underimmunization status: Secondary | ICD-10-CM

## 2017-01-10 DIAGNOSIS — O09219 Supervision of pregnancy with history of pre-term labor, unspecified trimester: Secondary | ICD-10-CM

## 2017-01-10 LAB — POCT URINALYSIS DIP (DEVICE)
BILIRUBIN URINE: NEGATIVE
GLUCOSE, UA: NEGATIVE mg/dL
Hgb urine dipstick: NEGATIVE
Nitrite: NEGATIVE
Protein, ur: 30 mg/dL — AB
Urobilinogen, UA: 0.2 mg/dL (ref 0.0–1.0)
pH: 6 (ref 5.0–8.0)

## 2017-01-10 MED ORDER — PROGESTERONE MICRONIZED 200 MG PO CAPS: ORAL_CAPSULE | ORAL | 3 refills | Status: DC

## 2017-01-10 NOTE — Progress Notes (Signed)
Called Health department re: anatomy scan done 01/08/17 . They will send when results available.  Addendum 5:24  Medicaid home form completed.

## 2017-01-10 NOTE — Patient Instructions (Signed)
Preterm Labor and Birth Information  The normal length of a pregnancy is 39-41 weeks. Preterm labor is when labor starts before 37 completed weeks of pregnancy.  What are the risk factors for preterm labor?  Preterm labor is more likely to occur in women who:   Have certain infections during pregnancy such as a bladder infection, sexually transmitted infection, or infection inside the uterus (chorioamnionitis).   Have a shorter-than-normal cervix.   Have gone into preterm labor before.   Have had surgery on their cervix.   Are younger than age 17 or older than age 35.   Are African American.   Are pregnant with twins or multiple babies (multiple gestation).   Take street drugs or smoke while pregnant.   Do not gain enough weight while pregnant.   Became pregnant shortly after having been pregnant.    What are the symptoms of preterm labor?  Symptoms of preterm labor include:   Cramps similar to those that can happen during a menstrual period. The cramps may happen with diarrhea.   Pain in the abdomen or lower back.   Regular uterine contractions that may feel like tightening of the abdomen.   A feeling of increased pressure in the pelvis.   Increased watery or bloody mucus discharge from the vagina.   Water breaking (ruptured amniotic sac).    Why is it important to recognize signs of preterm labor?  It is important to recognize signs of preterm labor because babies who are born prematurely may not be fully developed. This can put them at an increased risk for:   Long-term (chronic) heart and lung problems.   Difficulty immediately after birth with regulating body systems, including blood sugar, body temperature, heart rate, and breathing rate.   Bleeding in the brain.   Cerebral palsy.   Learning difficulties.   Death.    These risks are highest for babies who are born before 34 weeks of pregnancy.  How is preterm labor treated?  Treatment depends on the length of your pregnancy, your  condition, and the health of your baby. It may involve:   Having a stitch (suture) placed in your cervix to prevent your cervix from opening too early (cerclage).   Taking or being given medicines, such as:  ? Hormone medicines. These may be given early in pregnancy to help support the pregnancy.  ? Medicine to stop contractions.  ? Medicines to help mature the baby's lungs. These may be prescribed if the risk of delivery is high.  ? Medicines to prevent your baby from developing cerebral palsy.    If the labor happens before 34 weeks of pregnancy, you may need to stay in the hospital.  What should I do if I think I am in preterm labor?  If you think that you are going into preterm labor, call your health care provider right away.  How can I prevent preterm labor in future pregnancies?  To increase your chance of having a full-term pregnancy:   Do not use any tobacco products, such as cigarettes, chewing tobacco, and e-cigarettes. If you need help quitting, ask your health care provider.   Do not use street drugs or medicines that have not been prescribed to you during your pregnancy.   Talk with your health care provider before taking any herbal supplements, even if you have been taking them regularly.   Make sure you gain a healthy amount of weight during your pregnancy.   Watch for infection. If you   you have with your health care provider. Document Released: 06/17/2003 Document Revised: 09/07/2015 Document Reviewed: 08/18/2015 Elsevier Interactive Patient Education  2018 [employer]. Second Trimester of Pregnancy The second trimester is from week 14 through week 27 (months 4 through 6). The second trimester is  often a time when you feel your best. Your body has adjusted to being pregnant, and you begin to feel better physically. Usually, morning sickness has lessened or quit completely, you may have more energy, and you may have an increase in appetite. The second trimester is also a time when the fetus is growing rapidly. At the end of the sixth month, the fetus is about 9 inches long and weighs about 1 pounds. You will likely begin to feel the baby move (quickening) between 16 and 20 weeks of pregnancy. Body changes during your second trimester Your body continues to go through many changes during your second trimester. The changes vary from woman to woman.  Your weight will continue to increase. You will notice your lower abdomen bulging out.  You may begin to get stretch marks on your hips, abdomen, and breasts.  You may develop headaches that can be relieved by medicines. The medicines should be approved by your health care provider.  You may urinate more often because the fetus is pressing on your bladder.  You may develop or continue to have heartburn as a result of your pregnancy.  You may develop constipation because certain hormones are causing the muscles that push waste through your intestines to slow down.  You may develop hemorrhoids or swollen, bulging veins (varicose veins).  You may have back pain. This is caused by: ? Weight gain. ? Pregnancy hormones that are relaxing the joints in your pelvis. ? A shift in weight and the muscles that support your balance.  Your breasts will continue to grow and they will continue to become tender.  Your gums may bleed and may be sensitive to brushing and flossing.  Dark spots or blotches (chloasma, mask of pregnancy) may develop on your face. This will likely fade after the baby is born.  A dark line from your belly button to the pubic area (linea nigra) may appear. This will likely fade after the baby is born.  You may have changes in  your hair. These can include thickening of your hair, rapid growth, and changes in texture. Some women also have hair loss during or after pregnancy, or hair that feels dry or thin. Your hair will most likely return to normal after your baby is born.  What to expect at prenatal visits During a routine prenatal visit:  You will be weighed to make sure you and the fetus are growing normally.  Your blood pressure will be taken.  Your abdomen will be measured to track your baby's growth.  The fetal heartbeat will be listened to.  Any test results from the previous visit will be discussed.  Your health care provider may ask you:  How you are feeling.  If you are feeling the baby move.  If you have had any abnormal symptoms, such as leaking fluid, bleeding, severe headaches, or abdominal cramping.  If you are using any tobacco products, including cigarettes, chewing tobacco, and electronic cigarettes.  If you have any questions.  Other tests that may be performed during your second trimester include:  Blood tests that check for: ? Low iron levels (anemia). ? High blood sugar that affects pregnant women (gestational diabetes) between 52 and 28 weeks. ?  Rh antibodies. This is to check for a protein on red blood cells (Rh factor).  Urine tests to check for infections, diabetes, or protein in the urine.  An ultrasound to confirm the proper growth and development of the baby.  An amniocentesis to check for possible genetic problems.  Fetal screens for spina bifida and Down syndrome.  HIV (human immunodeficiency virus) testing. Routine prenatal testing includes screening for HIV, unless you choose not to have this test.  Follow these instructions at home: Medicines  Follow your health care provider's instructions regarding medicine use. Specific medicines may be either safe or unsafe to take during pregnancy.  Take a prenatal vitamin that contains at least 600 micrograms (mcg) of  folic acid.  If you develop constipation, try taking a stool softener if your health care provider approves. Eating and drinking  Eat a balanced diet that includes fresh fruits and vegetables, whole grains, good sources of protein such as meat, eggs, or tofu, and low-fat dairy. Your health care provider will help you determine the amount of weight gain that is right for you.  Avoid raw meat and uncooked cheese. These carry germs that can cause birth defects in the baby.  If you have low calcium intake from food, talk to your health care provider about whether you should take a daily calcium supplement.  Limit foods that are high in fat and processed sugars, such as fried and sweet foods.  To prevent constipation: ? Drink enough fluid to keep your urine clear or pale yellow. ? Eat foods that are high in fiber, such as fresh fruits and vegetables, whole grains, and beans. Activity  Exercise only as directed by your health care provider. Most women can continue their usual exercise routine during pregnancy. Try to exercise for 30 minutes at least 5 days a week. Stop exercising if you experience uterine contractions.  Avoid heavy lifting, wear low heel shoes, and practice good posture.  A sexual relationship may be continued unless your health care provider directs you otherwise. Relieving pain and discomfort  Wear a good support bra to prevent discomfort from breast tenderness.  Take warm sitz baths to soothe any pain or discomfort caused by hemorrhoids. Use hemorrhoid cream if your health care provider approves.  Rest with your legs elevated if you have leg cramps or low back pain.  If you develop varicose veins, wear support hose. Elevate your feet for 15 minutes, 3-4 times a day. Limit salt in your diet. Prenatal Care  Write down your questions. Take them to your prenatal visits.  Keep all your prenatal visits as told by your health care provider. This is  important. Safety  Wear your seat belt at all times when driving.  Make a list of emergency phone numbers, including numbers for family, friends, the hospital, and police and fire departments. General instructions  Ask your health care provider for a referral to a local prenatal education class. Begin classes no later than the beginning of month 6 of your pregnancy.  Ask for help if you have counseling or nutritional needs during pregnancy. Your health care provider can offer advice or refer you to specialists for help with various needs.  Do not use hot tubs, steam rooms, or saunas.  Do not douche or use tampons or scented sanitary pads.  Do not cross your legs for long periods of time.  Avoid cat litter boxes and soil used by cats. These carry germs that can cause birth defects in the baby  and possibly loss of the fetus by miscarriage or stillbirth.  Avoid all smoking, herbs, alcohol, and unprescribed drugs. Chemicals in these products can affect the formation and growth of the baby.  Do not use any products that contain nicotine or tobacco, such as cigarettes and e-cigarettes. If you need help quitting, ask your health care provider.  Visit your dentist if you have not gone yet during your pregnancy. Use a soft toothbrush to brush your teeth and be gentle when you floss. Contact a health care provider if:  You have dizziness.  You have mild pelvic cramps, pelvic pressure, or nagging pain in the abdominal area.  You have persistent nausea, vomiting, or diarrhea.  You have a bad smelling vaginal discharge.  You have pain when you urinate. Get help right away if:  You have a fever.  You are leaking fluid from your vagina.  You have spotting or bleeding from your vagina.  You have severe abdominal cramping or pain.  You have rapid weight gain or weight loss.  You have shortness of breath with chest pain.  You notice sudden or extreme swelling of your face, hands,  ankles, feet, or legs.  You have not felt your baby move in over an hour.  You have severe headaches that do not go away when you take medicine.  You have vision changes. Summary  The second trimester is from week 14 through week 27 (months 4 through 6). It is also a time when the fetus is growing rapidly.  Your body goes through many changes during pregnancy. The changes vary from woman to woman.  Avoid all smoking, herbs, alcohol, and unprescribed drugs. These chemicals affect the formation and growth your baby.  Do not use any tobacco products, such as cigarettes, chewing tobacco, and e-cigarettes. If you need help quitting, ask your health care provider.  Contact your health care provider if you have any questions. Keep all prenatal visits as told by your health care provider. This is important. This information is not intended to replace advice given to you by your health care provider. Make sure you discuss any questions you have with your health care provider. Document Released: 03/21/2001 Document Revised: 09/02/2015 Document Reviewed: 05/28/2012 Elsevier Interactive Patient Education  2017 [employer].

## 2017-01-10 NOTE — Progress Notes (Signed)
  Subjective:    Melinda Richardson is being seen today for her first obstetrical visit at this office. She has been followed at the health department and was referred here for progesterone therapy. Reviewed history, patient with advanced cervical dilation at 23 weeks with subsequent classical cesarean and neonatal demise at >24 weeks. She has not been followed for vaginal lengths or offered management for cervical incompetence. She is at [redacted]w[redacted]d gestation by [place] at health department.  Pregnancy history fully reviewed. She understands that she will need a c-section with this pregnancy.   Patient reports no complaints and is feeling well.   Review of Systems:   Review of Systems  All other systems reviewed and are negative.   Objective:     BP 122/63   Pulse 94   Wt 268 lb 4.8 oz (121.7 kg)   LMP 07/19/2016 (Approximate)   BMI 46.05 kg/m  Physical Exam  Constitutional: She is oriented to person, place, and time. She appears well-developed and well-nourished.  HENT:  Head: Normocephalic and atraumatic.  Right Ear: External ear normal.  Left Ear: External ear normal.  Nose: Nose normal.  Mouth/Throat: Oropharynx is clear and moist.  Eyes: Pupils are equal, round, and reactive to light. Conjunctivae and EOM are normal.  Neck: Normal range of motion. Neck supple.  Cardiovascular: Normal rate, regular rhythm, normal heart sounds and intact distal pulses.   Respiratory: Effort normal and breath sounds normal.  GI: Soft.  Genitourinary: Vagina normal.  Genitourinary Comments: Cervix appears closed and approx 1 cm thick, no blood noted, scant white discharge noted in vault  Musculoskeletal: Normal range of motion.  Neurological: She is alert and oriented to person, place, and time.  Skin: Skin is warm and dry.  Psychiatric: She has a normal mood and affect. Her behavior is normal. Judgment and thought content normal.    Exam  Assessment:    Pregnancy: G2P0100 Patient Active Problem  List   Diagnosis Date Noted  . Supervision of high risk pregnancy, antepartum 01/10/2017  . History of preterm delivery, currently pregnant 01/10/2017  . ASCUS with positive high risk HPV cervical 01/10/2017  . Maternal varicella, non-immune 01/10/2017  . S/P emergency C-section 12/17/2014  . Preterm premature rupture of membranes (PPROM) with unknown onset of labor 12/11/2014  . Cervical incompetence during pregnancy in second trimester 12/09/2014  . No prenatal care in current pregnancy in second trimester 12/09/2014  . Trichomonal vaginitis during pregnancy in second trimester 12/09/2014     Plan:     1. Supervision of high risk pregnancy, antepartum - Flu Vaccine QUAD 36+ mos IM - [place] MFM OB Transvaginal; Future  2. History of preterm delivery, currently pregnant - vaginal prometrium to start today - [place] MFM OB Transvaginal; Future - pelvic rest discussed  3. ASCUS with positive high risk HPV cervical Will need post partum colpo  4. Maternal varicella, non-immune - needs vaccine post partum  5. Cervical incompetence during pregnancy in second trimester - vaginal prometrium - [place] MFM OB Transvaginal; Future  Initial labs reviewed from health department Prenatal vitamins. Problem list reviewed and updated. Role of ultrasound in pregnancy discussed; fetal survey: results requested from health department. Follow up in 2 weeks.  50% of 30 min visit spent on counseling and coordination of care.     Conan Bowens 01/10/2017

## 2017-01-11 ENCOUNTER — Other Ambulatory Visit: Payer: Self-pay | Admitting: Obstetrics and Gynecology

## 2017-01-11 ENCOUNTER — Ambulatory Visit (HOSPITAL_COMMUNITY)
Admission: RE | Admit: 2017-01-11 | Discharge: 2017-01-11 | Disposition: A | Discharge status: Home / Self Care | Payer: Medicaid Other | Source: Ambulatory Visit | Attending: Obstetrics and Gynecology | Admitting: Obstetrics and Gynecology

## 2017-01-11 ENCOUNTER — Other Ambulatory Visit (HOSPITAL_COMMUNITY): Payer: Self-pay | Admitting: *Deleted

## 2017-01-11 ENCOUNTER — Encounter (HOSPITAL_COMMUNITY): Payer: Self-pay

## 2017-01-11 DIAGNOSIS — Z3A25 25 weeks gestation of pregnancy: Secondary | ICD-10-CM

## 2017-01-11 DIAGNOSIS — O09899 Supervision of other high risk pregnancies, unspecified trimester: Secondary | ICD-10-CM

## 2017-01-11 DIAGNOSIS — Z3686 Encounter for antenatal screening for cervical length: Secondary | ICD-10-CM

## 2017-01-11 DIAGNOSIS — Z3689 Encounter for other specified antenatal screening: Secondary | ICD-10-CM | POA: Diagnosis not present

## 2017-01-11 DIAGNOSIS — IMO0002 Reserved for concepts with insufficient information to code with codable children: Secondary | ICD-10-CM

## 2017-01-11 DIAGNOSIS — O99212 Obesity complicating pregnancy, second trimester: Secondary | ICD-10-CM

## 2017-01-11 DIAGNOSIS — O34219 Maternal care for unspecified type scar from previous cesarean delivery: Secondary | ICD-10-CM

## 2017-01-11 DIAGNOSIS — O09219 Supervision of pregnancy with history of pre-term labor, unspecified trimester: Secondary | ICD-10-CM

## 2017-01-11 DIAGNOSIS — O09212 Supervision of pregnancy with history of pre-term labor, second trimester: Secondary | ICD-10-CM | POA: Insufficient documentation

## 2017-01-11 DIAGNOSIS — O0990 Supervision of high risk pregnancy, unspecified, unspecified trimester: Secondary | ICD-10-CM

## 2017-01-11 DIAGNOSIS — O34211 Maternal care for low transverse scar from previous cesarean delivery: Secondary | ICD-10-CM | POA: Diagnosis not present

## 2017-01-11 DIAGNOSIS — Z0489 Encounter for examination and observation for other specified reasons: Secondary | ICD-10-CM

## 2017-01-11 DIAGNOSIS — O3432 Maternal care for cervical incompetence, second trimester: Secondary | ICD-10-CM

## 2017-01-12 ENCOUNTER — Encounter: Payer: Self-pay | Admitting: *Deleted

## 2017-01-12 ENCOUNTER — Encounter: Payer: Medicaid Other | Admitting: Obstetrics and Gynecology

## 2017-01-24 ENCOUNTER — Encounter: Payer: Self-pay | Admitting: Obstetrics & Gynecology

## 2017-01-24 ENCOUNTER — Ambulatory Visit (INDEPENDENT_AMBULATORY_CARE_PROVIDER_SITE_OTHER): Payer: Medicaid Other | Admitting: Obstetrics & Gynecology

## 2017-01-24 VITALS — BP 125/67 | HR 102 | Wt 268.4 lb

## 2017-01-24 DIAGNOSIS — O0990 Supervision of high risk pregnancy, unspecified, unspecified trimester: Secondary | ICD-10-CM

## 2017-01-24 DIAGNOSIS — Z23 Encounter for immunization: Secondary | ICD-10-CM

## 2017-01-24 DIAGNOSIS — O3432 Maternal care for cervical incompetence, second trimester: Secondary | ICD-10-CM

## 2017-01-24 DIAGNOSIS — O0992 Supervision of high risk pregnancy, unspecified, second trimester: Secondary | ICD-10-CM

## 2017-01-24 DIAGNOSIS — O09219 Supervision of pregnancy with history of pre-term labor, unspecified trimester: Principal | ICD-10-CM

## 2017-01-24 DIAGNOSIS — O09899 Supervision of other high risk pregnancies, unspecified trimester: Secondary | ICD-10-CM

## 2017-01-24 DIAGNOSIS — O09212 Supervision of pregnancy with history of pre-term labor, second trimester: Secondary | ICD-10-CM

## 2017-01-24 MED ORDER — TETANUS-DIPHTH-ACELL PERTUSSIS 5-2.5-18.5 LF-MCG/0.5 IM SUSP
0.5000 mL | Freq: Once | INTRAMUSCULAR | Status: AC
  Administered 2017-01-24: 0.5 mL via INTRAMUSCULAR

## 2017-01-24 NOTE — Progress Notes (Signed)
PRENATAL VISIT NOTE  Subjective:  Melinda Richardson is a 21 y.o. G2P0100 at [redacted]w[redacted]d being seen today for ongoing prenatal care.  She is currently monitored for the following issues for this high-risk pregnancy and has Cervical incompetence during pregnancy in second trimester; Preterm premature rupture of membranes (PPROM) with unknown onset of labor; S/P emergency C-section; Supervision of high risk pregnancy, antepartum; History of preterm delivery, currently pregnant; ASCUS with positive high risk HPV cervical; and Maternal varicella, non-immune on her problem list.  Patient reports no complaints.  Contractions: Not present. Vag. Bleeding: None.  Movement: Present. Denies leaking of fluid.   The following portions of the patient's history were reviewed and updated as appropriate: allergies, current medications, past family history, past medical history, past social history, past surgical history and problem list. Problem list updated.  Objective:   Vitals:   01/24/17 1057  BP: 125/67  Pulse: (!) 102  Weight: 268 lb 6.4 oz (121.7 kg)    Fetal Status: Fetal Heart Rate (bpm): 154 Fundal Height: 28 cm Movement: Present     General:  Alert, oriented and cooperative. Patient is in no acute distress.  Skin: Skin is warm and dry. No rash noted.   Cardiovascular: Normal heart rate noted  Respiratory: Normal respiratory effort, no problems with respiration noted  Abdomen: Soft, gravid, appropriate for gestational age.  Pain/Pressure: Present     Pelvic: Cervical exam deferred        Extremities: Normal range of motion.  Edema: None  Mental Status:  Normal mood and affect. Normal behavior. Normal judgment and thought content.   [place] Mfm Ob Transvaginal  Result Date: 01/11/2017 ----------------------------------------------------------------------  OBSTETRICS REPORT                      (Signed Final 01/11/2017 11:45 am) ----------------------------------------------------------------------  Patient Info  ID #:       [id number]                          D.O.B.:  04-Feb-1996 (20 yrs)  Name:       Melinda Richardson                Visit Date: 01/11/2017 11:24 am ---------------------------------------------------------------------- Performed By  Performed By:     Eden Lathe BS      Ref. Address:     [facility]                    RDMS RVT                                                             OB/Gyn Clinic                                                             [address]                                                             [place], [place]                                                             [address]  Attending:        Darlyn Read MD         Location:         [facility]  Referred By:      [facility] for                    [facility]                    Healthcare ---------------------------------------------------------------------- Orders   #  Description                                 Code   1  [place] MFM OB TRANSVAGINAL                      60454.0   2  [place] MFM OB DETAIL +14 WK                     76811.01  ----------------------------------------------------------------------   #  Ordered By               Order #        Accession #    Episode #   1  Particia Nearing            [id number]      [id number]     [id number]   2  MARTHA DECKER            [id number]      [id number]     [id number]  ---------------------------------------------------------------------- Indications   [redacted] weeks gestation of pregnancy                Z3A.25   Encounter for fetal anatomic survey            Z36.89   Poor obstetric history: Previous preterm       O09.219   delivery, antepartum (24 weeks - ND)   Encounter for cervical length                  Z36.86   Obesity complicating pregnancy, second         O99.212   trimester (pregravid BMI 42)   Previous cesarean delivery, antepartum         O34.219   (classical)   ---------------------------------------------------------------------- OB History  Gravidity:    2         Term:   0        Prem:   1        SAB:   0  TOP:  0       Ectopic:  0        Living: 0 ---------------------------------------------------------------------- Fetal Evaluation  Num Of Fetuses:     1  Fetal Heart         161  Rate(bpm):  Cardiac Activity:   Observed  Presentation:       Cephalic  Placenta:           Posterior, above cervical os  P. Cord Insertion:  Visualized  Amniotic Fluid  AFI FV:      Subjectively within normal limits                              Largest Pocket(cm)                              4.42 ---------------------------------------------------------------------- Biometry  BPD:      61.5  mm     G. Age:  25w 0d         36  %    CI:        67.83   %    70 - 86                                                          FL/HC:      18.6   %    18.7 - 20.3  HC:      238.9  mm     G. Age:  26w 0d         57  %    HC/AC:      1.20        1.04 - 1.22  AC:      198.4  mm     G. Age:  24w 4d         23  %    FL/BPD:     72.4   %    71 - 87  FL:       44.5  mm     G. Age:  24w 5d         23  %    FL/AC:      22.4   %    20 - 24  HUM:      42.5  mm     G. Age:  25w 4d         52  %  CER:        29  mm     G. Age:  26w 0d         62  %  CM:          3  mm  Est. FW:     727  gm    1 lb 10 oz      44  % ---------------------------------------------------------------------- Gestational Age  LMP:           25w 1d        Date:  07/19/16                 EDD:   04/25/17  U/S Today:     25w 1d  EDD:   04/25/17  Best:          25w 1d     Det. By:  LMP  (07/19/16)          EDD:   04/25/17 ---------------------------------------------------------------------- Anatomy  Cranium:               Appears normal         Aortic Arch:            Appears normal  Cavum:                 Appears normal         Ductal Arch:            Not well visualized  Ventricles:             Appears normal         Diaphragm:              Appears normal  Choroid Plexus:        Appears normal         Stomach:                Appears normal, left                                                                        sided  Cerebellum:            Appears normal         Abdomen:                Appears normal  Posterior Fossa:       Appears normal         Abdominal Wall:         Appears nml (cord                                                                        insert, abd wall)  Nuchal Fold:           Not applicable (>20    Cord Vessels:           Appears normal ([redacted]                         wks GA)                                        vessel cord)  Face:                  Appears normal         Kidneys:                Appear normal                         (orbits and profile)  Lips:                  Appears normal         Bladder:                Appears normal  Thoracic:              Appears normal         Spine:                  Appears normal  Heart:                 Appears normal         Upper Extremities:      Appears normal                         (4CH, axis, and                         situs)  RVOT:                  Appears normal         Lower Extremities:      Appears normal  LVOT:                  Appears normal  Other:  Heels and 5th digit visualized. Nasal bone visualized. Technically          difficult due to maternal habitus and fetal position. ---------------------------------------------------------------------- Cervix Uterus Adnexa  Cervix  Length:              3  cm.  Measured transvaginally.  Uterus  No abnormality visualized.  Left Ovary  Not visualized.  Right Ovary  Within normal limits.  Cul De Sac:   No free fluid seen.  Adnexa:       No abnormality visualized. ---------------------------------------------------------------------- Impression  Single living intrauterine pregnancy at 25w 1d.  Placenta Posterior, above cervical os.  Appropriate fetal growth.  Normal amniotic fluid  volume.  The ductal arch and fetal sex are not well visualized.  The fetal anatomic survey is otherwise complete.  Normal fetal anatomy.  No fetal anomalies or soft markers of aneuploidy seen.  The cervix measures 3cm transvaginally without funneling or  debris. ---------------------------------------------------------------------- Recommendations  Recommend follow-up ultrasound examination in 6 weeks for  reassessment of fetal growth and anatomy.  No further ultrasounds for cervical length recommended at  this gestational age. ----------------------------------------------------------------------                   Darlyn ReadEmily Bunce, MD Electronically Signed Final Report   01/11/2017 11:45 am ----------------------------------------------------------------------  [place] Mfm Ob Detail +14 Wk  Result Date: 01/11/2017 ----------------------------------------------------------------------  OBSTETRICS REPORT                      (Signed Final 01/11/2017 11:45 am) ---------------------------------------------------------------------- Patient Info  ID #:       [id number]                          D.O.B.:  12/11/1995 (20 yrs)  Name:       Delfina RedwoodCAMARY N Sullenger                Visit Date: 01/11/2017 11:24 am ---------------------------------------------------------------------- Performed By  Performed By:     Eden Lathearrie Stalter BS  Ref. Address:     [facility]                    RDMS RVT                                                             OB/Gyn Clinic                                                             [address]                                                             [place], [place]                                                             [address]  Attending:        Darlyn Read MD         Location:         [facility]  Referred By:      [facility] for                    [facility]                    Healthcare  ---------------------------------------------------------------------- Orders   #  Description                                 Code   1  [place] MFM [facility] TRANSVAGINAL                      11914.7   2  [place] MFM OB DETAIL +14 WK                     82956.21  ----------------------------------------------------------------------   #  Ordered By               Order #        Accession #    Episode #   1  Particia Nearing            [id number]  [id number]     [id number]   2  MARTHA DECKER            [id number]      [id number]     [id number]  ---------------------------------------------------------------------- Indications   [redacted] weeks gestation of pregnancy                Z3A.25   Encounter for fetal anatomic survey            Z36.89   Poor obstetric history: Previous preterm       O09.219   delivery, antepartum (24 weeks - ND)   Encounter for cervical length                  Z36.86   Obesity complicating pregnancy, second         O99.212   trimester (pregravid BMI 42)   Previous cesarean delivery, antepartum         O34.219   (classical)  ---------------------------------------------------------------------- OB History  Gravidity:    2         Term:   0        Prem:   1        SAB:   0  TOP:          0       Ectopic:  0        Living: 0 ---------------------------------------------------------------------- Fetal Evaluation  Num Of Fetuses:     1  Fetal Heart         161  Rate(bpm):  Cardiac Activity:   Observed  Presentation:       Cephalic  Placenta:           Posterior, above cervical os  P. Cord Insertion:  Visualized  Amniotic Fluid  AFI FV:      Subjectively within normal limits                              Largest Pocket(cm)                              4.42 ---------------------------------------------------------------------- Biometry  BPD:      61.5  mm     G. Age:  25w 0d         36  %    CI:        67.83   %    70 - 86                                                          FL/HC:      18.6   %    18.7 - 20.3  HC:       238.9  mm     G. Age:  26w 0d         57  %    HC/AC:      1.20        1.04 - 1.22  AC:      198.4  mm     G. Age:  24w 4d         23  %    FL/BPD:     72.4   %  71 - 87  FL:       44.5  mm     G. Age:  24w 5d         23  %    FL/AC:      22.4   %    20 - 24  HUM:      42.5  mm     G. Age:  25w 4d         52  %  CER:        29  mm     G. Age:  26w 0d         62  %  CM:          3  mm  Est. FW:     727  gm    1 lb 10 oz      44  % ---------------------------------------------------------------------- Gestational Age  LMP:           25w 1d        Date:  07/19/16                 EDD:   04/25/17  U/S Today:     25w 1d                                        EDD:   04/25/17  Best:          25w 1d     Det. By:  LMP  (07/19/16)          EDD:   04/25/17 ---------------------------------------------------------------------- Anatomy  Cranium:               Appears normal         Aortic Arch:            Appears normal  Cavum:                 Appears normal         Ductal Arch:            Not well visualized  Ventricles:            Appears normal         Diaphragm:              Appears normal  Choroid Plexus:        Appears normal         Stomach:                Appears normal, left                                                                        sided  Cerebellum:            Appears normal         Abdomen:                Appears normal  Posterior Fossa:       Appears normal         Abdominal Wall:         Appears nml (cord  insert, abd wall)  Nuchal Fold:           Not applicable (>20    Cord Vessels:           Appears normal ([redacted]                         wks GA)                                        vessel cord)  Face:                  Appears normal         Kidneys:                Appear normal                         (orbits and profile)  Lips:                  Appears normal         Bladder:                Appears normal  Thoracic:               Appears normal         Spine:                  Appears normal  Heart:                 Appears normal         Upper Extremities:      Appears normal                         (4CH, axis, and                         situs)  RVOT:                  Appears normal         Lower Extremities:      Appears normal  LVOT:                  Appears normal  Other:  Heels and 5th digit visualized. Nasal bone visualized. Technically          difficult due to maternal habitus and fetal position. ---------------------------------------------------------------------- Cervix Uterus Adnexa  Cervix  Length:              3  cm.  Measured transvaginally.  Uterus  No abnormality visualized.  Left Ovary  Not visualized.  Right Ovary  Within normal limits.  Cul De Sac:   No free fluid seen.  Adnexa:       No abnormality visualized. ---------------------------------------------------------------------- Impression  Single living intrauterine pregnancy at 25w 1d.  Placenta Posterior, above cervical os.  Appropriate fetal growth.  Normal amniotic fluid volume.  The ductal arch and fetal sex are not well visualized.  The fetal anatomic survey is otherwise complete.  Normal fetal anatomy.  No fetal anomalies or soft markers of aneuploidy seen.  The cervix measures 3cm transvaginally without funneling or  debris. ---------------------------------------------------------------------- Recommendations  Recommend follow-up ultrasound examination in 6 weeks for  reassessment of fetal growth  and anatomy.  No further ultrasounds for cervical length recommended at  this gestational age. ----------------------------------------------------------------------                   Darlyn Read, MD Electronically Signed Final Report   01/11/2017 11:45 am ----------------------------------------------------------------------   Assessment and Plan:  Pregnancy: G2P0100 at [redacted]w[redacted]d  1. History of preterm delivery, currently pregnant 2. Cervical incompetence during  pregnancy in second trimester Continue vaginal prometrium  3. Supervision of high risk pregnancy, antepartum Third trimester labs next visit. Vaccines today. - Enroll Patient in [employer] - Tdap (BOOSTRIX) injection 0.5 mL; Inject 0.5 mLs into the muscle once. - Flu Vaccine QUAD 36+ mos IM (Fluarix, Quad PF) Work restrictions letter given to patient as per her request. Preterm labor symptoms and general obstetric precautions including but not limited to vaginal bleeding, contractions, leaking of fluid and fetal movement were reviewed in detail with the patient. Please refer to After Visit Summary for other counseling recommendations.  Return in about 2 weeks (around 02/07/2017) for 2 hr GTT, 3rd trimester labs, OB Visit (HOB).   Jaynie Collins, MD

## 2017-01-24 NOTE — Patient Instructions (Signed)
Return to clinic for any scheduled appointments or obstetric concerns, or go to [facility] for evaluation  

## 2017-01-27 ENCOUNTER — Emergency Department (HOSPITAL_COMMUNITY)
Admission: EM | Admit: 2017-01-27 | Discharge: 2017-01-27 | Disposition: A | Discharge status: Home / Self Care | Payer: Medicaid Other | Attending: Emergency Medicine | Admitting: Emergency Medicine

## 2017-01-27 ENCOUNTER — Encounter (HOSPITAL_COMMUNITY): Payer: Self-pay | Admitting: Emergency Medicine

## 2017-01-27 DIAGNOSIS — R12 Heartburn: Secondary | ICD-10-CM | POA: Diagnosis present

## 2017-01-27 DIAGNOSIS — Z5321 Procedure and treatment not carried out due to patient leaving prior to being seen by health care provider: Secondary | ICD-10-CM | POA: Diagnosis not present

## 2017-01-27 LAB — BASIC METABOLIC PANEL
Anion gap: 8 (ref 5–15)
BUN: 6 mg/dL (ref 6–20)
CALCIUM: 8.8 mg/dL — AB (ref 8.9–10.3)
CO2: 23 mmol/L (ref 22–32)
Chloride: 103 mmol/L (ref 101–111)
Creatinine, Ser: 0.56 mg/dL (ref 0.44–1.00)
GFR calc Af Amer: >60 mL/min (ref >60)
GLUCOSE: 98 mg/dL (ref 65–99)
Potassium: 3.7 mmol/L (ref 3.5–5.1)
Sodium: 134 mmol/L — ABNORMAL LOW (ref 135–145)

## 2017-01-27 LAB — CBC
HCT: 28.7 % — ABNORMAL LOW (ref 36.0–46.0)
Hemoglobin: 9.2 g/dL — ABNORMAL LOW (ref 12.0–15.0)
MCH: 27.9 pg (ref 26.0–34.0)
MCHC: 32.1 g/dL (ref 30.0–36.0)
MCV: 87 fL (ref 78.0–100.0)
Platelets: 245 10*3/uL (ref 150–400)
RBC: 3.3 MIL/uL — ABNORMAL LOW (ref 3.87–5.11)
RDW: 13.1 % (ref 11.5–15.5)
WBC: 7.2 10*3/uL (ref 4.0–10.5)

## 2017-01-27 LAB — I-STAT TROPONIN, ED: TROPONIN I, POC: 0 ng/mL (ref 0.00–0.08)

## 2017-01-27 NOTE — ED Notes (Signed)
Patient states that she is not waiting any longer, she has to go to work.

## 2017-01-27 NOTE — ED Notes (Signed)
Fetal heart tones 152

## 2017-01-27 NOTE — ED Triage Notes (Addendum)
Pt states she is [redacted] weeks pregnant and has had heartburn for a few weeks. Pt pointing to epigastric region. Pt states baby is moving per usual, [facility]. Pt has not tried any over the counter antacids.

## 2017-02-09 ENCOUNTER — Encounter: Payer: Medicaid Other | Admitting: Family Medicine

## 2017-02-15 ENCOUNTER — Ambulatory Visit (INDEPENDENT_AMBULATORY_CARE_PROVIDER_SITE_OTHER): Payer: Medicaid Other | Admitting: Family Medicine

## 2017-02-15 VITALS — BP 124/62 | HR 97 | Wt 271.8 lb

## 2017-02-15 DIAGNOSIS — O09213 Supervision of pregnancy with history of pre-term labor, third trimester: Secondary | ICD-10-CM

## 2017-02-15 DIAGNOSIS — Z98891 History of uterine scar from previous surgery: Secondary | ICD-10-CM

## 2017-02-15 DIAGNOSIS — O0993 Supervision of high risk pregnancy, unspecified, third trimester: Secondary | ICD-10-CM

## 2017-02-15 DIAGNOSIS — O34219 Maternal care for unspecified type scar from previous cesarean delivery: Secondary | ICD-10-CM

## 2017-02-15 DIAGNOSIS — O09219 Supervision of pregnancy with history of pre-term labor, unspecified trimester: Principal | ICD-10-CM

## 2017-02-15 DIAGNOSIS — O09899 Supervision of other high risk pregnancies, unspecified trimester: Secondary | ICD-10-CM

## 2017-02-15 DIAGNOSIS — O0990 Supervision of high risk pregnancy, unspecified, unspecified trimester: Secondary | ICD-10-CM

## 2017-02-15 MED ORDER — TETANUS-DIPHTH-ACELL PERTUSSIS 5-2.5-18.5 LF-MCG/0.5 IM SUSP: 0.5000 mL | Freq: Once | INTRAMUSCULAR | Status: DC

## 2017-02-15 NOTE — Patient Instructions (Signed)
Breastfeeding Deciding to breastfeed is one of the best choices you can make for you and your baby. A change in hormones during pregnancy causes your breast tissue to grow and increases the number and size of your milk ducts. These hormones also allow proteins, sugars, and fats from your blood supply to make breast milk in your milk-producing glands. Hormones prevent breast milk from being released before your baby is born as well as prompt milk flow after birth. Once breastfeeding has begun, thoughts of your baby, as well as his or her sucking or crying, can stimulate the release of milk from your milk-producing glands. Benefits of breastfeeding For Your Baby  Your first milk (colostrum) helps your baby's digestive system function better.  There are antibodies in your milk that help your baby fight off infections.  Your baby has a lower incidence of asthma, allergies, and sudden infant death syndrome.  The nutrients in breast milk are better for your baby than infant formulas and are designed uniquely for your baby's needs.  Breast milk improves your baby's brain development.  Your baby is less likely to develop other conditions, such as childhood obesity, asthma, or type 2 diabetes mellitus.  For You  Breastfeeding helps to create a very special bond between you and your baby.  Breastfeeding is convenient. Breast milk is always available at the correct temperature and costs nothing.  Breastfeeding helps to burn calories and helps you lose the weight gained during pregnancy.  Breastfeeding makes your uterus contract to its prepregnancy size faster and slows bleeding (lochia) after you give birth.  Breastfeeding helps to lower your risk of developing type 2 diabetes mellitus, osteoporosis, and breast or ovarian cancer later in life.  Signs that your baby is hungry Early Signs of Hunger  Increased alertness or activity.  Stretching.  Movement of the head from side to  side.  Movement of the head and opening of the mouth when the corner of the mouth or cheek is stroked (rooting).  Increased sucking sounds, smacking lips, cooing, sighing, or squeaking.  Hand-to-mouth movements.  Increased sucking of fingers or hands.  Late Signs of Hunger  Fussing.  Intermittent crying.  Extreme Signs of Hunger Signs of extreme hunger will require calming and consoling before your baby will be able to breastfeed successfully. Do not wait for the following signs of extreme hunger to occur before you initiate breastfeeding:  Restlessness.  A loud, strong cry.  Screaming.  Breastfeeding basics Breastfeeding Initiation  Find a comfortable place to sit or lie down, with your neck and back well supported.  Place a pillow or rolled up blanket under your baby to bring him or her to the level of your breast (if you are seated). Nursing pillows are specially designed to help support your arms and your baby while you breastfeed.  Make sure that your baby's abdomen is facing your abdomen.  Gently massage your breast. With your fingertips, massage from your chest wall toward your nipple in a circular motion. This encourages milk flow. You may need to continue this action during the feeding if your milk flows slowly.  Support your breast with 4 fingers underneath and your thumb above your nipple. Make sure your fingers are well away from your nipple and your baby's mouth.  Stroke your baby's lips gently with your finger or nipple.  When your baby's mouth is open wide enough, quickly bring your baby to your breast, placing your entire nipple and as much of the colored area   around your nipple (areola) as possible into your baby's mouth. ? More areola should be visible above your baby's upper lip than below the lower lip. ? Your baby's tongue should be between his or her lower gum and your breast.  Ensure that your baby's mouth is correctly positioned around your nipple  (latched). Your baby's lips should create a seal on your breast and be turned out (everted).  It is common for your baby to suck about 2-3 minutes in order to start the flow of breast milk.  Latching Teaching your baby how to latch on to your breast properly is very important. An improper latch can cause nipple pain and decreased milk supply for you and poor weight gain in your baby. Also, if your baby is not latched onto your nipple properly, he or she may swallow some air during feeding. This can make your baby fussy. Burping your baby when you switch breasts during the feeding can help to get rid of the air. However, teaching your baby to latch on properly is still the best way to prevent fussiness from swallowing air while breastfeeding. Signs that your baby has successfully latched on to your nipple:  Silent tugging or silent sucking, without causing you pain.  Swallowing heard between every 3-4 sucks.  Muscle movement above and in front of his or her ears while sucking.  Signs that your baby has not successfully latched on to nipple:  Sucking sounds or smacking sounds from your baby while breastfeeding.  Nipple pain.  If you think your baby has not latched on correctly, slip your finger into the corner of your baby's mouth to break the suction and place it between your baby's gums. Attempt breastfeeding initiation again. Signs of Successful Breastfeeding Signs from your baby:  A gradual decrease in the number of sucks or complete cessation of sucking.  Falling asleep.  Relaxation of his or her body.  Retention of a small amount of milk in his or her mouth.  Letting go of your breast by himself or herself.  Signs from you:  Breasts that have increased in firmness, weight, and size 1-3 hours after feeding.  Breasts that are softer immediately after breastfeeding.  Increased milk volume, as well as a change in milk consistency and color by the fifth day of  breastfeeding.  Nipples that are not sore, cracked, or bleeding.  Signs That Your Baby is Getting Enough Milk  Wetting at least 1-2 diapers during the first 24 hours after birth.  Wetting at least 5-6 diapers every 24 hours for the first week after birth. The urine should be clear or pale yellow by 5 days after birth.  Wetting 6-8 diapers every 24 hours as your baby continues to grow and develop.  At least 3 stools in a 24-hour period by age 5 days. The stool should be soft and yellow.  At least 3 stools in a 24-hour period by age 7 days. The stool should be seedy and yellow.  No loss of weight greater than 10% of birth weight during the first 3 days of age.  Average weight gain of 4-7 ounces (113-198 g) per week after age 4 days.  Consistent daily weight gain by age 5 days, without weight loss after the age of 2 weeks.  After a feeding, your baby may spit up a small amount. This is common. Breastfeeding frequency and duration Frequent feeding will help you make more milk and can prevent sore nipples and breast engorgement. Breastfeed when   you feel the need to reduce the fullness of your breasts or when your baby shows signs of hunger. This is called "breastfeeding on demand." Avoid introducing a pacifier to your baby while you are working to establish breastfeeding (the first 4-6 weeks after your baby is born). After this time you may choose to use a pacifier. Research has shown that pacifier use during the first year of a baby's life decreases the risk of sudden infant death syndrome (SIDS). Allow your baby to feed on each breast as long as he or she wants. Breastfeed until your baby is finished feeding. When your baby unlatches or falls asleep while feeding from the first breast, offer the second breast. Because newborns are often sleepy in the first few weeks of life, you may need to awaken your baby to get him or her to feed. Breastfeeding times will vary from baby to baby. However,  the following rules can serve as a guide to help you ensure that your baby is properly fed:  Newborns (babies 4 weeks of age or younger) may breastfeed every 1-3 hours.  Newborns should not go longer than 3 hours during the day or 5 hours during the night without breastfeeding.  You should breastfeed your baby a minimum of 8 times in a 24-hour period until you begin to introduce solid foods to your baby at around 6 months of age.  Breast milk pumping Pumping and storing breast milk allows you to ensure that your baby is exclusively fed your breast milk, even at times when you are unable to breastfeed. This is especially important if you are going back to work while you are still breastfeeding or when you are not able to be present during feedings. Your lactation consultant can give you guidelines on how long it is safe to store breast milk. A breast pump is a machine that allows you to pump milk from your breast into a sterile bottle. The pumped breast milk can then be stored in a refrigerator or freezer. Some breast pumps are operated by hand, while others use electricity. Ask your lactation consultant which type will work best for you. Breast pumps can be purchased, but some hospitals and breastfeeding support groups lease breast pumps on a monthly basis. A lactation consultant can teach you how to hand express breast milk, if you prefer not to use a pump. Caring for your breasts while you breastfeed Nipples can become dry, cracked, and sore while breastfeeding. The following recommendations can help keep your breasts moisturized and healthy:  Avoid using soap on your nipples.  Wear a supportive bra. Although not required, special nursing bras and tank tops are designed to allow access to your breasts for breastfeeding without taking off your entire bra or top. Avoid wearing underwire-style bras or extremely tight bras.  Air dry your nipples for 3-4minutes after each feeding.  Use only cotton  bra pads to absorb leaked breast milk. Leaking of breast milk between feedings is normal.  Use lanolin on your nipples after breastfeeding. Lanolin helps to maintain your skin's normal moisture barrier. If you use pure lanolin, you do not need to wash it off before feeding your baby again. Pure lanolin is not toxic to your baby. You may also hand express a few drops of breast milk and gently massage that milk into your nipples and allow the milk to air dry.  In the first few weeks after giving birth, some women experience extremely full breasts (engorgement). Engorgement can make your   breasts feel heavy, warm, and tender to the touch. Engorgement peaks within 3-5 days after you give birth. The following recommendations can help ease engorgement:  Completely empty your breasts while breastfeeding or pumping. You may want to start by applying warm, moist heat (in the shower or with warm water-soaked hand towels) just before feeding or pumping. This increases circulation and helps the milk flow. If your baby does not completely empty your breasts while breastfeeding, pump any extra milk after he or she is finished.  Wear a snug bra (nursing or regular) or tank top for 1-2 days to signal your body to slightly decrease milk production.  Apply ice packs to your breasts, unless this is too uncomfortable for you.  Make sure that your baby is latched on and positioned properly while breastfeeding.  If engorgement persists after 48 hours of following these recommendations, contact your health care provider or a lactation consultant. Overall health care recommendations while breastfeeding  Eat healthy foods. Alternate between meals and snacks, eating 3 of each per day. Because what you eat affects your breast milk, some of the foods may make your baby more irritable than usual. Avoid eating these foods if you are sure that they are negatively affecting your baby.  Drink milk, fruit juice, and water to  satisfy your thirst (about 10 glasses a day).  Rest often, relax, and continue to take your prenatal vitamins to prevent fatigue, stress, and anemia.  Continue breast self-awareness checks.  Avoid chewing and smoking tobacco. Chemicals from cigarettes that pass into breast milk and exposure to secondhand smoke may harm your baby.  Avoid alcohol and drug use, including marijuana. Some medicines that may be harmful to your baby can pass through breast milk. It is important to ask your health care provider before taking any medicine, including all over-the-counter and prescription medicine as well as vitamin and herbal supplements. It is possible to become pregnant while breastfeeding. If birth control is desired, ask your health care provider about options that will be safe for your baby. Contact a health care provider if:  You feel like you want to stop breastfeeding or have become frustrated with breastfeeding.  You have painful breasts or nipples.  Your nipples are cracked or bleeding.  Your breasts are red, tender, or warm.  You have a swollen area on either breast.  You have a fever or chills.  You have nausea or vomiting.  You have drainage other than breast milk from your nipples.  Your breasts do not become full before feedings by the fifth day after you give birth.  You feel sad and depressed.  Your baby is too sleepy to eat well.  Your baby is having trouble sleeping.  Your baby is wetting less than 3 diapers in a 24-hour period.  Your baby has less than 3 stools in a 24-hour period.  Your baby's skin or the white part of his or her eyes becomes yellow.  Your baby is not gaining weight by 5 days of age. Get help right away if:  Your baby is overly tired (lethargic) and does not want to wake up and feed.  Your baby develops an unexplained fever. This information is not intended to replace advice given to you by your health care provider. Make sure you discuss  any questions you have with your health care provider. Document Released: 03/27/2005 Document Revised: 09/08/2015 Document Reviewed: 09/18/2012 Elsevier Interactive Patient Education  2017 [employer]  

## 2017-02-15 NOTE — Progress Notes (Signed)
Educated pt on Good Latch  28 wk packet given

## 2017-02-16 ENCOUNTER — Encounter (HOSPITAL_COMMUNITY): Payer: Self-pay

## 2017-02-16 NOTE — Progress Notes (Signed)
   PRENATAL VISIT NOTE  Subjective:  Melinda Richardson is a 21 y.o. G2P0100 at 6737w2d being seen today for ongoing prenatal care.  She is currently monitored for the following issues for this high-risk pregnancy and has Cervical incompetence during pregnancy in second trimester; Preterm premature rupture of membranes (PPROM) with unknown onset of labor; S/P emergency C-section; Supervision of high risk pregnancy, antepartum; History of preterm delivery, currently pregnant; ASCUS with positive high risk HPV cervical; and Maternal varicella, non-immune on their problem list.  Patient reports no complaints.  Contractions: Not present. Vag. Bleeding: None.  Movement: Present. Denies leaking of fluid.   The following portions of the patient's history were reviewed and updated as appropriate: allergies, current medications, past family history, past medical history, past social history, past surgical history and problem list. Problem list updated.  Objective:   Vitals:   02/15/17 1541  BP: 124/62  Pulse: 97  Weight: 271 lb 12.8 oz (123.3 kg)    Fetal Status: Fetal Heart Rate (bpm): 154 Fundal Height: 29 cm Movement: Present     General:  Alert, oriented and cooperative. Patient is in no acute distress.  Skin: Skin is warm and dry. No rash noted.   Cardiovascular: Normal heart rate noted  Respiratory: Normal respiratory effort, no problems with respiration noted  Abdomen: Soft, gravid, appropriate for gestational age.  Pain/Pressure: Present     Pelvic: Cervical exam deferred        Extremities: Normal range of motion.  Edema: None  Mental Status:  Normal mood and affect. Normal behavior. Normal judgment and thought content.   Assessment and Plan:  Pregnancy: G2P0100 at 137w2d 1. History of preterm delivery, currently pregnant Too late for Makena--continue Prometrium  2. S/P emergency C-section Due to classical Section--will need RCS at 37 wks--booked today  3. Supervision of high risk  pregnancy, antepartum Continue prenatal care.   Preterm labor symptoms and general obstetric precautions including but not limited to vaginal bleeding, contractions, leaking of fluid and fetal movement were reviewed in detail with the patient. Please refer to After Visit Summary for other counseling recommendations.  Return in 2 weeks (on 03/01/2017) for 28 wk labs separate with fasting in am.   Reva Boresanya S Canaan Prue, MD

## 2017-02-19 ENCOUNTER — Other Ambulatory Visit: Payer: Self-pay | Admitting: Family Medicine

## 2017-02-22 ENCOUNTER — Ambulatory Visit (HOSPITAL_COMMUNITY)
Admission: RE | Admit: 2017-02-22 | Discharge: 2017-02-22 | Disposition: A | Discharge status: Home / Self Care | Payer: Medicaid Other | Source: Ambulatory Visit | Attending: Obstetrics and Gynecology | Admitting: Obstetrics and Gynecology

## 2017-02-22 ENCOUNTER — Encounter (HOSPITAL_COMMUNITY): Payer: Self-pay

## 2017-02-22 ENCOUNTER — Other Ambulatory Visit: Payer: Medicaid Other

## 2017-02-22 ENCOUNTER — Other Ambulatory Visit (HOSPITAL_COMMUNITY): Payer: Self-pay | Admitting: Maternal & Fetal Medicine

## 2017-02-22 DIAGNOSIS — O34219 Maternal care for unspecified type scar from previous cesarean delivery: Secondary | ICD-10-CM | POA: Insufficient documentation

## 2017-02-22 DIAGNOSIS — Z3A31 31 weeks gestation of pregnancy: Secondary | ICD-10-CM

## 2017-02-22 DIAGNOSIS — Z6841 Body Mass Index (BMI) 40.0 and over, adult: Secondary | ICD-10-CM | POA: Diagnosis not present

## 2017-02-22 DIAGNOSIS — O99213 Obesity complicating pregnancy, third trimester: Secondary | ICD-10-CM

## 2017-02-22 DIAGNOSIS — O09213 Supervision of pregnancy with history of pre-term labor, third trimester: Secondary | ICD-10-CM | POA: Diagnosis not present

## 2017-02-22 DIAGNOSIS — O09893 Supervision of other high risk pregnancies, third trimester: Secondary | ICD-10-CM

## 2017-02-22 DIAGNOSIS — Z0489 Encounter for examination and observation for other specified reasons: Secondary | ICD-10-CM

## 2017-02-22 DIAGNOSIS — E669 Obesity, unspecified: Secondary | ICD-10-CM | POA: Diagnosis not present

## 2017-02-22 DIAGNOSIS — IMO0002 Reserved for concepts with insufficient information to code with codable children: Secondary | ICD-10-CM

## 2017-02-22 DIAGNOSIS — O0993 Supervision of high risk pregnancy, unspecified, third trimester: Secondary | ICD-10-CM

## 2017-02-22 DIAGNOSIS — Z362 Encounter for other antenatal screening follow-up: Secondary | ICD-10-CM | POA: Insufficient documentation

## 2017-02-22 DIAGNOSIS — Z98891 History of uterine scar from previous surgery: Secondary | ICD-10-CM

## 2017-02-23 LAB — GLUCOSE TOLERANCE, 2 HOURS W/ 1HR
GLUCOSE, 1 HOUR: 152 mg/dL (ref 65–179)
GLUCOSE, 2 HOUR: 117 mg/dL (ref 65–152)
Glucose, Fasting: 84 mg/dL (ref 65–91)

## 2017-02-23 LAB — CBC
Hematocrit: 29.4 % — ABNORMAL LOW (ref 34.0–46.6)
Hemoglobin: 9.8 g/dL — ABNORMAL LOW (ref 11.1–15.9)
MCH: 28.7 pg (ref 26.6–33.0)
MCHC: 33.3 g/dL (ref 31.5–35.7)
MCV: 86 fL (ref 79–97)
PLATELETS: 255 10*3/uL (ref 150–379)
RBC: 3.41 x10E6/uL — ABNORMAL LOW (ref 3.77–5.28)
RDW: 13.5 % (ref 12.3–15.4)
WBC: 8.2 10*3/uL (ref 3.4–10.8)

## 2017-02-23 LAB — HIV ANTIBODY (ROUTINE TESTING W REFLEX): HIV Screen 4th Generation wRfx: NONREACTIVE

## 2017-02-23 LAB — RPR: RPR: NONREACTIVE

## 2017-03-08 ENCOUNTER — Encounter: Payer: Medicaid Other | Admitting: Obstetrics & Gynecology

## 2017-03-16 ENCOUNTER — Other Ambulatory Visit: Payer: Self-pay

## 2017-03-16 ENCOUNTER — Encounter (HOSPITAL_COMMUNITY): Payer: Self-pay

## 2017-03-16 ENCOUNTER — Emergency Department (HOSPITAL_COMMUNITY)
Admission: EM | Admit: 2017-03-16 | Discharge: 2017-03-16 | Disposition: A | Discharge status: Home / Self Care | Payer: Medicaid Other | Attending: Emergency Medicine | Admitting: Emergency Medicine

## 2017-03-16 DIAGNOSIS — Z3A34 34 weeks gestation of pregnancy: Secondary | ICD-10-CM | POA: Insufficient documentation

## 2017-03-16 DIAGNOSIS — R87810 Cervical high risk human papillomavirus (HPV) DNA test positive: Secondary | ICD-10-CM

## 2017-03-16 DIAGNOSIS — O09219 Supervision of pregnancy with history of pre-term labor, unspecified trimester: Secondary | ICD-10-CM

## 2017-03-16 DIAGNOSIS — Z862 Personal history of diseases of the blood and blood-forming organs and certain disorders involving the immune mechanism: Secondary | ICD-10-CM | POA: Diagnosis not present

## 2017-03-16 DIAGNOSIS — O26893 Other specified pregnancy related conditions, third trimester: Secondary | ICD-10-CM | POA: Insufficient documentation

## 2017-03-16 DIAGNOSIS — Z283 Underimmunization status: Secondary | ICD-10-CM

## 2017-03-16 DIAGNOSIS — R87610 Atypical squamous cells of undetermined significance on cytologic smear of cervix (ASC-US): Secondary | ICD-10-CM

## 2017-03-16 DIAGNOSIS — Z2839 Other underimmunization status: Secondary | ICD-10-CM

## 2017-03-16 DIAGNOSIS — O0990 Supervision of high risk pregnancy, unspecified, unspecified trimester: Secondary | ICD-10-CM

## 2017-03-16 DIAGNOSIS — R1084 Generalized abdominal pain: Secondary | ICD-10-CM

## 2017-03-16 DIAGNOSIS — O09899 Supervision of other high risk pregnancies, unspecified trimester: Secondary | ICD-10-CM

## 2017-03-16 LAB — CBC WITH DIFFERENTIAL/PLATELET
BASOS ABS: 0 10*3/uL (ref 0.0–0.1)
Basophils Relative: 0 %
Eosinophils Absolute: 0.1 10*3/uL (ref 0.0–0.7)
Eosinophils Relative: 1 %
HCT: 28.8 % — ABNORMAL LOW (ref 36.0–46.0)
Hemoglobin: 9.3 g/dL — ABNORMAL LOW (ref 12.0–15.0)
LYMPHS PCT: 25 %
Lymphs Abs: 2.2 10*3/uL (ref 0.7–4.0)
MCH: 27.6 pg (ref 26.0–34.0)
MCHC: 32.3 g/dL (ref 30.0–36.0)
MCV: 85.5 fL (ref 78.0–100.0)
Monocytes Absolute: 0.7 10*3/uL (ref 0.1–1.0)
Monocytes Relative: 8 %
NEUTROS ABS: 5.7 10*3/uL (ref 1.7–7.7)
Neutrophils Relative %: 66 %
Platelets: 244 10*3/uL (ref 150–400)
RBC: 3.37 MIL/uL — ABNORMAL LOW (ref 3.87–5.11)
RDW: 13.2 % (ref 11.5–15.5)
WBC: 8.6 10*3/uL (ref 4.0–10.5)

## 2017-03-16 LAB — COMPREHENSIVE METABOLIC PANEL
ALT: 22 U/L (ref 14–54)
AST: 21 U/L (ref 15–41)
Albumin: 2.8 g/dL — ABNORMAL LOW (ref 3.5–5.0)
Alkaline Phosphatase: 96 U/L (ref 38–126)
Anion gap: 8 (ref 5–15)
BILIRUBIN TOTAL: 0.2 mg/dL — AB (ref 0.3–1.2)
BUN: 5 mg/dL — AB (ref 6–20)
CO2: 22 mmol/L (ref 22–32)
CREATININE: 0.54 mg/dL (ref 0.44–1.00)
Calcium: 8.8 mg/dL — ABNORMAL LOW (ref 8.9–10.3)
Chloride: 105 mmol/L (ref 101–111)
GFR calc Af Amer: >60 mL/min (ref >60)
Glucose, Bld: 105 mg/dL — ABNORMAL HIGH (ref 65–99)
Potassium: 4 mmol/L (ref 3.5–5.1)
Sodium: 135 mmol/L (ref 135–145)
TOTAL PROTEIN: 6.4 g/dL — AB (ref 6.5–8.1)

## 2017-03-16 LAB — PROTIME-INR
INR: 0.97
PROTHROMBIN TIME: 12.8 s (ref 11.4–15.2)

## 2017-03-16 NOTE — ED Notes (Signed)
Pt verbalized understanding of d/c instructions and has no further questions. VSS, NAD. Pt educated to go to [facility] for any further OB needs. Pt removed all belongings.

## 2017-03-16 NOTE — Progress Notes (Addendum)
1205 Arrived to evaluate this 21 yo G2P1 @ 34.[redacted] wks GA in with report of upper abd pain with bending over.  Denies vaginal bleeding, reports occasional watery discharge, reports good fetal movement.  History of emergent cesarean delivery of 24 wk infant that was presenting footling breech in PTL scenario.  Low transverse skin incision with classical uterine incision.  Infant passed around 1 month of age. 1240  FHR Category I, occ UI noted. SVE closed/ 50/ ballotable, no obvious LOF. 1250 Dr. Debroah LoopArnold notified of pt in ED and of above.  He states pt can be OB cleared with instruction to follow [facility] as scheduled and to present to [facility] for future OB complaints. Labor and ROM precautions will be reviewed with pt.

## 2017-03-16 NOTE — ED Notes (Signed)
Pt cleared by OB, EDP notified.

## 2017-03-16 NOTE — ED Notes (Signed)
Rapid OB in room with pt.

## 2017-03-16 NOTE — Discharge Instructions (Addendum)
Follow your OB/GYN.  Recheck with GYN, or ER as needed for worsening symptoms

## 2017-03-16 NOTE — ED Triage Notes (Signed)
Per Pt, Pt is [redacted] weeks pregnant with prenatal care. Reports having upper abdominal pain and hasn't felt the baby move from last night. Denies any bleeding, but reports some discharge.

## 2017-03-16 NOTE — ED Notes (Signed)
OB Rapid Response has been contacted

## 2017-03-18 NOTE — ED Provider Notes (Signed)
[facility] Provider Note   CSN: [id number] Arrival date & time: 03/16/17  1138     History   Chief Complaint Chief Complaint  Patient presents with  . Abdominal Pain    HPI Melinda Richardson is a 21 y.o. female.CC: I haven't felt the baby move since last night"  HPI:  21 year old female. Currently [redacted] weeks pregnant. States she hadn't felt the baby move since last night. She states that she has noticed when she bends or leans over that her upper abdomen is "sore". No nausea. No pain with eating. No dysuria or urinary symptoms. No change in bowel or bladder habits. No difficult breathing or chest pain.  Past Medical History:  Diagnosis Date  . Anemia   . Medical history non-contributory   . Preterm labor     Patient Active Problem List   Diagnosis Date Noted  . Supervision of high risk pregnancy, antepartum 01/10/2017  . History of preterm delivery, currently pregnant 01/10/2017  . ASCUS with positive high risk HPV cervical 01/10/2017  . Maternal varicella, non-immune 01/10/2017  . S/P emergency C-section 12/17/2014  . Preterm premature rupture of membranes (PPROM) with unknown onset of labor 12/11/2014  . Cervical incompetence during pregnancy in second trimester 12/09/2014    Past Surgical History:  Procedure Laterality Date  . CESAREAN SECTION N/A 12/17/2014   Procedure: CESAREAN SECTION;  Surgeon: Tilda BurrowJohn Ferguson V, MD;  Location: [facility];  Service: Obstetrics;  Laterality: N/A;  . CESAREAN SECTION  12/17/14   PTL @ 24 wks - footling breech  . CESAREAN SECTION CLASSICAL  12/17/2014   at 6112w2d  . TONSILLECTOMY      OB History    Gravida Para Term Preterm AB Living   2 1 0 1 0 0   SAB TAB Ectopic Multiple Live Births   0 0 0   1       Home Medications    Prior to Admission medications   Medication Sig Start Date End Date Taking? Authorizing Provider  acetaminophen (TYLENOL) 325 MG tablet Take 650 mg by mouth every 6 (six)  hours as needed for mild pain.   Yes [provider]  Prenatal Multivit-Min-Fe-FA (PRENATAL VITAMINS PO) Take 1 tablet by mouth daily.   Yes [provider]  progesterone (PROMETRIUM) 200 MG capsule Place one capsule vaginally at bedtime 01/10/17  Yes Conan Bowensavis, Kelly M, MD    Family History Family History  Problem Relation Age of Onset  . Kidney failure Father   . Diabetes Mother     Social History Social History   Tobacco Use  . Smoking status: Never Smoker  . Smokeless tobacco: Never Used  Substance Use Topics  . Alcohol use: No  . Drug use: No     Allergies   Patient has no known allergies.   Review of Systems Review of Systems  Constitutional: Negative for appetite change, chills, diaphoresis, fatigue and fever.  HENT: Negative for mouth sores, sore throat and trouble swallowing.   Eyes: Negative for visual disturbance.  Respiratory: Negative for cough, chest tightness, shortness of breath and wheezing.   Cardiovascular: Negative for chest pain.  Gastrointestinal: Positive for abdominal pain. Negative for abdominal distention, diarrhea, nausea and vomiting.  Endocrine: Negative for polydipsia, polyphagia and polyuria.  Genitourinary: Negative for dysuria, frequency and hematuria.  Musculoskeletal: Negative for gait problem.  Skin: Negative for color change, pallor and rash.  Neurological: Negative for dizziness, syncope, light-headedness and headaches.  Hematological: Does  not bruise/bleed easily.  Psychiatric/Behavioral: Negative for behavioral problems and confusion.     Physical Exam Updated Vital Signs BP 106/79   Pulse 87   Temp 98.4 F (36.9 C) (Oral)   Resp 16   Ht 5\' 5"  (1.651 m)   Wt 124.3 kg (274 lb)   LMP 07/19/2016 (Approximate)   SpO2 100%   BMI 45.60 kg/m   Physical Exam  Constitutional: She is oriented to person, place, and time. She appears well-developed and well-nourished. No distress.  HENT:  Head: Normocephalic.    Eyes: Conjunctivae are normal. Pupils are equal, round, and reactive to light. No scleral icterus.  Neck: Normal range of motion. Neck supple. No thyromegaly present.  Cardiovascular: Normal rate and regular rhythm. Exam reveals no gallop and no friction rub.  No murmur heard. Pulmonary/Chest: Effort normal and breath sounds normal. No respiratory distress. She has no wheezes. She has no rales.  Abdominal: Soft. Bowel sounds are normal. She exhibits no distension. There is no tenderness. There is no rebound.  Points to area of the uterine fundus just subxiphoid. Somewhat tender to the abdominal musculature. Otherwise no areas of tenderness or concern.  Musculoskeletal: Normal range of motion.  Neurological: She is alert and oriented to person, place, and time.  Skin: Skin is warm and dry. No rash noted.  Psychiatric: She has a normal mood and affect. Her behavior is normal.     ED Treatments / Results  Labs (all labs ordered are listed, but only abnormal results are displayed) Labs Reviewed  CBC WITH DIFFERENTIAL/PLATELET - Abnormal; Notable for the following components:      Result Value   RBC 3.37 (*)    Hemoglobin 9.3 (*)    HCT 28.8 (*)    All other components within normal limits  COMPREHENSIVE METABOLIC PANEL - Abnormal; Notable for the following components:   Glucose, Bld 105 (*)    BUN 5 (*)    Calcium 8.8 (*)    Total Protein 6.4 (*)    Albumin 2.8 (*)    Total Bilirubin 0.2 (*)    All other components within normal limits  PROTIME-INR    EKG  EKG Interpretation None       Radiology No results found.  Procedures Procedures (including critical care time)  Medications Ordered in ED Medications - No data to display   Initial Impression / Assessment and Plan / ED Course  I have reviewed the triage vital signs and the nursing notes.  Pertinent labs & imaging results that were available during my care of the patient were reviewed by me and considered in my  medical decision making (see chart for details).    No GI or biliary complaints. Normal testing. Patient placed on monitoring. Reassuring fetal heart tones. No contractions. Discussed with OB attending by rapid response nurse. Appropriate for discharge. Given return precautions.  Final Clinical Impressions(s) / ED Diagnoses   Final diagnoses:  Generalized abdominal pain  [redacted] weeks gestation of pregnancy    ED Discharge Orders    None       Rolland PorterJames, Teeghan Hammer, MD 03/18/17 [phone]

## 2017-03-19 ENCOUNTER — Encounter: Payer: Medicaid Other | Admitting: Obstetrics & Gynecology

## 2017-03-21 ENCOUNTER — Ambulatory Visit (INDEPENDENT_AMBULATORY_CARE_PROVIDER_SITE_OTHER): Payer: Medicaid Other | Admitting: Obstetrics and Gynecology

## 2017-03-21 ENCOUNTER — Encounter: Payer: Self-pay | Admitting: Obstetrics and Gynecology

## 2017-03-21 ENCOUNTER — Encounter (HOSPITAL_COMMUNITY): Payer: Self-pay

## 2017-03-21 VITALS — BP 131/68 | HR 105 | Wt 271.9 lb

## 2017-03-21 DIAGNOSIS — Z283 Underimmunization status: Secondary | ICD-10-CM

## 2017-03-21 DIAGNOSIS — R87610 Atypical squamous cells of undetermined significance on cytologic smear of cervix (ASC-US): Secondary | ICD-10-CM

## 2017-03-21 DIAGNOSIS — O09219 Supervision of pregnancy with history of pre-term labor, unspecified trimester: Secondary | ICD-10-CM

## 2017-03-21 DIAGNOSIS — Z2839 Other underimmunization status: Secondary | ICD-10-CM

## 2017-03-21 DIAGNOSIS — O0990 Supervision of high risk pregnancy, unspecified, unspecified trimester: Secondary | ICD-10-CM

## 2017-03-21 DIAGNOSIS — Z98891 History of uterine scar from previous surgery: Secondary | ICD-10-CM

## 2017-03-21 DIAGNOSIS — R87810 Cervical high risk human papillomavirus (HPV) DNA test positive: Secondary | ICD-10-CM

## 2017-03-21 DIAGNOSIS — O09899 Supervision of other high risk pregnancies, unspecified trimester: Secondary | ICD-10-CM

## 2017-03-21 NOTE — Progress Notes (Signed)
   PRENATAL VISIT NOTE  Subjective:  Melinda Richardson is a 21 y.o. G2P0100 at [redacted]w[redacted]d being seen today for ongoing prenatal care.  She is currently monitored for the following issues for this high-risk pregnancy and has Cervical incompetence during pregnancy in second trimester; Preterm premature rupture of membranes (PPROM) with unknown onset of labor; S/P emergency C-section; Supervision of high risk pregnancy, antepartum; History of preterm delivery, currently pregnant; ASCUS with positive high risk HPV cervical; and Maternal varicella, non-immune on their problem list.  Patient reports no complaints.  Contractions: Irritability. Vag. Bleeding: None.  Movement: Present. Denies leaking of fluid.   The following portions of the patient's history were reviewed and updated as appropriate: allergies, current medications, past family history, past medical history, past social history, past surgical history and problem list. Problem list updated.  Objective:   Vitals:   03/21/17 1042  BP: 131/68  Pulse: (!) 105  Weight: 271 lb 14.4 oz (123.3 kg)    Fetal Status: Fetal Heart Rate (bpm): 150 Fundal Height: 35 cm Movement: Present     General:  Alert, oriented and cooperative. Patient is in no acute distress.  Skin: Skin is warm and dry. No rash noted.   Cardiovascular: Normal heart rate noted  Respiratory: Normal respiratory effort, no problems with respiration noted  Abdomen: Soft, gravid, appropriate for gestational age.  Pain/Pressure: Present     Pelvic: Cervical exam deferred        Extremities: Normal range of motion.  Edema: None  Mental Status:  Normal mood and affect. Normal behavior. Normal judgment and thought content.   Assessment and Plan:  Pregnancy: G2P0100 at [redacted]w[redacted]d  1. Supervision of high risk pregnancy, antepartum Patient is doing well without complaints  Cultures next visit 2. S/P emergency C-section Patient scheduled for repeat at 37 weeks due to previous  classical  3. Maternal varicella, non-immune Will offer pp  4. ASCUS with positive high risk HPV cervical Colpo pp  5. History of preterm delivery, currently pregnant Continue prometrium  Preterm labor symptoms and general obstetric precautions including but not limited to vaginal bleeding, contractions, leaking of fluid and fetal movement were reviewed in detail with the patient. Please refer to After Visit Summary for other counseling recommendations.  Return in about 1 week (around 03/28/2017) for ROB.   Catalina AntiguaPeggy Raynell Scott, MD

## 2017-03-29 ENCOUNTER — Ambulatory Visit (INDEPENDENT_AMBULATORY_CARE_PROVIDER_SITE_OTHER): Payer: Medicaid Other | Admitting: Obstetrics and Gynecology

## 2017-03-29 ENCOUNTER — Encounter: Payer: Self-pay | Admitting: Obstetrics and Gynecology

## 2017-03-29 ENCOUNTER — Other Ambulatory Visit (HOSPITAL_COMMUNITY)
Admission: RE | Admit: 2017-03-29 | Discharge: 2017-03-29 | Disposition: A | Discharge status: Home / Self Care | Payer: Medicaid Other | Source: Ambulatory Visit | Attending: Obstetrics and Gynecology | Admitting: Obstetrics and Gynecology

## 2017-03-29 VITALS — BP 126/69 | HR 99 | Wt 275.8 lb

## 2017-03-29 DIAGNOSIS — O0993 Supervision of high risk pregnancy, unspecified, third trimester: Secondary | ICD-10-CM | POA: Insufficient documentation

## 2017-03-29 DIAGNOSIS — O09899 Supervision of other high risk pregnancies, unspecified trimester: Secondary | ICD-10-CM

## 2017-03-29 DIAGNOSIS — Z98891 History of uterine scar from previous surgery: Secondary | ICD-10-CM

## 2017-03-29 DIAGNOSIS — O09219 Supervision of pregnancy with history of pre-term labor, unspecified trimester: Secondary | ICD-10-CM

## 2017-03-29 DIAGNOSIS — O09213 Supervision of pregnancy with history of pre-term labor, third trimester: Secondary | ICD-10-CM

## 2017-03-29 DIAGNOSIS — Z3A36 36 weeks gestation of pregnancy: Secondary | ICD-10-CM | POA: Diagnosis not present

## 2017-03-29 DIAGNOSIS — O0990 Supervision of high risk pregnancy, unspecified, unspecified trimester: Secondary | ICD-10-CM

## 2017-03-29 NOTE — Progress Notes (Signed)
   PRENATAL VISIT NOTE  Subjective:  Melinda Richardson is a 21 y.o. G2P0100 at 7312w1d being seen today for ongoing prenatal care.  She is currently monitored for the following issues for this high-risk pregnancy and has Cervical incompetence during pregnancy in second trimester; Preterm premature rupture of membranes (PPROM) with unknown onset of labor; S/P emergency C-section; Supervision of high risk pregnancy, antepartum; History of preterm delivery, currently pregnant; ASCUS with positive high risk HPV cervical; and Maternal varicella, non-immune on their problem list.  Patient reports no complaints.  Contractions: Irregular. Vag. Bleeding: None.  Movement: Present. Denies leaking of fluid.   The following portions of the patient's history were reviewed and updated as appropriate: allergies, current medications, past family history, past medical history, past social history, past surgical history and problem list. Problem list updated.  Objective:   Vitals:   03/29/17 1359  BP: 126/69  Pulse: 99  Weight: 275 lb 12.8 oz (125.1 kg)    Fetal Status: Fetal Heart Rate (bpm): 164 Fundal Height: 36 cm Movement: Present     General:  Alert, oriented and cooperative. Patient is in no acute distress.  Skin: Skin is warm and dry. No rash noted.   Cardiovascular: Normal heart rate noted  Respiratory: Normal respiratory effort, no problems with respiration noted  Abdomen: Soft, gravid, appropriate for gestational age.  Pain/Pressure: Absent     Pelvic: Cervical exam deferred        Extremities: Normal range of motion.  Edema: None  Mental Status:  Normal mood and affect. Normal behavior. Normal judgment and thought content.   Assessment and Plan:  Pregnancy: G2P0100 at 4312w1d  1. Supervision of high risk pregnancy, antepartum Patient is doing well. Ready for delivery on 12/27 Cultures today - Culture, beta strep (group b only) - GC/Chlamydia probe amp ([facility]  2. S/P  emergency C-section Scheduled for repeat at 37 weeks due to previous classical  3. History of preterm delivery, currently pregnant Continue prometrium until delivery date  Preterm labor symptoms and general obstetric precautions including but not limited to vaginal bleeding, contractions, leaking of fluid and fetal movement were reviewed in detail with the patient. Please refer to After Visit Summary for other counseling recommendations.  Return in about 3 weeks (around 04/19/2017) for pp visit incision check (RN visit).   Catalina AntiguaPeggy Laycee Fitzsimmons, MD

## 2017-03-30 LAB — GC/CHLAMYDIA PROBE AMP (~~LOC~~) NOT AT ARMC
Chlamydia: NEGATIVE
Neisseria Gonorrhea: NEGATIVE

## 2017-04-01 ENCOUNTER — Encounter: Payer: Self-pay | Admitting: Obstetrics and Gynecology

## 2017-04-01 DIAGNOSIS — O99820 Streptococcus B carrier state complicating pregnancy: Secondary | ICD-10-CM | POA: Insufficient documentation

## 2017-04-01 LAB — CULTURE, BETA STREP (GROUP B ONLY): Strep Gp B Culture: POSITIVE — AB

## 2017-04-04 ENCOUNTER — Encounter (HOSPITAL_COMMUNITY)
Admission: RE | Admit: 2017-04-04 | Discharge: 2017-04-04 | Disposition: A | Discharge status: Home / Self Care | Payer: Medicaid Other | Source: Ambulatory Visit | Attending: Family Medicine | Admitting: Family Medicine

## 2017-04-04 LAB — CBC
HCT: 30.6 % — ABNORMAL LOW (ref 36.0–46.0)
Hemoglobin: 10 g/dL — ABNORMAL LOW (ref 12.0–15.0)
MCH: 27.9 pg (ref 26.0–34.0)
MCHC: 32.7 g/dL (ref 30.0–36.0)
MCV: 85.5 fL (ref 78.0–100.0)
PLATELETS: 244 10*3/uL (ref 150–400)
RBC: 3.58 MIL/uL — ABNORMAL LOW (ref 3.87–5.11)
RDW: 13.5 % (ref 11.5–15.5)
WBC: 7.6 10*3/uL (ref 4.0–10.5)

## 2017-04-04 LAB — TYPE AND SCREEN
ABO/RH(D): O POS
ANTIBODY SCREEN: NEGATIVE

## 2017-04-04 NOTE — Patient Instructions (Signed)
Melinda GentileCamary N Richardson  04/04/2017   Your procedure is scheduled on:  04/05/2017  [facility] at 1000 AM.  Pick up the phone at the desk and dial [phone]  Call this number if you have problems the morning of surgery:[phone]  Remember:   Do not eat food:After Midnight.  Do not drink clear liquids: After Midnight.  Take these medicines the morning of surgery with A SIP OF WATER: none   Do not wear jewelry, make-up or nail polish.  Do not wear lotions, powders, or perfumes. Do not wear deodorant.  Do not shave 48 hours prior to surgery.  Do not bring valuables to the hospital.  [facility] is not   responsible for any belongings or valuables brought to the hospital.  Contacts, dentures or bridgework may not be worn into surgery.  Leave suitcase in the car. After surgery it may be brought to your room.  For patients admitted to the hospital, checkout time is 11:00 AM the day of              discharge.    N/A   Please read over the following fact sheets that you were given:   Surgical Site Infection Prevention

## 2017-04-05 ENCOUNTER — Other Ambulatory Visit: Payer: Self-pay

## 2017-04-05 ENCOUNTER — Encounter (HOSPITAL_COMMUNITY)
Admission: AD | Disposition: A | Discharge status: Home / Self Care | Payer: Self-pay | Source: Ambulatory Visit | Attending: Family Medicine

## 2017-04-05 ENCOUNTER — Encounter (HOSPITAL_COMMUNITY): Payer: Self-pay | Admitting: *Deleted

## 2017-04-05 ENCOUNTER — Inpatient Hospital Stay (HOSPITAL_COMMUNITY)
Admission: AD | Admit: 2017-04-05 | Discharge: 2017-04-06 | DRG: 788 | Disposition: A | Discharge status: Home / Self Care | Payer: Medicaid Other | Source: Ambulatory Visit | Attending: Family Medicine | Admitting: Family Medicine

## 2017-04-05 ENCOUNTER — Inpatient Hospital Stay (HOSPITAL_COMMUNITY): Payer: Medicaid Other | Admitting: Certified Registered Nurse Anesthetist

## 2017-04-05 DIAGNOSIS — O99820 Streptococcus B carrier state complicating pregnancy: Secondary | ICD-10-CM

## 2017-04-05 DIAGNOSIS — O34212 Maternal care for vertical scar from previous cesarean delivery: Secondary | ICD-10-CM | POA: Diagnosis not present

## 2017-04-05 DIAGNOSIS — Z3A37 37 weeks gestation of pregnancy: Secondary | ICD-10-CM

## 2017-04-05 DIAGNOSIS — R87610 Atypical squamous cells of undetermined significance on cytologic smear of cervix (ASC-US): Secondary | ICD-10-CM

## 2017-04-05 DIAGNOSIS — O99214 Obesity complicating childbirth: Secondary | ICD-10-CM | POA: Diagnosis not present

## 2017-04-05 DIAGNOSIS — Z98891 History of uterine scar from previous surgery: Secondary | ICD-10-CM

## 2017-04-05 DIAGNOSIS — O9902 Anemia complicating childbirth: Secondary | ICD-10-CM | POA: Diagnosis present

## 2017-04-05 DIAGNOSIS — R87810 Cervical high risk human papillomavirus (HPV) DNA test positive: Secondary | ICD-10-CM

## 2017-04-05 DIAGNOSIS — D649 Anemia, unspecified: Secondary | ICD-10-CM | POA: Diagnosis not present

## 2017-04-05 DIAGNOSIS — O09219 Supervision of pregnancy with history of pre-term labor, unspecified trimester: Secondary | ICD-10-CM

## 2017-04-05 DIAGNOSIS — Z2839 Other underimmunization status: Secondary | ICD-10-CM

## 2017-04-05 DIAGNOSIS — O09899 Supervision of other high risk pregnancies, unspecified trimester: Secondary | ICD-10-CM

## 2017-04-05 DIAGNOSIS — Z283 Underimmunization status: Secondary | ICD-10-CM

## 2017-04-05 DIAGNOSIS — O99824 Streptococcus B carrier state complicating childbirth: Secondary | ICD-10-CM | POA: Diagnosis present

## 2017-04-05 DIAGNOSIS — O0990 Supervision of high risk pregnancy, unspecified, unspecified trimester: Secondary | ICD-10-CM

## 2017-04-05 LAB — RPR: RPR: NONREACTIVE

## 2017-04-05 LAB — CREATININE, SERUM
CREATININE: 0.53 mg/dL (ref 0.44–1.00)
GFR calc Af Amer: >60 mL/min (ref >60)

## 2017-04-05 LAB — CBC
HCT: 28.2 % — ABNORMAL LOW (ref 36.0–46.0)
HEMOGLOBIN: 9.4 g/dL — AB (ref 12.0–15.0)
MCH: 28.1 pg (ref 26.0–34.0)
MCHC: 33.3 g/dL (ref 30.0–36.0)
MCV: 84.4 fL (ref 78.0–100.0)
PLATELETS: 209 10*3/uL (ref 150–400)
RBC: 3.34 MIL/uL — AB (ref 3.87–5.11)
RDW: 13.6 % (ref 11.5–15.5)
WBC: 9.5 10*3/uL (ref 4.0–10.5)

## 2017-04-05 SURGERY — Surgical Case
Anesthesia: Spinal

## 2017-04-05 MED ORDER — OXYTOCIN 10 UNIT/ML IJ SOLN
INTRAMUSCULAR | Status: AC
  Filled 2017-04-05: qty 4

## 2017-04-05 MED ORDER — PRENATAL MULTIVITAMIN CH
1.0000 | ORAL_TABLET | Freq: Every day | ORAL | Status: DC
  Administered 2017-04-06 – 2017-04-07 (×2): 1 via ORAL
  Filled 2017-04-05 (×2): qty 1

## 2017-04-05 MED ORDER — SIMETHICONE 80 MG PO CHEW
80.0000 mg | CHEWABLE_TABLET | Freq: Three times a day (TID) | ORAL | Status: DC
  Administered 2017-04-05 – 2017-04-07 (×3): 80 mg via ORAL
  Filled 2017-04-05 (×4): qty 1

## 2017-04-05 MED ORDER — PHENYLEPHRINE HCL 10 MG/ML IJ SOLN
INTRAMUSCULAR | Status: DC | PRN
  Administered 2017-04-05: 100 ug via INTRAVENOUS

## 2017-04-05 MED ORDER — PHENYLEPHRINE 8 MG IN D5W 100 ML (0.08MG/ML) PREMIX OPTIME
INJECTION | INTRAVENOUS | Status: AC
  Filled 2017-04-05: qty 100

## 2017-04-05 MED ORDER — DIBUCAINE 1 % RE OINT: 1.0000 "application " | TOPICAL_OINTMENT | RECTAL | Status: DC | PRN

## 2017-04-05 MED ORDER — SENNOSIDES-DOCUSATE SODIUM 8.6-50 MG PO TABS
2.0000 | ORAL_TABLET | ORAL | Status: DC
  Administered 2017-04-06 (×2): 2 via ORAL
  Filled 2017-04-05 (×2): qty 2

## 2017-04-05 MED ORDER — FENTANYL CITRATE (PF) 100 MCG/2ML IJ SOLN
25.0000 ug | INTRAMUSCULAR | Status: DC | PRN
  Administered 2017-04-05: 25 ug via INTRAVENOUS
  Administered 2017-04-05: 50 ug via INTRAVENOUS
  Administered 2017-04-05: 25 ug via INTRAVENOUS

## 2017-04-05 MED ORDER — MORPHINE SULFATE (PF) 0.5 MG/ML IJ SOLN
INTRAMUSCULAR | Status: DC | PRN
  Administered 2017-04-05: .1 mg via INTRATHECAL

## 2017-04-05 MED ORDER — MORPHINE SULFATE (PF) 0.5 MG/ML IJ SOLN
INTRAMUSCULAR | Status: AC
  Filled 2017-04-05: qty 10

## 2017-04-05 MED ORDER — WITCH HAZEL-GLYCERIN EX PADS: 1.0000 "application " | MEDICATED_PAD | CUTANEOUS | Status: DC | PRN

## 2017-04-05 MED ORDER — DEXTROSE 5 % IV SOLN
3.0000 g | INTRAVENOUS | Status: AC
  Administered 2017-04-05: 3 g via INTRAVENOUS
  Filled 2017-04-05: qty 3000

## 2017-04-05 MED ORDER — FENTANYL CITRATE (PF) 100 MCG/2ML IJ SOLN
INTRAMUSCULAR | Status: AC
  Filled 2017-04-05: qty 2

## 2017-04-05 MED ORDER — ZOLPIDEM TARTRATE 5 MG PO TABS: 5.0000 mg | ORAL_TABLET | Freq: Every evening | ORAL | Status: DC | PRN

## 2017-04-05 MED ORDER — SIMETHICONE 80 MG PO CHEW
80.0000 mg | CHEWABLE_TABLET | ORAL | Status: DC
  Administered 2017-04-06 (×2): 80 mg via ORAL
  Filled 2017-04-05 (×2): qty 1

## 2017-04-05 MED ORDER — MENTHOL 3 MG MT LOZG: 1.0000 | LOZENGE | OROMUCOSAL | Status: DC | PRN

## 2017-04-05 MED ORDER — IBUPROFEN 600 MG PO TABS
600.0000 mg | ORAL_TABLET | Freq: Four times a day (QID) | ORAL | Status: DC
  Administered 2017-04-05 – 2017-04-07 (×8): 600 mg via ORAL
  Filled 2017-04-05 (×8): qty 1

## 2017-04-05 MED ORDER — ERYTHROMYCIN 5 MG/GM OP OINT
TOPICAL_OINTMENT | OPHTHALMIC | Status: AC
  Filled 2017-04-05: qty 1

## 2017-04-05 MED ORDER — ENOXAPARIN SODIUM 60 MG/0.6ML ~~LOC~~ SOLN
60.0000 mg | SUBCUTANEOUS | Status: DC
  Administered 2017-04-06 – 2017-04-07 (×2): 60 mg via SUBCUTANEOUS
  Filled 2017-04-05 (×2): qty 0.6

## 2017-04-05 MED ORDER — ENOXAPARIN SODIUM 40 MG/0.4ML ~~LOC~~ SOLN
40.0000 mg | SUBCUTANEOUS | Status: DC
  Filled 2017-04-05: qty 0.4

## 2017-04-05 MED ORDER — VITAMIN K1 1 MG/0.5ML IJ SOLN
INTRAMUSCULAR | Status: AC
  Filled 2017-04-05: qty 0.5

## 2017-04-05 MED ORDER — OXYTOCIN 40 UNITS IN LACTATED RINGERS INFUSION - SIMPLE MED: 2.5000 [IU]/h | INTRAVENOUS | Status: AC

## 2017-04-05 MED ORDER — DIPHENHYDRAMINE HCL 25 MG PO CAPS: 25.0000 mg | ORAL_CAPSULE | Freq: Four times a day (QID) | ORAL | Status: DC | PRN

## 2017-04-05 MED ORDER — ACETAMINOPHEN 325 MG PO TABS
650.0000 mg | ORAL_TABLET | ORAL | Status: DC | PRN
  Administered 2017-04-05: 650 mg via ORAL
  Filled 2017-04-05: qty 2

## 2017-04-05 MED ORDER — TETANUS-DIPHTH-ACELL PERTUSSIS 5-2.5-18.5 LF-MCG/0.5 IM SUSP: 0.5000 mL | Freq: Once | INTRAMUSCULAR | Status: DC

## 2017-04-05 MED ORDER — LACTATED RINGERS IV SOLN
INTRAVENOUS | Status: DC
  Administered 2017-04-05: 22:00:00 via INTRAVENOUS

## 2017-04-05 MED ORDER — ONDANSETRON HCL 4 MG/2ML IJ SOLN
INTRAMUSCULAR | Status: DC | PRN
  Administered 2017-04-05: 4 mg via INTRAVENOUS

## 2017-04-05 MED ORDER — PHENYLEPHRINE 8 MG IN D5W 100 ML (0.08MG/ML) PREMIX OPTIME
INJECTION | INTRAVENOUS | Status: DC | PRN
  Administered 2017-04-05: 75 ug/min via INTRAVENOUS

## 2017-04-05 MED ORDER — LACTATED RINGERS IV SOLN
INTRAVENOUS | Status: DC | PRN
  Administered 2017-04-05: 12:00:00 via INTRAVENOUS

## 2017-04-05 MED ORDER — ONDANSETRON HCL 4 MG/2ML IJ SOLN
INTRAMUSCULAR | Status: AC
  Filled 2017-04-05: qty 2

## 2017-04-05 MED ORDER — ACETAMINOPHEN 160 MG/5ML PO SOLN
1000.0000 mg | Freq: Once | ORAL | Status: AC
  Administered 2017-04-05: 1000 mg via ORAL
  Filled 2017-04-05: qty 40.6

## 2017-04-05 MED ORDER — COCONUT OIL OIL: 1.0000 "application " | TOPICAL_OIL | Status: DC | PRN

## 2017-04-05 MED ORDER — OXYTOCIN 10 UNIT/ML IJ SOLN
INTRAVENOUS | Status: DC | PRN
  Administered 2017-04-05: 40 [IU] via INTRAVENOUS

## 2017-04-05 MED ORDER — OXYCODONE-ACETAMINOPHEN 5-325 MG PO TABS
2.0000 | ORAL_TABLET | ORAL | Status: DC | PRN
  Administered 2017-04-05 – 2017-04-07 (×5): 2 via ORAL
  Filled 2017-04-05 (×5): qty 2

## 2017-04-05 MED ORDER — FENTANYL CITRATE (PF) 100 MCG/2ML IJ SOLN
INTRAMUSCULAR | Status: DC | PRN
  Administered 2017-04-05: 25 ug via INTRATHECAL

## 2017-04-05 MED ORDER — LACTATED RINGERS IV SOLN
125.0000 mL/h | INTRAVENOUS | Status: DC
  Administered 2017-04-05: 125 mL/h via INTRAVENOUS
  Administered 2017-04-05 (×3): via INTRAVENOUS

## 2017-04-05 MED ORDER — SODIUM CHLORIDE 0.9 % IR SOLN
Status: DC | PRN
  Administered 2017-04-05: 1

## 2017-04-05 MED ORDER — SIMETHICONE 80 MG PO CHEW
80.0000 mg | CHEWABLE_TABLET | ORAL | Status: DC | PRN
  Administered 2017-04-06: 80 mg via ORAL

## 2017-04-05 MED ORDER — PHENYLEPHRINE 40 MCG/ML (10ML) SYRINGE FOR IV PUSH (FOR BLOOD PRESSURE SUPPORT)
PREFILLED_SYRINGE | INTRAVENOUS | Status: AC
  Filled 2017-04-05: qty 10

## 2017-04-05 MED ORDER — CEFAZOLIN SODIUM-DEXTROSE 2-4 GM/100ML-% IV SOLN: 2.0000 g | Freq: Once | INTRAVENOUS | Status: DC

## 2017-04-05 SURGICAL SUPPLY — 35 items
BENZOIN TINCTURE PRP APPL 2/3 (GAUZE/BANDAGES/DRESSINGS) ×3 IMPLANT
CHLORAPREP W/TINT 26ML (MISCELLANEOUS) ×3 IMPLANT
CLAMP CORD UMBIL (MISCELLANEOUS) IMPLANT
CLOSURE WOUND 1/2 X4 (GAUZE/BANDAGES/DRESSINGS) ×1
CLOTH BEACON ORANGE TIMEOUT ST (SAFETY) ×3 IMPLANT
DRSG OPSITE POSTOP 4X10 (GAUZE/BANDAGES/DRESSINGS) ×3 IMPLANT
ELECT REM PT RETURN 9FT ADLT (ELECTROSURGICAL) ×3
ELECTRODE REM PT RTRN 9FT ADLT (ELECTROSURGICAL) ×1 IMPLANT
EXTRACTOR VACUUM M CUP 4 TUBE (SUCTIONS) ×2 IMPLANT
EXTRACTOR VACUUM M CUP 4' TUBE (SUCTIONS) ×1
GLOVE BIOGEL PI IND STRL 7.0 (GLOVE) ×4 IMPLANT
GLOVE BIOGEL PI IND STRL 7.5 (GLOVE) ×2 IMPLANT
GLOVE BIOGEL PI INDICATOR 7.0 (GLOVE) ×8
GLOVE BIOGEL PI INDICATOR 7.5 (GLOVE) ×4
GLOVE ECLIPSE 7.5 STRL STRAW (GLOVE) ×3 IMPLANT
GOWN STRL REUS W/TWL LRG LVL3 (GOWN DISPOSABLE) ×9 IMPLANT
HOVERMATT SINGLE USE (MISCELLANEOUS) ×3 IMPLANT
KIT ABG SYR 3ML LUER SLIP (SYRINGE) IMPLANT
NEEDLE HYPO 25X5/8 SAFETYGLIDE (NEEDLE) IMPLANT
NS IRRIG 1000ML POUR BTL (IV SOLUTION) ×3 IMPLANT
PACK C SECTION WH (CUSTOM PROCEDURE TRAY) ×3 IMPLANT
PAD OB MATERNITY 4.3X12.25 (PERSONAL CARE ITEMS) ×3 IMPLANT
PENCIL SMOKE EVAC W/HOLSTER (ELECTROSURGICAL) ×3 IMPLANT
RETRACTOR TRAXI PANNICULUS (MISCELLANEOUS) ×1 IMPLANT
RTRCTR C-SECT PINK 25CM LRG (MISCELLANEOUS) ×3 IMPLANT
STRIP CLOSURE SKIN 1/2X4 (GAUZE/BANDAGES/DRESSINGS) ×2 IMPLANT
SUT PLAIN 2 0 XLH (SUTURE) ×3 IMPLANT
SUT VIC AB 0 CTX 36 (SUTURE) ×6
SUT VIC AB 0 CTX36XBRD ANBCTRL (SUTURE) ×3 IMPLANT
SUT VIC AB 2-0 CT1 27 (SUTURE) ×2
SUT VIC AB 2-0 CT1 TAPERPNT 27 (SUTURE) ×1 IMPLANT
SUT VIC AB 4-0 KS 27 (SUTURE) ×3 IMPLANT
TOWEL OR 17X24 6PK STRL BLUE (TOWEL DISPOSABLE) ×3 IMPLANT
TRAXI PANNICULUS RETRACTOR (MISCELLANEOUS) ×2
TRAY FOLEY BAG SILVER LF 14FR (SET/KITS/TRAYS/PACK) ×3 IMPLANT

## 2017-04-05 NOTE — Transfer of Care (Signed)
Immediate Anesthesia Transfer of Care Note  Patient: Melinda Richardson  Procedure(s) Performed: CESAREAN SECTION (N/A )  Patient Location: PACU  Anesthesia Type:Spinal  Level of Consciousness: awake, alert , oriented and patient cooperative  Airway & Oxygen Therapy: Patient Spontanous Breathing  Post-op Assessment: Report given to RN and Post -op Vital signs reviewed and stable  Post vital signs: Reviewed and stable  Last Vitals:  Vitals:   04/05/17 1039 04/05/17 1042  BP:  125/76  Pulse:  90  Resp:  20  Temp: 36.7 C 36.7 C    Last Pain:  Vitals:   04/05/17 1042  TempSrc: Oral  PainSc:          Complications: No apparent anesthesia complications

## 2017-04-05 NOTE — Op Note (Signed)
Melinda Richardson PROCEDURE DATE: 04/05/2017  PREOPERATIVE DIAGNOSES: Intrauterine pregnancy at 4552w1d weeks gestation; previous uterine incision classical  POSTOPERATIVE DIAGNOSES: The same  PROCEDURE: Repeat Low Transverse Cesarean Section  SURGEON:   Dr. Adrian BlackwaterStinson- primary Dr. Rachelle HoraMoss- fellow    ANESTHESIOLOGY TEAM: Anesthesiologist: Cristela BlueJackson, Kyle, MD CRNA: Yolonda Kidaarver, Alison L, CRNA; Earmon PhoenixWilkerson, Valerie P, CRNA  INDICATIONS: Melinda Richardson is a 21 y.o. G2P1101 at 7152w1d here for cesarean section secondary to the indications listed under preoperative diagnoses; please see preoperative note for further details.  The risks of cesarean section were discussed with the patient including but were not limited to: bleeding which may require transfusion or reoperation; infection which may require antibiotics; injury to bowel, bladder, ureters or other surrounding organs; injury to the fetus; need for additional procedures including hysterectomy in the event of a life-threatening hemorrhage; placental abnormalities wth subsequent pregnancies, incisional problems, thromboembolic phenomenon and other postoperative/anesthesia complications.   The patient concurred with the proposed plan, giving informed written consent for the procedure.    FINDINGS:  Viable female infant in cephalic presentation.  Apgars 10 and 10.  Clear amniotic fluid.  Intact placenta, three vessel cord.  Normal uterus, fallopian tubes and ovaries bilaterally.  ANESTHESIA: Spinal ESTIMATED BLOOD LOSS: 707 ml URINE OUTPUT:  100 ml SPECIMENS: Placenta to L&D COMPLICATIONS: None immediate  PROCEDURE IN DETAIL:  The patient preoperatively received intravenous antibiotics and had sequential compression devices applied to her lower extremities.  She was then taken to the operating room where spinal anesthesia was administered and was found to be adequate. She was then placed in a dorsal supine position with a leftward tilt, and prepped and  draped in a sterile manner.  A foley catheter was placed into her bladder and attached to constant gravity.  After an adequate timeout was performed, a Pfannenstiel skin incision was made with scalpel over her preexisting scar and carried through to the underlying layer of fascia. The fascia was incised in the midline, and this incision was extended bilaterally using the Mayo scissors.  Kocher clamps were applied to the superior aspect of the fascial incision and the underlying rectus muscles were dissected off bluntly.  A similar process was carried out on the inferior aspect of the fascial incision. The rectus muscles were separated in the midline bluntly and the peritoneum was entered bluntly.Rectus muscles were incised bilaterally with coagulation. Attention was turned to the lower uterine segment where a low transverse hysterotomy was made with a scalpel and extended bilaterally bluntly.  The infant was successfully delivered with vacuum suction, the cord was clamped and cut after one minute, and the infant was handed over to the awaiting neonatology team. Uterine massage was then administered, and the placenta delivered intact with a three-vessel cord. The uterus was then cleared of clots and debris.  The hysterotomy was closed with 0 Vicryl in a running locked fashion, and an imbricating layer was also placed with 0 Vicryl.  Figure-of-eight 0 Vicryl serosal stitches were placed to help with hemostasis.  The pelvis was cleared of all clot and debris. Hemostasis was confirmed on all surfaces.  The peritoneum was closed with a 0 Vicryl running stitch. The fascia was then closed using 0 Vicryl. The subcutaneous layer was irrigated, then reapproximated with 2-0 plain gut interrupted stitches.The skin was closed with a 4-0 Vicryl subcuticular stitch. The patient tolerated the procedure well. Sponge, lap, instrument and needle counts were correct x 3.  She was taken to the recovery room in stable  condition.    There was no air conditioning in OR during procedure. Will give patient additional dose of ancef 2 g postoperatively.   Rolm BookbinderAmber Mikea Quadros, DO Faculty Practice, [facility] [place]

## 2017-04-05 NOTE — H&P (Signed)
Obstetric Preoperative History and Physical  Melinda Richardson is a 21 y.o. G2P0100 with IUP at 84101w1d presenting for scheduled cesarean section for classical vertical incision for preterm labor with advanced dilation at 234w1d.  No acute concerns.   [facility] Course [facility] Pregnancy complications or risks: Patient Active Problem List   Diagnosis Date Noted  . GBS (group B Streptococcus carrier), +RV culture, currently pregnant 04/01/2017  . Supervision of high risk pregnancy, antepartum 01/10/2017  . History of preterm delivery, currently pregnant 01/10/2017  . ASCUS with positive high risk HPV cervical 01/10/2017  . Maternal varicella, non-immune 01/10/2017  . S/P emergency C-section 12/17/2014  . Preterm premature rupture of membranes (PPROM) with unknown onset of labor 12/11/2014  . Cervical incompetence during pregnancy in second trimester 12/09/2014   She plans to breastfeed, plans to bottle feed She desires Depo-Provera for postpartum contraception.   Prenatal labs and studies:  Clinic  [facility] Prenatal Labs  Dating LMP c/w 13 week scan Blood type: O/Positive/-- (09/24 0000)   Genetic Screen Quad: Normal Antibody:Negative (09/24 0000)  Anatomic US Normal Rubella: immune  GTT Third trimester: 84/152/117 RPR: Nonreactive (09/24 0000)   Flu vaccine 10/17 HBsAg:   neg  TDaP vaccine 10/17                                               HIV:   NR  Baby Food Breast and bottle                                              GBS: positive  Contraception Condoms Pap:  Normal   Circumcision Yes   Pediatrician [facility] CF:  Support Person   Boyfriend, Melinda Richardson SMA  Prenatal Classes No Hgb electrophoresis:     Prenatal Transfer Tool  Maternal Diabetes: No Genetic Screening: Normal Maternal Ultrasounds/Referrals: Normal Fetal Ultrasounds or other Referrals:  None Maternal Substance Abuse:  No Significant Maternal Medications:  None Significant Maternal Lab Results:  Lab values include: Group B Strep positive  Past Medical History:  Diagnosis Date  . Anemia   . Medical history non-contributory   . Preterm labor     Past Surgical History:  Procedure Laterality Date  . CESAREAN SECTION N/A 12/17/2014   Procedure: CESAREAN SECTION;  Surgeon: Tilda BurrowJohn Ferguson V, MD;  [facility];  Service: Obstetrics;  Laterality: N/A;  . CESAREAN SECTION  12/17/14   PTL @ 24 wks - footling breech  . CESAREAN SECTION CLASSICAL  12/17/2014   at 6872w2d  . TONSILLECTOMY      OB History  Gravida Para Term Preterm AB Living  2 1 0 1 0 0  SAB TAB Ectopic Multiple Live Births  0 0 0   1    # Outcome Date GA Lbr Len/2nd Weight Sex Delivery Anes PTL Lv  2 Current           1 Preterm 12/17/14 364w1d  1 lb 7.6 oz (0.669 kg) M CS-High Vert Spinal  DEC     Birth Comments: extreme preterm; Died about 1 month of age      Social History   Socioeconomic History  . Marital status: Single    Spouse name: None  . Number  of children: None  . Years of education: None  . Highest education level: None  Social Needs  . Financial resource strain: None  . Food insecurity - worry: None  . Food insecurity - inability: None  . Transportation needs - medical: None  . Transportation needs - non-medical: None  Occupational History  . None  Tobacco Use  . Smoking status: Never Smoker  . Smokeless tobacco: Never Used  Substance and Sexual Activity  . Alcohol use: No  . Drug use: No  . Sexual activity: Not Currently    Birth control/protection: None    Comment: desires Nexplanon  Other Topics Concern  . None  Social History Narrative   ** Merged History Encounter **        Family History  Problem Relation Age of Onset  . Kidney failure Father   . Diabetes Mother     Facility-Administered Medications Prior to Admission  Medication Dose Route Frequency Provider Last Rate Last Dose  . Tdap (BOOSTRIX) injection 0.5 mL  0.5 mL Intramuscular Once Reva Bores, MD        Medications Prior to Admission  Medication Sig Dispense Refill Last Dose  . Prenatal Multivit-Min-Fe-FA (PRENATAL VITAMINS PO) Take 1 tablet by mouth daily.   Past Week at Unknown time  . progesterone (PROMETRIUM) 200 MG capsule Place one capsule vaginally at bedtime (Patient taking differently: Place 200 mg vaginally at bedtime. Place one capsule vaginally at bedtime) 30 capsule 3 Past Week at Unknown time    No Known Allergies  Review of Systems: Negative except for what is mentioned in HPI.  Physical Exam: BP 125/76   Pulse 90   Temp 98.1 F (36.7 C) (Oral)   Resp 20   Ht 5\' 5"  (1.651 m)   Wt 272 lb (123.4 kg)   LMP 07/19/2016 (Approximate)   BMI 45.26 kg/m    CONSTITUTIONAL: Well-developed, well-nourished female in no acute distress.  HENT:  Normocephalic, atraumatic. Oropharynx is clear and moist EYES: Conjunctivae and EOM are normal. No scleral icterus.  NECK: Normal range of motion, supple SKIN: Skin is warm and dry. No rash noted. Not diaphoretic. No erythema. NEUROLGIC: Alert and oriented to person, place, and time. Normal reflexes, muscle tone coordination. No cranial nerve deficit noted. PSYCHIATRIC: Normal mood and affect. Normal behavior. CARDIOVASCULAR: Normal heart rate noted, regular rhythm RESPIRATORY: Effort and breath sounds normal, no problems with respiration noted ABDOMEN: Soft, nontender, nondistended, gravid. Well-healed Pfannenstiel incision. PELVIC: Deferred MUSCULOSKELETAL: Normal range of motion. No edema and no tenderness. 2+ distal pulses.   Pertinent Labs/Studies:   Results for orders placed or performed during the hospital encounter of 04/04/17 (from the past 72 hour(s))  CBC     Status: Abnormal   Collection Time: 04/04/17 11:00 AM  Result Value Ref Range   WBC 7.6 4.0 - 10.5 K/uL   RBC 3.58 (L) 3.87 - 5.11 MIL/uL   Hemoglobin 10.0 (L) 12.0 - 15.0 g/dL   HCT 16.1 (L) 09.6 - 04.5 %   MCV 85.5 78.0 - 100.0 fL   MCH 27.9 26.0 - 34.0 pg    MCHC 32.7 30.0 - 36.0 g/dL   RDW 40.9 81.1 - 91.4 %   Platelets 244 150 - 400 K/uL  RPR     Status: None   Collection Time: 04/04/17 11:00 AM  Result Value Ref Range   RPR Ser Ql Non Reactive Non Reactive    Comment: (NOTE) Performed At: [facility] [address] [place],  [place] [id number] Jolene SchimkeNagendra Sanjai MD WU:[id number]:[phone]   Type and screen     Status: None   Collection Time: 04/04/17 11:00 AM  Result Value Ref Range   ABO/RH(D) O POS    Antibody Screen NEG    Sample Expiration 04/07/2017     Assessment and Plan :Celesta GentileCamary N Nell is a 21 y.o. G2P0100 at 5539w1d being admitted for scheduled cesarean section. The risks of cesarean section discussed with the patient included but were not limited to: bleeding which may require transfusion or reoperation; infection which may require antibiotics; injury to bowel, bladder, ureters or other surrounding organs; injury to the fetus; need for additional procedures including hysterectomy in the event of a life-threatening hemorrhage; placental abnormalities wth subsequent pregnancies, incisional problems, thromboembolic phenomenon and other postoperative/anesthesia complications. The patient concurred with the proposed plan, giving informed written consent for the procedure. Patient has been NPO since last night she will remain NPO for procedure. Anesthesia and OR aware. Preoperative prophylactic antibiotics and SCDs ordered on call to the OR. To OR when ready.   MOF-breast and bottle MOC- depo shot   Rolm BookbinderAmber Damonica Chopra, DO OB Fellow Faculty Practice, [facility]

## 2017-04-05 NOTE — Anesthesia Preprocedure Evaluation (Signed)
Anesthesia Evaluation  Patient identified by MRN, date of birth, ID band Patient awake    Reviewed: Allergy & Precautions, H&P , Patient's Chart, lab work & pertinent test results, reviewed documented beta blocker date and time   Airway Mallampati: II  TM Distance: >3 FB Neck ROM: full    Dental no notable dental hx.    Pulmonary    Pulmonary exam normal breath sounds clear to auscultation       Cardiovascular  Rhythm:regular Rate:Normal     Neuro/Psych    GI/Hepatic   Endo/Other  Morbid obesity  Renal/GU      Musculoskeletal   Abdominal   Peds  Hematology   Anesthesia Other Findings   Reproductive/Obstetrics                             Anesthesia Physical Anesthesia Plan  ASA: III  Anesthesia Plan: Spinal   Post-op Pain Management:    Induction:   PONV Risk Score and Plan:   Airway Management Planned:   Additional Equipment:   Intra-op Plan:   Post-operative Plan:   Informed Consent: I have reviewed the patients History and Physical, chart, labs and discussed the procedure including the risks, benefits and alternatives for the proposed anesthesia with the patient or authorized representative who has indicated his/her understanding and acceptance.   Dental Advisory Given  Plan Discussed with: CRNA and Surgeon  Anesthesia Plan Comments: (  )        Anesthesia Quick Evaluation

## 2017-04-05 NOTE — Plan of Care (Signed)
POC and admission education discussed with FOB and pt, no questions or concerns at this time.

## 2017-04-06 ENCOUNTER — Encounter (HOSPITAL_COMMUNITY): Payer: Self-pay | Admitting: Family Medicine

## 2017-04-06 LAB — CBC
HCT: 27.7 % — ABNORMAL LOW (ref 36.0–46.0)
Hemoglobin: 9.1 g/dL — ABNORMAL LOW (ref 12.0–15.0)
MCH: 28 pg (ref 26.0–34.0)
MCHC: 32.9 g/dL (ref 30.0–36.0)
MCV: 85.2 fL (ref 78.0–100.0)
PLATELETS: 206 10*3/uL (ref 150–400)
RBC: 3.25 MIL/uL — AB (ref 3.87–5.11)
RDW: 13.5 % (ref 11.5–15.5)
WBC: 7.1 10*3/uL (ref 4.0–10.5)

## 2017-04-06 LAB — BIRTH TISSUE RECOVERY COLLECTION (PLACENTA DONATION)

## 2017-04-06 NOTE — Anesthesia Postprocedure Evaluation (Signed)
Anesthesia Post Note  Patient: Melinda Richardson  Procedure(s) Performed: CESAREAN SECTION (N/A )     Patient location during evaluation: PACU Anesthesia Type: Spinal Level of consciousness: awake Pain management: satisfactory to patient Vital Signs Assessment: post-procedure vital signs reviewed and stable Respiratory status: spontaneous breathing Cardiovascular status: blood pressure returned to baseline Postop Assessment: no headache and spinal receding Anesthetic complications: no    Last Vitals:  Vitals:   04/06/17 0904 04/06/17 1116  BP: (!) 119/58   Pulse: 81   Resp: 16   Temp: 36.9 C   SpO2: 100% 98%    Last Pain:  Vitals:   04/06/17 1515  TempSrc:   PainSc: 3                  Kierstan Auer EDWARD

## 2017-04-06 NOTE — Progress Notes (Signed)
Subjective: Postpartum Day 1: Cesarean Delivery Patient reports incisional pain, tolerating PO, + flatus and no problems voiding.    Objective: Vital signs in last 24 hours: Temp:  [97.7 F (36.5 C)-98.5 F (36.9 C)] 97.7 F (36.5 C) (12/28 0531) Pulse Rate:  [72-90] 90 (12/28 0116) Resp:  [12-20] 18 (12/28 0531) BP: (101-139)/(51-108) 103/51 (12/28 0116) SpO2:  [97 %-100 %] 100 % (12/28 0300) Weight:  [272 lb (123.4 kg)] 272 lb (123.4 kg) (12/27 1039)  Physical Exam:  General: alert, cooperative and no distress Lochia: appropriate Uterine Fundus: firm Incision: healing well, no significant drainage, no dehiscence DVT Evaluation: No evidence of DVT seen on physical exam. Negative Homan's sign. No cords or calf tenderness. No significant calf/ankle edema.  Recent Labs    04/05/17 1538 04/06/17 0617  HGB 9.4* 9.1*  HCT 28.2* 27.7*    Assessment/Plan: Status post Cesarean section. Doing well postoperatively.  Continue current care.  Melinda Richardson 04/06/2017, 7:34 AM

## 2017-04-07 MED ORDER — IBUPROFEN 600 MG PO TABS: 600.0000 mg | ORAL_TABLET | Freq: Four times a day (QID) | ORAL | 0 refills | Status: DC | PRN

## 2017-04-07 MED ORDER — OXYCODONE-ACETAMINOPHEN 5-325 MG PO TABS
2.0000 | ORAL_TABLET | ORAL | 0 refills | Status: DC | PRN
Start: 2017-04-07 — End: 2017-10-03

## 2017-04-07 NOTE — Discharge Summary (Signed)
OB Discharge Summary     Patient Name: Melinda Richardson DOB: 01-05-1996 MRN: [id number]  Date of admission: 04/05/2017 Delivering MD: Levie HeritageSTINSON, JACOB J   Date of discharge: 04/07/2017  Admitting diagnosis: RCS Intrauterine pregnancy: 6025w1d     Secondary diagnosis:  Principal Problem:   Status post C-section Active Problems:   Maternal varicella, non-immune   GBS (group B Streptococcus carrier), +RV culture, currently pregnant  Additional problems: prev classical/vertical incision for 24wk del; ASCUS w/ HRHPV     Discharge diagnosis: Term Pregnancy Delivered and Anemia                                                                                                Post partum procedures:none  Augmentation: N/A  Complications: None  Hospital course:  Scheduled C/S   21 y.o. yo G2P1101 at 6525w1d was admitted to the hospital 04/05/2017 for scheduled cesarean section with the following indication:prev classical/vertical C/S.  Membrane Rupture Time/Date: 12:14 PM ,04/05/2017   Patient delivered a Viable infant.04/05/2017  Details of operation can be found in separate operative note.  Pateint had an uncomplicated postpartum course.  She is ambulating, tolerating a regular diet, passing flatus, and urinating well. Patient is discharged home in stable condition on  04/07/17         Physical exam  Vitals:   04/06/17 0904 04/06/17 1116 04/06/17 2153 04/07/17 0607  BP: (!) 119/58  121/62 134/71  Pulse: 81  83 89  Resp: 16  16 18   Temp: 98.4 F (36.9 C)  99.1 F (37.3 C) 98.4 F (36.9 C)  TempSrc: Oral  Oral Oral  SpO2: 100% 98%  100%  Weight:      Height:       General: alert and cooperative Lochia: appropriate Uterine Fundus: firm Incision: LTCS inc honeycomb dry and intact DVT Evaluation: No evidence of DVT seen on physical exam. Labs: Lab Results  Component Value Date   WBC 7.1 04/06/2017   HGB 9.1 (L) 04/06/2017   HCT 27.7 (L) 04/06/2017   MCV 85.2 04/06/2017   PLT 206 04/06/2017   CMP Latest Ref Rng & Units 04/05/2017  Glucose 65 - 99 mg/dL -  BUN 6 - 20 mg/dL -  Creatinine 4.090.44 - 8.111.00 mg/dL 9.140.53  Sodium 782135 - 956145 mmol/L -  Potassium 3.5 - 5.1 mmol/L -  Chloride 101 - 111 mmol/L -  CO2 22 - 32 mmol/L -  Calcium 8.9 - 10.3 mg/dL -  Total Protein 6.5 - 8.1 g/dL -  Total Bilirubin 0.3 - 1.2 mg/dL -  Alkaline Phos 38 - 213126 U/L -  AST 15 - 41 U/L -  ALT 14 - 54 U/L -    Discharge instruction: per After Visit Summary and "Baby and Me Booklet".  After visit meds:  Allergies as of 04/07/2017   No Known Allergies     Medication List    STOP taking these medications   progesterone 200 MG capsule Commonly known as:  PROMETRIUM     TAKE these medications   ibuprofen 600 MG tablet Commonly known as:  ADVIL,MOTRIN  Take 1 tablet (600 mg total) by mouth every 6 (six) hours as needed.   oxyCODONE-acetaminophen 5-325 MG tablet Commonly known as:  PERCOCET/ROXICET Take 2 tablets by mouth every 4 (four) hours as needed for moderate pain or severe pain.   PRENATAL VITAMINS PO Take 1 tablet by mouth daily.       Diet: routine diet  Activity: Advance as tolerated. Pelvic rest for 6 weeks.   Outpatient follow up:4 weeks- needs colpo PP (inbox sent) Follow up Appt:No future appointments. Follow up Visit:No Follow-up on file.  Postpartum contraception: Depo Provera  Newborn Data: Live born female  Birth Weight: 6 lb 7.4 oz (2930 g) APGAR: 10, 10  Newborn Delivery   Birth date/time:  04/05/2017 12:15:00 Delivery type:  C-Section, Vacuum Assisted C-section categorization:  Repeat     Baby Feeding: Bottle Disposition:home with mother   04/07/2017 Cam HaiSHAW, Simuel Stebner, CNM 9:02 AM

## 2017-04-07 NOTE — Discharge Instructions (Signed)

## 2017-04-07 NOTE — Clinical Social Work Note (Signed)
CSW received consult for hx of marijuana use.  Referral was screened out due to the following: ~MOB had no documented substance use after initial prenatal visit/+UPT. ~MOB had no positive drug screens after initial prenatal visit/+UPT. ~Baby's UDS is negative.  Please consult CSW if current concerns arise or by MOB's request.  CSW will monitor CDS results and make report to Child Protective Services if warranted. 

## 2017-04-18 ENCOUNTER — Ambulatory Visit: Payer: Medicaid Other

## 2017-04-18 VITALS — BP 138/79 | HR 73 | Wt 268.5 lb

## 2017-04-18 DIAGNOSIS — Z5189 Encounter for other specified aftercare: Secondary | ICD-10-CM

## 2017-04-18 NOTE — Progress Notes (Signed)
Patient presented to the office for her 2 week post op incision check. Honey comb dressing and sutures were removed. Incision was cleaned with NS. Pt instructed to continue to keep wound clean and dry. [facility]

## 2017-04-19 ENCOUNTER — Ambulatory Visit: Payer: Medicaid Other

## 2017-05-14 ENCOUNTER — Ambulatory Visit: Payer: Medicaid Other | Admitting: Family Medicine

## 2017-05-23 ENCOUNTER — Encounter: Payer: Self-pay | Admitting: Family Medicine

## 2017-06-26 ENCOUNTER — Telehealth: Payer: Self-pay | Admitting: General Practice

## 2017-06-26 ENCOUNTER — Ambulatory Visit: Payer: Medicaid Other | Admitting: Obstetrics & Gynecology

## 2017-06-26 ENCOUNTER — Telehealth: Payer: Self-pay

## 2017-06-26 NOTE — Telephone Encounter (Signed)
Called patient to reschedule Postpartum visit with Colpo. Patient is scheduled for 07/25/17 at 2:35pm.  Patient voiced understanding.

## 2017-06-26 NOTE — Telephone Encounter (Addendum)
Contacted pt in regards to missed appt.  Pt stated that she has forgotten about the appt.  I informed pt that the front office will contact her in regards to missed appt. Pt stated thank you with no further questions.   Note to front office sent to call pt with an appt.

## 2017-07-25 ENCOUNTER — Ambulatory Visit: Payer: Self-pay | Admitting: Obstetrics & Gynecology

## 2017-07-26 NOTE — Progress Notes (Signed)
She has family planning medicaid.  She will go through [facility] so that her colpo is covered.

## 2017-08-01 ENCOUNTER — Telehealth: Payer: Self-pay | Admitting: General Practice

## 2017-08-01 NOTE — Telephone Encounter (Signed)
Left message on VM for patient to give our office call in regards to appointment on 08/23/17 at 9:55am.

## 2017-08-23 ENCOUNTER — Ambulatory Visit: Payer: Medicaid Other | Admitting: Student

## 2017-08-23 ENCOUNTER — Ambulatory Visit: Payer: Medicaid Other | Admitting: Obstetrics & Gynecology

## 2017-09-10 ENCOUNTER — Ambulatory Visit: Payer: Medicaid Other | Admitting: Obstetrics & Gynecology

## 2017-10-01 ENCOUNTER — Encounter (HOSPITAL_COMMUNITY): Payer: Self-pay

## 2017-10-03 ENCOUNTER — Encounter (HOSPITAL_COMMUNITY): Payer: Self-pay

## 2017-10-03 ENCOUNTER — Ambulatory Visit (HOSPITAL_COMMUNITY)
Admission: EM | Admit: 2017-10-03 | Discharge: 2017-10-03 | Disposition: A | Discharge status: Home / Self Care | Payer: Medicaid Other | Attending: Family Medicine | Admitting: Family Medicine

## 2017-10-03 DIAGNOSIS — Z862 Personal history of diseases of the blood and blood-forming organs and certain disorders involving the immune mechanism: Secondary | ICD-10-CM

## 2017-10-03 DIAGNOSIS — R42 Dizziness and giddiness: Secondary | ICD-10-CM

## 2017-10-03 LAB — POCT I-STAT, CHEM 8
BUN: 7 mg/dL (ref 6–20)
CHLORIDE: 100 mmol/L (ref 98–111)
CREATININE: 0.6 mg/dL (ref 0.44–1.00)
Calcium, Ion: 1.19 mmol/L (ref 1.15–1.40)
Glucose, Bld: 89 mg/dL (ref 70–99)
HCT: 34 % — ABNORMAL LOW (ref 36.0–46.0)
HEMOGLOBIN: 11.6 g/dL — AB (ref 12.0–15.0)
Potassium: 3.7 mmol/L (ref 3.5–5.1)
SODIUM: 139 mmol/L (ref 135–145)
TCO2: 25 mmol/L (ref 22–32)

## 2017-10-03 MED ORDER — MECLIZINE HCL 25 MG PO TABS: 25.0000 mg | ORAL_TABLET | Freq: Three times a day (TID) | ORAL | 0 refills | Status: DC | PRN

## 2017-10-03 NOTE — ED Provider Notes (Signed)
[facility]    CSN: [id number] Arrival date & time: 10/03/17  1304     History   Chief Complaint No chief complaint on file.   HPI Melinda Richardson is a 22 y.o. female.   22 year old female with history of anemia comes in for 2-day history of dizziness, lightheadedness.  States at times she feels like her  head is spinning, worse with bending over and standing up.  Denies any symptoms if she is sitting down.  Denies nausea, vomiting.  Had left-sided headache that is mild and cramping in sensation that has since resolved.  Denies photophobia, phonophobia. Denies URI symptoms such as cough, congestion, sore throat. States noticed some low abdominal pain yesterday that is intermittent, worse with movement. Denies urinary symptoms such as frequency, dysuria, hematuria.  Denies vaginal symptoms such as discharge, itching, pain.  LMP 09/20/2017.  Denies breast-feeding.     Past Medical History:  Diagnosis Date  . Anemia   . Medical history non-contributory   . Preterm labor     Patient Active Problem List   Diagnosis Date Noted  . Status post C-section 04/05/2017  . GBS (group B Streptococcus carrier), +RV culture, currently pregnant 04/01/2017  . Supervision of high risk pregnancy, antepartum 01/10/2017  . History of preterm delivery, currently pregnant 01/10/2017  . ASCUS with positive high risk HPV cervical 01/10/2017  . Maternal varicella, non-immune 01/10/2017  . S/P emergency C-section 12/17/2014  . Preterm premature rupture of membranes (PPROM) with unknown onset of labor 12/11/2014  . Cervical incompetence during pregnancy in second trimester 12/09/2014    Past Surgical History:  Procedure Laterality Date  . CESAREAN SECTION N/A 12/17/2014   Procedure: CESAREAN SECTION;  Surgeon: Tilda BurrowJohn Ferguson V, MD;  Location: [facility];  Service: Obstetrics;  Laterality: N/A;  . CESAREAN SECTION  12/17/14   PTL @ 24 wks - footling breech  . CESAREAN SECTION N/A 04/05/2017   Procedure: CESAREAN SECTION;  Surgeon: Levie HeritageStinson, Jacob J, DO;  [facility];  Service: Obstetrics;  Laterality: N/A;  . CESAREAN SECTION CLASSICAL  12/17/2014   at 8148w2d  . TONSILLECTOMY      OB History    Gravida  2   Para  2   Term  1   Preterm  1   AB  0   Living  1     SAB  0   TAB  0   Ectopic  0   Multiple  0   Live Births  2            Home Medications    Prior to Admission medications   Medication Sig Start Date End Date Taking? Authorizing Provider  meclizine (ANTIVERT) 25 MG tablet Take 1 tablet (25 mg total) by mouth 3 (three) times daily as needed for dizziness. 10/03/17   Belinda FisherYu, Amy V, PA-C    Family History Family History  Problem Relation Age of Onset  . Kidney failure Father   . Diabetes Mother     Social History Social History   Tobacco Use  . Smoking status: Never Smoker  . Smokeless tobacco: Never Used  Substance Use Topics  . Alcohol use: No  . Drug use: No     Allergies   Patient has no known allergies.   Review of Systems Review of Systems  Reason unable to perform ROS: See HPI as above.     Physical Exam Triage Vital Signs ED Triage Vitals  Enc Vitals Group  BP 10/03/17 1356 132/66     Pulse Rate 10/03/17 1356 74     Resp 10/03/17 1356 20     Temp 10/03/17 1356 (!) 97.2 F (36.2 C)     Temp src --      SpO2 10/03/17 1356 100 %     Weight --      Height --      Head Circumference --      Peak Flow --      Pain Score 10/03/17 1354 0     Pain Loc --      Pain Edu? --      Excl. in GC? --    No data found.  Updated Vital Signs BP 132/66   Pulse 74   Temp (!) 97.2 F (36.2 C)   Resp 20   LMP 08/20/2017   SpO2 100%   Physical Exam  Constitutional: She is oriented to person, place, and time. She appears well-developed and well-nourished. No distress.  HENT:  Head: Normocephalic and atraumatic.  Right Ear: Tympanic membrane, external ear and ear canal normal. Tympanic membrane is not  erythematous and not bulging.  Left Ear: Tympanic membrane, external ear and ear canal normal. Tympanic membrane is not erythematous and not bulging.  Nose: Nose normal. Right sinus exhibits no maxillary sinus tenderness and no frontal sinus tenderness. Left sinus exhibits no maxillary sinus tenderness and no frontal sinus tenderness.  Mouth/Throat: Uvula is midline, oropharynx is clear and moist and mucous membranes are normal.  Eyes: Pupils are equal, round, and reactive to light. Conjunctivae and EOM are normal.  Neck: Normal range of motion. Neck supple.  Cardiovascular: Normal rate, regular rhythm and normal heart sounds. Exam reveals no gallop and no friction rub.  No murmur heard. Pulmonary/Chest: Effort normal and breath sounds normal. No stridor. No respiratory distress. She has no decreased breath sounds. She has no wheezes. She has no rhonchi. She has no rales.  Abdominal: Soft. Bowel sounds are normal. There is no tenderness. There is no rebound and no guarding.  Lymphadenopathy:    She has no cervical adenopathy.  Neurological: She is alert and oriented to person, place, and time. She has normal strength. She is not disoriented. No cranial nerve deficit or sensory deficit. She displays a negative Romberg sign. Coordination and gait normal. GCS eye subscore is 4. GCS verbal subscore is 5. GCS motor subscore is 6.  Normal finger-to-nose, rapid movement.  No pronator drift.  Patient able to ambulate on and off exam table without problems or hesitancy.  Skin: Skin is warm and dry. She is not diaphoretic.  Psychiatric: She has a normal mood and affect. Her behavior is normal. Judgment normal.   UC Treatments / Results  Labs (all labs ordered are listed, but only abnormal results are displayed) Labs Reviewed  POCT I-STAT, CHEM 8 - Abnormal; Notable for the following components:      Result Value   Hemoglobin 11.6 (*)    HCT 34.0 (*)    All other components within normal limits     EKG None  Radiology No results found.  Procedures Procedures (including critical care time)  Medications Ordered in UC Medications - No data to display  Initial Impression / Assessment and Plan / UC Course  I have reviewed the triage vital signs and the nursing notes.  Pertinent labs & imaging results that were available during my care of the patient were reviewed by me and considered in my medical decision making (  see chart for details).    I-STAT with hemoglobin of 11.6, improved from prior blood work.  Discussed with patient no alarming signs on exam, normal neurology exam.  Will have patient push fluids, rest.  Meclizine as needed for dizziness.  Return precautions given.  Patient expresses understanding and agrees to plan.  Final Clinical Impressions(s) / UC Diagnoses   Final diagnoses:  Dizziness    ED Prescriptions    Medication Sig Dispense Auth. Provider   meclizine (ANTIVERT) 25 MG tablet Take 1 tablet (25 mg total) by mouth 3 (three) times daily as needed for dizziness. 15 tablet Threasa Alpha, [place] 10/03/17 1612

## 2017-10-03 NOTE — Discharge Instructions (Signed)
No alarming signs on exam.  Your blood work was normal.  No anemia today.  Start meclizine for dizziness.  As discussed, please stay hydrated, urine should be clear to pale yellow in color. If experiencing worsening of symptoms, headache/blurry vision, nausea/vomiting, confusion/altered mental status, weakness, passing out, imbalance, go to the emergency department for further evaluation.

## 2017-10-03 NOTE — ED Triage Notes (Signed)
Pt presents with complaints of dizziness x 1 day. Denies any other symptoms.

## 2017-10-23 ENCOUNTER — Ambulatory Visit (HOSPITAL_COMMUNITY): Payer: Medicaid Other

## 2018-02-02 ENCOUNTER — Ambulatory Visit (HOSPITAL_COMMUNITY)
Admission: EM | Admit: 2018-02-02 | Discharge: 2018-02-02 | Disposition: A | Discharge status: Home / Self Care | Payer: Medicaid Other | Attending: Family Medicine | Admitting: Family Medicine

## 2018-02-02 ENCOUNTER — Encounter (HOSPITAL_COMMUNITY): Payer: Self-pay

## 2018-02-02 DIAGNOSIS — R109 Unspecified abdominal pain: Secondary | ICD-10-CM

## 2018-02-02 DIAGNOSIS — Z3201 Encounter for pregnancy test, result positive: Secondary | ICD-10-CM

## 2018-02-02 DIAGNOSIS — N925 Other specified irregular menstruation: Secondary | ICD-10-CM

## 2018-02-02 DIAGNOSIS — Z3491 Encounter for supervision of normal pregnancy, unspecified, first trimester: Secondary | ICD-10-CM

## 2018-02-02 LAB — POCT PREGNANCY, URINE: Preg Test, Ur: POSITIVE — AB

## 2018-02-02 NOTE — ED Provider Notes (Signed)
[facility]    CSN: [id number] Arrival date & time: 02/02/18  1344     History   Chief Complaint Chief Complaint  Patient presents with  . Abdominal Pain  . Possible Pregnancy    HPI Melinda Richardson is a 22 y.o. female.   Established 22 yo female with delayed menses and abdominal pain.  Has a 26-month-old at home.  She works at [employer].  His menstrual period was December 27, 2017 giving her an [facility] of October 04, 2018     Past Medical History:  Diagnosis Date  . Anemia   . Medical history non-contributory   . Preterm labor     Patient Active Problem List   Diagnosis Date Noted  . Status post C-section 04/05/2017  . GBS (group B Streptococcus carrier), +RV culture, currently pregnant 04/01/2017  . Supervision of high risk pregnancy, antepartum 01/10/2017  . History of preterm delivery, currently pregnant 01/10/2017  . ASCUS with positive high risk HPV cervical 01/10/2017  . Maternal varicella, non-immune 01/10/2017  . S/P emergency C-section 12/17/2014  . Preterm premature rupture of membranes (PPROM) with unknown onset of labor 12/11/2014  . Cervical incompetence during pregnancy in second trimester 12/09/2014    Past Surgical History:  Procedure Laterality Date  . CESAREAN SECTION N/A 12/17/2014   Procedure: CESAREAN SECTION;  Surgeon: [name], MD;  Location: [facility];  Service: Obstetrics;  Laterality: N/A;  . CESAREAN SECTION  12/17/14   PTL @ 24 wks - footling breech  . CESAREAN SECTION N/A 04/05/2017   Procedure: CESAREAN SECTION;  Surgeon: [name], DO;  Location: [facility] BIRTHING SUIT[facility];  Service: Obstetrics;  Laterality: N/A;  . CESAREAN SECTION CLASSICAL  12/17/2014   at [redacted]w[redacted]d  . TONSILLECTOMY      OB History    Gravida  2   Para  2   Term  1   Preterm  1   AB  0   Living  1     SAB  0   TAB  0   Ectopic  0   Multiple  0   Live Births  2            Home Medications    Prior to Admission  medications   Medication Sig Start Date End Date Taking? Authorizing Provider  meclizine (ANTIVERT) 25 MG tablet Take 1 tablet (25 mg total) by mouth 3 (three) times daily as needed for dizziness. 10/03/17   Belinda Fisher, PA-C    Family History Family History  Problem Relation Age of Onset  . Kidney failure Father   . Diabetes Mother     Social History Social History   Tobacco Use  . Smoking status: Never Smoker  . Smokeless tobacco: Never Used  Substance Use Topics  . Alcohol use: No  . Drug use: No     Allergies   Patient has no known allergies.   Review of Systems Review of Systems   Physical Exam Triage Vital Signs ED Triage Vitals  Enc Vitals Group     BP 02/02/18 1437 130/75     Pulse Rate 02/02/18 1437 88     Resp 02/02/18 1437 20     Temp 02/02/18 1437 98.6 F (37 C)     Temp Source 02/02/18 1437 Oral     SpO2 02/02/18 1437 100 %     Weight --      Height --      Head  Circumference --      Peak Flow --      Pain Score 02/02/18 1438 6     Pain Loc --      Pain Edu? --      Excl. in GC? --    No data found.  Updated Vital Signs BP 130/75 (BP Location: Left Arm)   Pulse 88   Temp 98.6 F (37 C) (Oral)   Resp 20   LMP 12/27/2017   SpO2 100%    Physical Exam  Constitutional: She appears well-developed and well-nourished.  HENT:  Head: Normocephalic and atraumatic.  Eyes: Pupils are equal, round, and reactive to light.  Cardiovascular: Normal rate.  Pulmonary/Chest: Effort normal.  Skin: Skin is warm and dry.  Nursing note and vitals reviewed.    UC Treatments / Results  Labs (all labs ordered are listed, but only abnormal results are displayed) Labs Reviewed  POCT PREGNANCY, URINE - Abnormal; Notable for the following components:      Result Value   Preg Test, Ur POSITIVE (*)    All other components within normal limits    EKG None  Radiology No results found.  Procedures Procedures (including critical care  time)  Medications Ordered in UC Medications - No data to display  Initial Impression / Assessment and Plan / UC Course  I have reviewed the triage vital signs and the nursing notes.  Pertinent labs & imaging results that were available during my care of the patient were reviewed by me and considered in my medical decision making (see chart for details).    Final Clinical Impressions(s) / UC Diagnoses   Final diagnoses:  First trimester pregnancy     Discharge Instructions     Restart your prenatal vitamins  Follow-up at [facility] clinic    ED Prescriptions    None     Controlled Substance Prescriptions [place] Controlled Substance Registry consulted? Not Applicable   Elvina Sidle, MD 02/02/18 1456

## 2018-02-02 NOTE — Discharge Instructions (Addendum)
Restart your prenatal vitamins  Follow-up at [facility] clinic

## 2018-02-02 NOTE — ED Triage Notes (Signed)
Pt presents with some generalized abdominal pain and has missed her period by 5 days.

## 2018-02-10 ENCOUNTER — Encounter (HOSPITAL_COMMUNITY): Payer: Self-pay

## 2018-02-10 ENCOUNTER — Other Ambulatory Visit: Payer: Self-pay

## 2018-02-10 ENCOUNTER — Emergency Department (HOSPITAL_COMMUNITY)
Admission: EM | Admit: 2018-02-10 | Discharge: 2018-02-11 | Disposition: A | Discharge status: Home / Self Care | Payer: Medicaid Other | Attending: Emergency Medicine | Admitting: Emergency Medicine

## 2018-02-10 DIAGNOSIS — O26891 Other specified pregnancy related conditions, first trimester: Secondary | ICD-10-CM | POA: Insufficient documentation

## 2018-02-10 DIAGNOSIS — N898 Other specified noninflammatory disorders of vagina: Secondary | ICD-10-CM | POA: Insufficient documentation

## 2018-02-10 LAB — URINALYSIS, ROUTINE W REFLEX MICROSCOPIC
Bacteria, UA: NONE SEEN
Bilirubin Urine: NEGATIVE
Glucose, UA: NEGATIVE mg/dL
Hgb urine dipstick: NEGATIVE
Ketones, ur: NEGATIVE mg/dL
Nitrite: NEGATIVE
Protein, ur: NEGATIVE mg/dL
Specific Gravity, Urine: 1.028 (ref 1.005–1.030)
pH: 6 (ref 5.0–8.0)

## 2018-02-10 LAB — I-STAT BETA HCG BLOOD, ED (MC, WL, AP ONLY): I-stat hCG, quantitative: >2000 m[IU]/mL — ABNORMAL HIGH (ref <5)

## 2018-02-10 NOTE — ED Triage Notes (Signed)
Patient c/o dysuria and vaginal itching for 1 week. Was dx with a (+) pregnancy test one week ago at Urgent Care.

## 2018-02-11 LAB — GC/CHLAMYDIA PROBE AMP (~~LOC~~) NOT AT ARMC
Chlamydia: NEGATIVE
Neisseria Gonorrhea: NEGATIVE

## 2018-02-11 LAB — WET PREP, GENITAL
Sperm: NONE SEEN
Trich, Wet Prep: NONE SEEN
Yeast Wet Prep HPF POC: NONE SEEN

## 2018-02-11 MED ORDER — AZITHROMYCIN 250 MG PO TABS
1000.0000 mg | ORAL_TABLET | Freq: Once | ORAL | Status: AC
  Administered 2018-02-11: 1000 mg via ORAL
  Filled 2018-02-11: qty 4

## 2018-02-11 MED ORDER — CEFTRIAXONE SODIUM 250 MG IJ SOLR
250.0000 mg | Freq: Once | INTRAMUSCULAR | Status: AC
  Administered 2018-02-11: 250 mg via INTRAMUSCULAR
  Filled 2018-02-11: qty 250

## 2018-02-11 MED ORDER — STERILE WATER FOR INJECTION IJ SOLN
INTRAMUSCULAR | Status: AC
  Administered 2018-02-11: 1 mL
  Filled 2018-02-11: qty 10

## 2018-02-11 NOTE — ED Notes (Signed)
Patient given turkey sandwich and sprite °

## 2018-02-11 NOTE — Discharge Instructions (Signed)
Return here as needed.  Follow-up with the GYN clinic provided or your GYN doctor.

## 2018-02-11 NOTE — ED Provider Notes (Signed)
[facility] EMERGENCY DEPARTMENT Provider Note   CSN: [id number] Arrival date & time: 02/10/18  1911     History   Chief Complaint Chief Complaint  Patient presents with  . Dysuria    HPI Melinda Richardson is a 22 y.o. female.  HPI Patient presents to the emergency department with burning with urination along with vaginal itching for 1 week.  Patient states she is pregnant and she believes she is about 6 weeks.  Patient states that nothing seems to make the condition better or worse.  Patient states she has not taken any medications prior to arrival for her symptoms.  The patient denies chest pain, shortness of breath, headache,blurred vision, neck pain, fever, cough, weakness, numbness, dizziness, anorexia, edema, abdominal pain, nausea, vomiting, diarrhea, rash, back pain, dysuria, hematemesis, bloody stool, near syncope, or syncope. Past Medical History:  Diagnosis Date  . Anemia   . Medical history non-contributory   . Preterm labor     Patient Active Problem List   Diagnosis Date Noted  . Status post C-section 04/05/2017  . GBS (group B Streptococcus carrier), +RV culture, currently pregnant 04/01/2017  . Supervision of high risk pregnancy, antepartum 01/10/2017  . History of preterm delivery, currently pregnant 01/10/2017  . ASCUS with positive high risk HPV cervical 01/10/2017  . Maternal varicella, non-immune 01/10/2017  . S/P emergency C-section 12/17/2014  . Preterm premature rupture of membranes (PPROM) with unknown onset of labor 12/11/2014  . Cervical incompetence during pregnancy in second trimester 12/09/2014    Past Surgical History:  Procedure Laterality Date  . CESAREAN SECTION N/A 12/17/2014   Procedure: CESAREAN SECTION;  Surgeon: [name], MD;  Location: [facility];  Service: Obstetrics;  Laterality: N/A;  . CESAREAN SECTION  12/17/14   PTL @ 24 wks - footling breech  . CESAREAN SECTION N/A 04/05/2017   Procedure: CESAREAN SECTION;   Surgeon: [name], DO;  Location: [facility] BIRTHING SUITES;  Service: Obstetrics;  Laterality: N/A;  . CESAREAN SECTION CLASSICAL  12/17/2014   at [redacted]w[redacted]d  . TONSILLECTOMY       OB History    Gravida  3   Para  2   Term  1   Preterm  1   AB  0   Living  1     SAB  0   TAB  0   Ectopic  0   Multiple  0   Live Births  2            Home Medications    Prior to Admission medications   Medication Sig Start Date End Date Taking? Authorizing Provider  meclizine (ANTIVERT) 25 MG tablet Take 1 tablet (25 mg total) by mouth 3 (three) times daily as needed for dizziness. Patient not taking: Reported on 02/10/2018 10/03/17   Lurline Idol    Family History Family History  Problem Relation Age of Onset  . Kidney failure Father   . Diabetes Mother     Social History Social History   Tobacco Use  . Smoking status: Never Smoker  . Smokeless tobacco: Never Used  Substance Use Topics  . Alcohol use: No  . Drug use: No     Allergies   Patient has no known allergies.   Review of Systems Review of Systems  All other systems negative except as documented in the HPI. All pertinent positives and negatives as reviewed in the HPI. Physical Exam Updated Vital Signs BP 138/67 (BP Location:  Right Arm)   Pulse 80   Temp 98.6 F (37 C) (Oral)   Resp 18   Ht 5\' 5"  (1.651 m)   Wt 113.4 kg   LMP 12/27/2017   SpO2 100%   BMI 41.60 kg/m   Physical Exam  Constitutional: She is oriented to person, place, and time. She appears well-developed and well-nourished. No distress.  HENT:  Head: Normocephalic and atraumatic.  Mouth/Throat: Oropharynx is clear and moist.  Eyes: Pupils are equal, round, and reactive to light.  Neck: Normal range of motion. Neck supple.  Cardiovascular: Normal rate, regular rhythm and normal heart sounds. Exam reveals no gallop and no friction rub.  No murmur heard. Pulmonary/Chest: Effort normal and breath sounds normal. No respiratory  distress. She has no wheezes.  Abdominal: Soft. Bowel sounds are normal. She exhibits no distension. There is no tenderness.  Genitourinary: Pelvic exam was performed with patient supine. There is no rash, tenderness or lesion on the right labia. There is no rash, tenderness or lesion on the left labia. Cervix exhibits discharge. Vaginal discharge found.  Neurological: She is alert and oriented to person, place, and time. She exhibits normal muscle tone. Coordination normal.  Skin: Skin is warm and dry. Capillary refill takes less than 2 seconds. No rash noted. No erythema.  Psychiatric: She has a normal mood and affect. Her behavior is normal.  Nursing note and vitals reviewed.    ED Treatments / Results  Labs (all labs ordered are listed, but only abnormal results are displayed) Labs Reviewed  WET PREP, GENITAL - Abnormal; Notable for the following components:      Result Value   Clue Cells Wet Prep HPF POC PRESENT (*)    WBC, Wet Prep HPF POC MANY (*)    All other components within normal limits  URINALYSIS, ROUTINE W REFLEX MICROSCOPIC - Abnormal; Notable for the following components:   Leukocytes, UA MODERATE (*)    All other components within normal limits  I-STAT BETA HCG BLOOD, ED ([facility], [facility], [facility] ONLY) - Abnormal; Notable for the following components:   I-stat hCG, quantitative >2,000.0 (*)    All other components within normal limits  GC/CHLAMYDIA PROBE AMP ([place]) NOT AT [facility]    EKG None  Radiology No results found.  Procedures Procedures (including critical care time)  Medications Ordered in ED Medications - No data to display   Initial Impression / Assessment and Plan / ED Course  I have reviewed the triage vital signs and the nursing notes.  Pertinent labs & imaging results that were available during my care of the patient were reviewed by me and considered in my medical decision making (see chart for details).    Patient be treated for STDs  prophylactically based on the fact that she does have a thick discolored discharge.  The patient is advised she will need to follow-up with GYN.  Will give referral to the [facility] clinic.  Patient agrees to the plan and all questions were answered.   Final Clinical Impressions(s) / ED Diagnoses   Final diagnoses:  None    ED Discharge Orders    None       Charlestine Night, PA-C 02/11/18 0026    Raeford Razor, MD 02/14/18 1010

## 2018-02-11 NOTE — ED Notes (Signed)
Patient verbalizes understanding of medications and discharge instructions. No further questions at this time. VSS and patient ambulatory at discharge.   

## 2018-03-19 ENCOUNTER — Telehealth: Payer: Self-pay | Admitting: Family Medicine

## 2018-03-19 NOTE — Telephone Encounter (Signed)
LVM for patient with new OB appt. Left office number to call back if needing to reschedule. Reminder mailed

## 2018-03-29 ENCOUNTER — Other Ambulatory Visit (HOSPITAL_COMMUNITY)
Admission: RE | Admit: 2018-03-29 | Discharge: 2018-03-29 | Disposition: A | Discharge status: Home / Self Care | Payer: Medicaid Other | Source: Ambulatory Visit | Attending: Obstetrics & Gynecology | Admitting: Obstetrics & Gynecology

## 2018-03-29 ENCOUNTER — Ambulatory Visit (INDEPENDENT_AMBULATORY_CARE_PROVIDER_SITE_OTHER): Payer: Self-pay | Admitting: Obstetrics & Gynecology

## 2018-03-29 ENCOUNTER — Encounter: Payer: Self-pay | Admitting: Family Medicine

## 2018-03-29 DIAGNOSIS — Z98891 History of uterine scar from previous surgery: Secondary | ICD-10-CM

## 2018-03-29 DIAGNOSIS — O0990 Supervision of high risk pregnancy, unspecified, unspecified trimester: Secondary | ICD-10-CM | POA: Diagnosis present

## 2018-03-29 DIAGNOSIS — O09219 Supervision of pregnancy with history of pre-term labor, unspecified trimester: Principal | ICD-10-CM

## 2018-03-29 DIAGNOSIS — O09212 Supervision of pregnancy with history of pre-term labor, second trimester: Secondary | ICD-10-CM

## 2018-03-29 DIAGNOSIS — O99210 Obesity complicating pregnancy, unspecified trimester: Secondary | ICD-10-CM

## 2018-03-29 DIAGNOSIS — O99212 Obesity complicating pregnancy, second trimester: Secondary | ICD-10-CM

## 2018-03-29 DIAGNOSIS — O0992 Supervision of high risk pregnancy, unspecified, second trimester: Secondary | ICD-10-CM

## 2018-03-29 DIAGNOSIS — O09899 Supervision of other high risk pregnancies, unspecified trimester: Secondary | ICD-10-CM

## 2018-03-29 DIAGNOSIS — O42919 Preterm premature rupture of membranes, unspecified as to length of time between rupture and onset of labor, unspecified trimester: Secondary | ICD-10-CM

## 2018-03-29 MED ORDER — ASPIRIN EC 81 MG PO TBEC: 81.0000 mg | DELAYED_RELEASE_TABLET | Freq: Every day | ORAL | 3 refills | Status: DC

## 2018-03-29 MED ORDER — METRONIDAZOLE 500 MG PO TABS: 500.0000 mg | ORAL_TABLET | Freq: Two times a day (BID) | ORAL | 0 refills | Status: DC

## 2018-03-29 NOTE — Progress Notes (Signed)
Generic IM Makena ordered from [facility].  Order placed with Nedra HaiLee.

## 2018-03-29 NOTE — Progress Notes (Signed)
  Subjective:    Melinda Richardson is being seen today for her first obstetrical visit.  This is not a planned pregnancy.She would like to have an abortion but says that she doesn't have enough money to pay for it.  She is at 9427w0d gestation. Her obstetrical history is significant for obesity and PPROM at 24 weeks with Classical c/s and fetal demise. She had prometrium with last pregnancy and went to [redacted] weeks EGA when she had a LTCS. . Relationship with FOB: significant other, same FOB, lives together.  Patient does not intend to breast feed. Pregnancy history fully reviewed.  Patient reports no complaints.  Review of Systems:   Review of Systems  Objective:     LMP 12/27/2017  Physical Exam  Exam  Breathing, conversing, and ambulating normally Well nourished, well hydrated Black female, no apparent distress Heart- rrr Lungs- CTAB Abd- benign, morbidly obese Cervix- nulliparous, appears normal Discharge c/w BV   Assessment:    Pregnancy: [id number] Patient Active Problem List   Diagnosis Date Noted  . History of classical cesarean section 04/05/2017  . Supervision of high risk pregnancy, antepartum 01/10/2017  . History of preterm delivery, currently pregnant 01/10/2017  . ASCUS with positive high risk HPV cervical 01/10/2017  . Maternal varicella, non-immune 01/10/2017  . Preterm premature rupture of membranes (PPROM) with unknown onset of labor 12/11/2014  . Cervical incompetence during pregnancy in second trimester 12/09/2014       Plan:     Initial labs drawn. Prenatal vitamins. Problem list reviewed and updated. AFP3 discussed: requested. Role of ultrasound in pregnancy discussed; fetal survey: requested. Amniocentesis discussed: not indicated. Follow up in 3 weeks. Pap done today 17 P discussed and rec'd Treat BV with flagyl Nutrition consult ordered Come back 3 weeks for AFP and 17P HBA1C today rec'd 10 pound weight gain  Melinda Richardson 03/29/2018

## 2018-03-30 LAB — COMPREHENSIVE METABOLIC PANEL
ALK PHOS: 52 IU/L (ref 39–117)
ALT: 18 IU/L (ref 0–32)
AST: 13 IU/L (ref 0–40)
Albumin/Globulin Ratio: 1.2 (ref 1.2–2.2)
Albumin: 3.8 g/dL (ref 3.5–5.5)
BUN/Creatinine Ratio: 16 (ref 9–23)
BUN: 9 mg/dL (ref 6–20)
CHLORIDE: 101 mmol/L (ref 96–106)
CO2: 21 mmol/L (ref 20–29)
CREATININE: 0.57 mg/dL (ref 0.57–1.00)
Calcium: 9.2 mg/dL (ref 8.7–10.2)
GFR calc Af Amer: 151 mL/min/{1.73_m2} (ref >59)
GFR calc non Af Amer: 131 mL/min/{1.73_m2} (ref >59)
GLUCOSE: 87 mg/dL (ref 65–99)
Globulin, Total: 3.1 g/dL (ref 1.5–4.5)
Potassium: 4.2 mmol/L (ref 3.5–5.2)
Sodium: 138 mmol/L (ref 134–144)
TOTAL PROTEIN: 6.9 g/dL (ref 6.0–8.5)

## 2018-03-30 LAB — PROTEIN / CREATININE RATIO, URINE
Creatinine, Urine: 115.5 mg/dL
PROTEIN/CREAT RATIO: 94 mg/g{creat} (ref 0–200)
Protein, Ur: 10.8 mg/dL

## 2018-03-30 LAB — HEMOGLOBIN A1C
ESTIMATED AVERAGE GLUCOSE: 126 mg/dL
Hgb A1c MFr Bld: 6 % — ABNORMAL HIGH (ref 4.8–5.6)

## 2018-03-31 LAB — CULTURE, OB URINE

## 2018-03-31 LAB — URINE CULTURE, OB REFLEX: ORGANISM ID, BACTERIA: NO GROWTH

## 2018-04-01 ENCOUNTER — Telehealth: Payer: Self-pay | Admitting: *Deleted

## 2018-04-01 NOTE — Telephone Encounter (Signed)
Returned [employer]'s call from [facility] regarding pt's 17p.  Pt's insurance will not cover the prescription.  She needs to switch to pregnancy medicaid.   Called pt to ask if she was planning to switch to pregnancy Medicaid.  Pt did not pick up.  Message left requesting the pt call back to answer some questions.

## 2018-04-01 NOTE — Telephone Encounter (Signed)
[facility] [place]. He requested to speak with Morrie SheldonAshley regarding pt's prescription. Call route to [place].

## 2018-04-04 LAB — CYTOLOGY - PAP
Chlamydia: NEGATIVE
DIAGNOSIS: NEGATIVE
HPV 16/18/45 GENOTYPING: NEGATIVE
HPV: DETECTED — AB
NEISSERIA GONORRHEA: NEGATIVE

## 2018-04-04 NOTE — Telephone Encounter (Signed)
Called pt to discuss Makena.  Pt has 17p appointment set up beginning tomorrow but will only be 14 weeks tomorrow, so will not need to come to these appointment.  Pt aware that this appointment will be cancelled.  Requested pt come to the office, today or tomorrow to sign Makena paperwork.  Also advised pt that she contact her Medicaid case worker to inform her that she needs to change from Family Planning to Pregnancy Medicaid.  Pt verbalized understanding.

## 2018-04-05 ENCOUNTER — Ambulatory Visit: Payer: Medicaid Other

## 2018-04-09 ENCOUNTER — Telehealth: Payer: Self-pay

## 2018-04-09 LAB — OBSTETRIC PANEL, INCLUDING HIV
BASOS ABS: 0 10*3/uL (ref 0.0–0.2)
Basos: 0 %
EOS (ABSOLUTE): 0.1 10*3/uL (ref 0.0–0.4)
EOS: 2 %
HEMOGLOBIN: 9.6 g/dL — AB (ref 11.1–15.9)
HEP B S AG: NEGATIVE
HIV Screen 4th Generation wRfx: NONREACTIVE
Hematocrit: 29.8 % — ABNORMAL LOW (ref 34.0–46.6)
IMMATURE GRANS (ABS): 0 10*3/uL (ref 0.0–0.1)
IMMATURE GRANULOCYTES: 0 %
LYMPHS: 37 %
Lymphocytes Absolute: 1.8 10*3/uL (ref 0.7–3.1)
MCH: 27 pg (ref 26.6–33.0)
MCHC: 32.2 g/dL (ref 31.5–35.7)
MCV: 84 fL (ref 79–97)
MONOCYTES: 7 %
Monocytes Absolute: 0.3 10*3/uL (ref 0.1–0.9)
NEUTROS PCT: 54 %
Neutrophils Absolute: 2.7 10*3/uL (ref 1.4–7.0)
PLATELETS: 277 10*3/uL (ref 150–450)
RBC: 3.56 x10E6/uL — ABNORMAL LOW (ref 3.77–5.28)
RDW: 13.8 % (ref 12.3–15.4)
RPR: NONREACTIVE
RUBELLA: 3.14 {index} (ref >0.99)
Rh Factor: POSITIVE
WBC: 4.9 10*3/uL (ref 3.4–10.8)

## 2018-04-09 LAB — SMN1 COPY NUMBER ANALYSIS (SMA CARRIER SCREENING)

## 2018-04-09 LAB — AB SCR+ANTIBODY ID

## 2018-04-09 NOTE — Telephone Encounter (Signed)
Rcvd call from [place] from [place], wanted to check on the status of Pts Medicaid changing to Pregnancy Medicaid.Advised still waiting for pt to come in & sign forms & change but still has not showed.Richardson DoppCole states will stay in Contact.

## 2018-04-11 ENCOUNTER — Other Ambulatory Visit: Payer: Medicaid Other

## 2018-04-11 ENCOUNTER — Other Ambulatory Visit: Payer: Self-pay | Admitting: *Deleted

## 2018-04-11 DIAGNOSIS — O0990 Supervision of high risk pregnancy, unspecified, unspecified trimester: Secondary | ICD-10-CM

## 2018-04-12 ENCOUNTER — Other Ambulatory Visit: Payer: Medicaid Other

## 2018-04-12 ENCOUNTER — Ambulatory Visit: Payer: Medicaid Other

## 2018-04-18 ENCOUNTER — Ambulatory Visit: Payer: Medicaid Other | Admitting: Registered"

## 2018-04-19 ENCOUNTER — Encounter: Payer: Medicaid Other | Admitting: Obstetrics and Gynecology

## 2018-04-23 IMAGING — CR DG CHEST 2V
2 series · 2 of 2 positions shown · non-contrast
Comparison: None.

CLINICAL DATA: Productive cough for 1 week.

EXAM:
CHEST  2 VIEW

[chest pa]
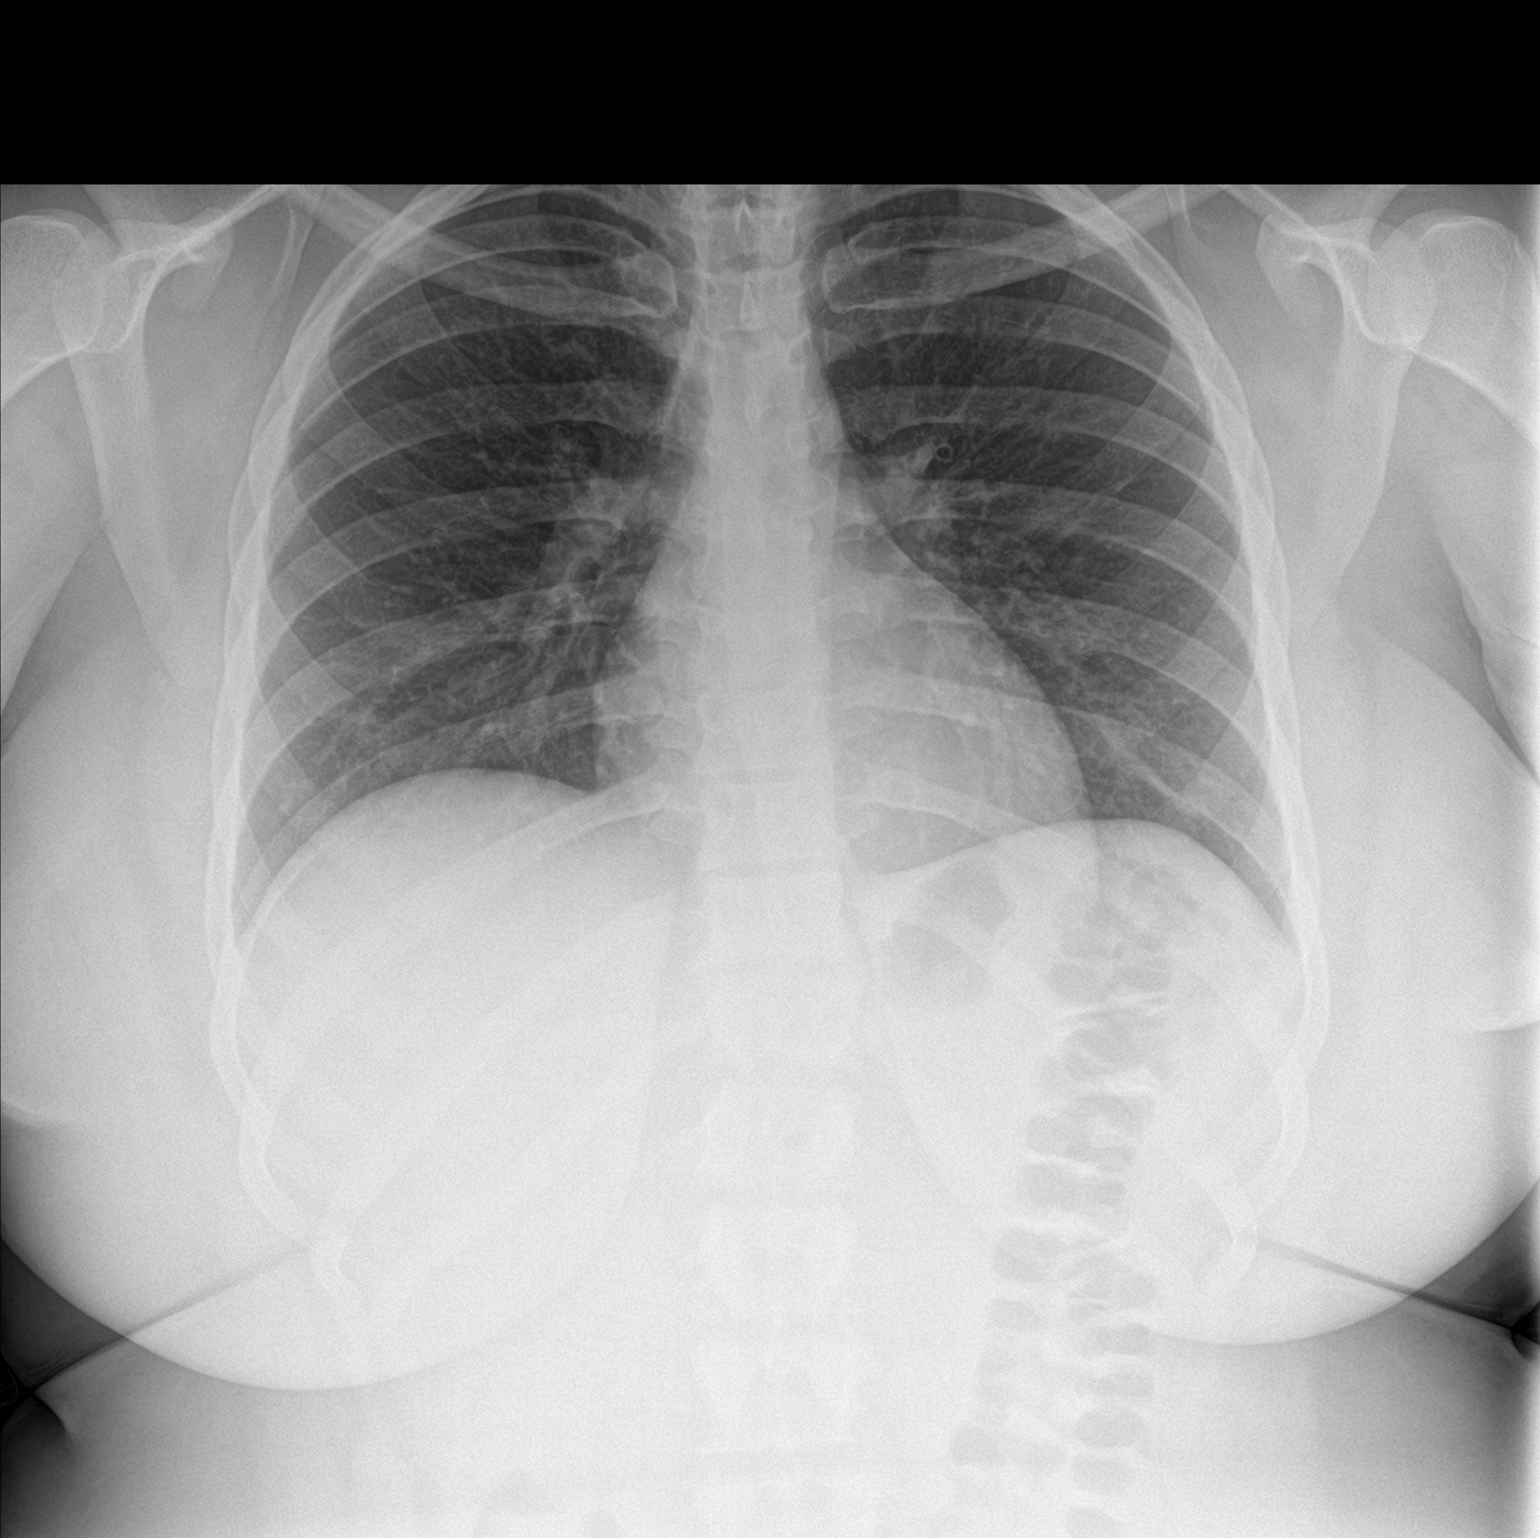

[chest lat]
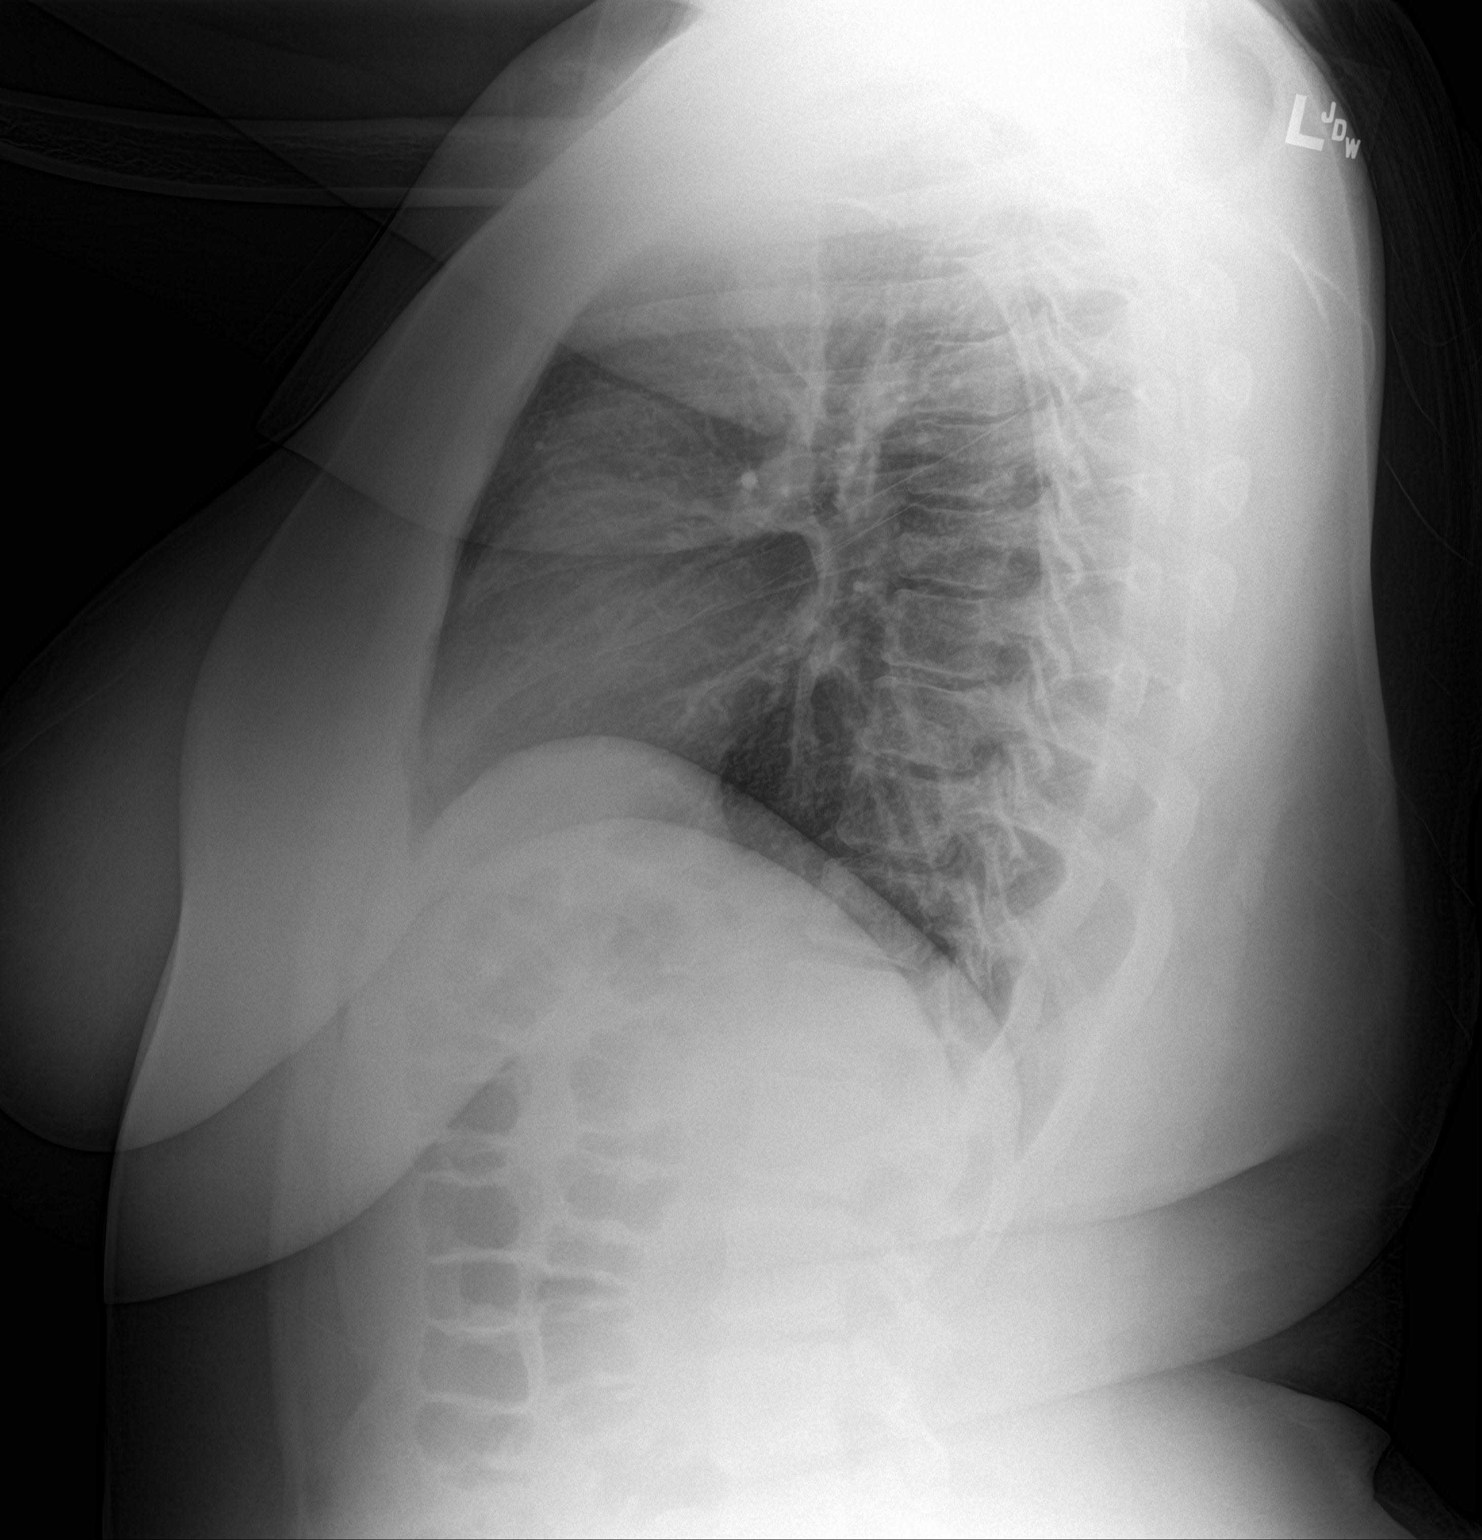

[2 of 2 positions shown; findings below may reference images not displayed]

FINDINGS: The cardiomediastinal silhouette is unremarkable.

There is no evidence of focal airspace disease, pulmonary edema,
suspicious pulmonary nodule/mass, pleural effusion, or pneumothorax.
No acute bony abnormalities are identified.
IMPRESSION: No active cardiopulmonary disease.

## 2018-05-02 ENCOUNTER — Ambulatory Visit (INDEPENDENT_AMBULATORY_CARE_PROVIDER_SITE_OTHER): Payer: Self-pay | Admitting: Obstetrics and Gynecology

## 2018-05-02 ENCOUNTER — Other Ambulatory Visit: Payer: Medicaid Other

## 2018-05-02 ENCOUNTER — Encounter: Payer: Self-pay | Admitting: Obstetrics and Gynecology

## 2018-05-02 VITALS — BP 122/78 | HR 88 | Wt 275.0 lb

## 2018-05-02 DIAGNOSIS — Z6841 Body Mass Index (BMI) 40.0 and over, adult: Secondary | ICD-10-CM | POA: Insufficient documentation

## 2018-05-02 DIAGNOSIS — Z98891 History of uterine scar from previous surgery: Secondary | ICD-10-CM

## 2018-05-02 DIAGNOSIS — O0990 Supervision of high risk pregnancy, unspecified, unspecified trimester: Secondary | ICD-10-CM

## 2018-05-02 DIAGNOSIS — O99019 Anemia complicating pregnancy, unspecified trimester: Secondary | ICD-10-CM | POA: Insufficient documentation

## 2018-05-02 DIAGNOSIS — Z23 Encounter for immunization: Secondary | ICD-10-CM

## 2018-05-02 DIAGNOSIS — O09212 Supervision of pregnancy with history of pre-term labor, second trimester: Secondary | ICD-10-CM

## 2018-05-02 DIAGNOSIS — O99210 Obesity complicating pregnancy, unspecified trimester: Secondary | ICD-10-CM

## 2018-05-02 DIAGNOSIS — O09899 Supervision of other high risk pregnancies, unspecified trimester: Secondary | ICD-10-CM

## 2018-05-02 DIAGNOSIS — O09219 Supervision of pregnancy with history of pre-term labor, unspecified trimester: Secondary | ICD-10-CM

## 2018-05-02 DIAGNOSIS — O3432 Maternal care for cervical incompetence, second trimester: Secondary | ICD-10-CM

## 2018-05-02 DIAGNOSIS — O0992 Supervision of high risk pregnancy, unspecified, second trimester: Secondary | ICD-10-CM

## 2018-05-02 DIAGNOSIS — O99810 Abnormal glucose complicating pregnancy: Secondary | ICD-10-CM

## 2018-05-02 DIAGNOSIS — O99012 Anemia complicating pregnancy, second trimester: Secondary | ICD-10-CM

## 2018-05-02 MED ORDER — FERROUS GLUCONATE 324 (38 FE) MG PO TABS: 324.0000 mg | ORAL_TABLET | Freq: Every day | ORAL | 4 refills | Status: DC

## 2018-05-02 MED ORDER — PROGESTERONE MICRONIZED 200 MG PO CAPS: ORAL_CAPSULE | ORAL | 1 refills | Status: DC

## 2018-05-02 MED ORDER — PRENATAL PLUS 27-1 MG PO TABS: 1.0000 | ORAL_TABLET | Freq: Every day | ORAL | 0 refills | Status: DC

## 2018-05-02 NOTE — Patient Instructions (Signed)
Influenza (Flu) Vaccine (Inactivated or Recombinant): What You Need to Know  1. Why get vaccinated?  Influenza vaccine can prevent influenza (flu).  Flu is a contagious disease that spreads around the [place] every year, usually between October and May. Anyone can get the flu, but it is more dangerous for some people. Infants and young children, people 24 years of age and older, pregnant women, and people with certain health conditions or a weakened immune system are at greatest risk of flu complications.  Pneumonia, bronchitis, sinus infections and ear infections are examples of flu-related complications. If you have a medical condition, such as heart disease, cancer or diabetes, flu can make it worse.  Flu can cause fever and chills, sore throat, muscle aches, fatigue, cough, headache, and runny or stuffy nose. Some people may have vomiting and diarrhea, though this is more common in children than adults.  Each year thousands of people in the [place] die from flu, and many more are hospitalized. Flu vaccine prevents millions of illnesses and flu-related visits to the doctor each year.  2. Influenza vaccine  CDC recommends everyone 6 months of age and older get vaccinated every flu season. Children 6 months through 8 years of age may need 2 doses during a single flu season. Everyone else needs only 1 dose each flu season.  It takes about 2 weeks for protection to develop after vaccination.  There are many flu viruses, and they are always changing. Each year a new flu vaccine is made to protect against three or four viruses that are likely to cause disease in the upcoming flu season. Even when the vaccine doesn't exactly match these viruses, it may still provide some protection.  Influenza vaccine does not cause flu.  Influenza vaccine may be given at the same time as other vaccines.  3. Talk with your health care provider  Tell your vaccine provider if the person getting the vaccine:  · Has had an  allergic reaction after a previous dose of influenza vaccine, or has any severe, life-threatening allergies.  · Has ever had Guillain-Barré Syndrome (also called GBS).  In some cases, your health care provider may decide to postpone influenza vaccination to a future visit.  People with minor illnesses, such as a cold, may be vaccinated. People who are moderately or severely ill should usually wait until they recover before getting influenza vaccine.  Your health care provider can give you more information.  4. Risks of a vaccine reaction  · Soreness, redness, and swelling where shot is given, fever, muscle aches, and headache can happen after influenza vaccine.  · There may be a very small increased risk of Guillain-Barré Syndrome (GBS) after inactivated influenza vaccine (the flu shot).  Young children who get the flu shot along with pneumococcal vaccine (PCV13), and/or DTaP vaccine at the same time might be slightly more likely to have a seizure caused by fever. Tell your health care provider if a child who is getting flu vaccine has ever had a seizure.  People sometimes faint after medical procedures, including vaccination. Tell your provider if you feel dizzy or have vision changes or ringing in the ears.  As with any medicine, there is a very remote chance of a vaccine causing a severe allergic reaction, other serious injury, or death.  5. What if there is a serious problem?  An allergic reaction could occur after the vaccinated person leaves the clinic. If you see signs of a severe allergic reaction (hives, swelling   of the face and throat, difficulty breathing, a fast heartbeat, dizziness, or weakness), call 9-1-1 and get the person to the nearest hospital.  For other signs that concern you, call your health care provider.  Adverse reactions should be reported to the Vaccine Adverse Event Reporting System (VAERS). Your health care provider will usually file this report, or you can do it yourself. Visit the  VAERS website at www.vaers.hhs.gov or call 1-800-822-7967.VAERS is only for reporting reactions, and VAERS staff do not give medical advice.  6. The National Vaccine Injury Compensation Program  The National Vaccine Injury Compensation Program (VICP) is a federal program that was created to compensate people who may have been injured by certain vaccines. Visit the VICP website at www.hrsa.gov/vaccinecompensation or call 1-800-338-2382 to learn about the program and about filing a claim. There is a time limit to file a claim for compensation.  7. How can I learn more?  · Ask your healthcare provider.  · Call your local or state health department.  · Contact the Centers for Disease Control and Prevention (CDC):  ? Call 1-800-232-4636 (1-800-CDC-INFO) or  ? Visit CDC's www.cdc.gov/flu  Vaccine Information Statement (Interim) Inactivated Influenza Vaccine (11/22/2017)  This information is not intended to replace advice given to you by your health care provider. Make sure you discuss any questions you have with your health care provider.  Document Released: 01/19/2006 Document Revised: 11/26/2017 Document Reviewed: 11/26/2017  Elsevier Interactive Patient Education © 2019 Elsevier Inc.

## 2018-05-02 NOTE — Progress Notes (Signed)
Patient states she hasn't contacted her case worker yet regarding changing her medicaid from [facility]. Encouraged patient to do so today if possible so we can send off for [facility]

## 2018-05-02 NOTE — Progress Notes (Signed)
Prenatal Visit Note Date: 05/02/2018 Clinic: [facility]  Subjective:  Melinda Richardson is a 23 y.o. G3P1101 at [redacted]w[redacted]d being seen today for ongoing prenatal care.  She is currently monitored for the following issues for this high-risk pregnancy and has Supervision of high risk pregnancy, antepartum; History of preterm delivery, currently pregnant; ASCUS with positive high risk HPV cervical; Maternal varicella, non-immune; and History of classical cesarean section on their problem list.  Patient reports no complaints.   Contractions: Not present. Vag. Bleeding: None.  Movement: Absent. Denies leaking of fluid.   The following portions of the patient's history were reviewed and updated as appropriate: allergies, current medications, past family history, past medical history, past social history, past surgical history and problem list. Problem list updated.  Objective:   Vitals:   05/02/18 0948  BP: 122/78  Pulse: 88  Weight: 275 lb (124.7 kg)    Fetal Status: Fetal Heart Rate (bpm): 156   Movement: Absent     General:  Alert, oriented and cooperative. Patient is in no acute distress.  Skin: Skin is warm and dry. No rash noted.   Cardiovascular: Normal heart rate noted  Respiratory: Normal respiratory effort, no problems with respiration noted  Abdomen: Soft, gravid, appropriate for gestational age. Pain/Pressure: Present     Pelvic:  Cervical exam deferred        Extremities: Normal range of motion.  Edema: None  Mental Status: Normal mood and affect. Normal behavior. Normal judgment and thought content.   Urinalysis:      Assessment and Plan:  Pregnancy: G3P1101 at [redacted]w[redacted]d  1. History of PTB 12/2014 PTB. Pt presented with abdominal pain w/ no PNC and was 3-4cm dilated and eventually had c/s for PTL; clinical situation doesn't seem c/w cx incompetence, so I don't feel cerclage is indicated. Pt had late [facility] with last pregnancy and went to term with just vag  progesterone. Pt hasn't switched over her medicaid from [facility] to pregnancy yet to get 17p. Pt stressed to call. Pt amenable to starting vaginal progesterone.   2. Supervision of high risk pregnancy, antepartum Declines genetics. Set up for anatomy u/s next week. Iron sent in - prenatal vitamin w/FE, FA (PRENATAL 1 + 1) 27-1 MG TABS tablet; Take 1 tablet by mouth daily at 12 noon.  Dispense: 30 each; Refill: 0 - Flu Vaccine QUAD 36+ mos IM  3. History of classical cesarean section Needs rpt at 37wks. Ask about btl nv   4. History of preterm delivery, currently pregnant See above.   5. Abnormal glucose Getting 2hr today for slightly high a1c  Preterm labor symptoms and general obstetric precautions including but not limited to vaginal bleeding, contractions, leaking of fluid and fetal movement were reviewed in detail with the patient. Please refer to After Visit Summary for other counseling recommendations.  Return in about 2 weeks (around 05/16/2018) for hrob.   [place] Bing, MD

## 2018-05-03 ENCOUNTER — Encounter (HOSPITAL_COMMUNITY): Payer: Self-pay

## 2018-05-03 LAB — GLUCOSE TOLERANCE, 2 HOURS W/ 1HR
GLUCOSE, 1 HOUR: 144 mg/dL (ref 65–179)
GLUCOSE, 2 HOUR: 120 mg/dL (ref 65–152)
Glucose, Fasting: 85 mg/dL (ref 65–91)

## 2018-05-09 ENCOUNTER — Encounter (HOSPITAL_COMMUNITY): Payer: Self-pay

## 2018-05-09 ENCOUNTER — Ambulatory Visit (HOSPITAL_COMMUNITY)
Admission: RE | Admit: 2018-05-09 | Discharge: 2018-05-09 | Disposition: A | Discharge status: Home / Self Care | Payer: Self-pay | Source: Ambulatory Visit | Attending: Obstetrics & Gynecology | Admitting: Obstetrics & Gynecology

## 2018-05-09 DIAGNOSIS — O34219 Maternal care for unspecified type scar from previous cesarean delivery: Secondary | ICD-10-CM

## 2018-05-09 DIAGNOSIS — O09212 Supervision of pregnancy with history of pre-term labor, second trimester: Secondary | ICD-10-CM

## 2018-05-09 DIAGNOSIS — Z3A19 19 weeks gestation of pregnancy: Secondary | ICD-10-CM

## 2018-05-09 DIAGNOSIS — O99212 Obesity complicating pregnancy, second trimester: Secondary | ICD-10-CM

## 2018-05-09 DIAGNOSIS — O0990 Supervision of high risk pregnancy, unspecified, unspecified trimester: Secondary | ICD-10-CM | POA: Insufficient documentation

## 2018-05-09 DIAGNOSIS — Z363 Encounter for antenatal screening for malformations: Secondary | ICD-10-CM

## 2018-05-10 ENCOUNTER — Other Ambulatory Visit (HOSPITAL_COMMUNITY): Payer: Self-pay | Admitting: *Deleted

## 2018-05-10 DIAGNOSIS — Z362 Encounter for other antenatal screening follow-up: Secondary | ICD-10-CM

## 2018-05-16 ENCOUNTER — Encounter: Payer: Medicaid Other | Admitting: Obstetrics and Gynecology

## 2018-05-16 ENCOUNTER — Encounter: Payer: Self-pay | Admitting: Obstetrics and Gynecology

## 2018-05-16 ENCOUNTER — Telehealth: Payer: Self-pay | Admitting: Family Medicine

## 2018-05-16 NOTE — Telephone Encounter (Signed)
Attempted to call pt to get her rescheduled for her missed OB appt. No answer, LVM to give the office a call back to get rescheduled. No-show letter mailed

## 2018-05-23 ENCOUNTER — Encounter: Payer: Medicaid Other | Admitting: Obstetrics & Gynecology

## 2018-05-23 ENCOUNTER — Telehealth: Payer: Self-pay | Admitting: Obstetrics & Gynecology

## 2018-05-23 IMAGING — CR DG KNEE COMPLETE 4+V*L*
5 series · 5 of 5 positions shown · non-contrast
Comparison: None.

CLINICAL DATA: Left knee pain for 1 year, fell last night with
increased pain

EXAM:
LEFT KNEE - COMPLETE 4+ VIEW

[t knee ap left]
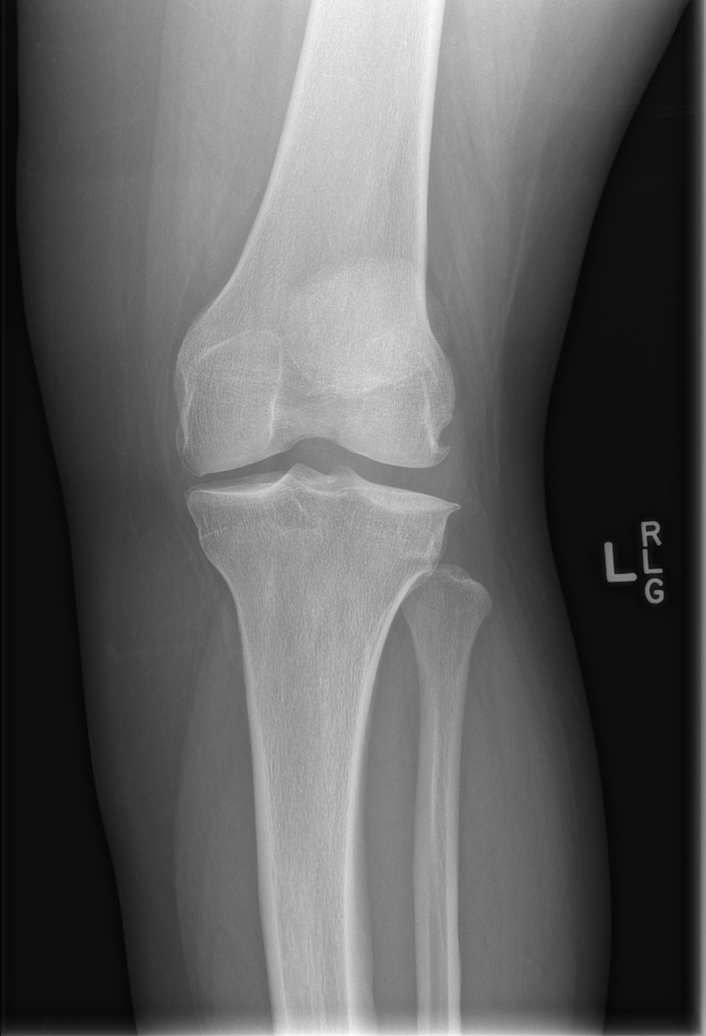

[t knee obl left (1 of 2)]
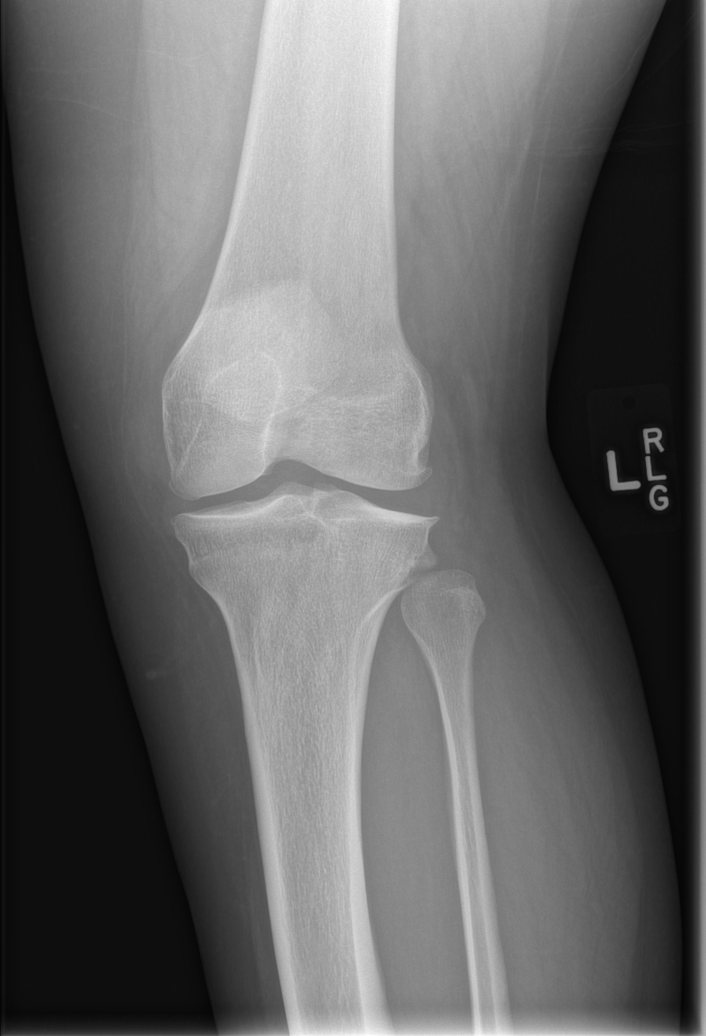

[t knee obl left (2 of 2)]
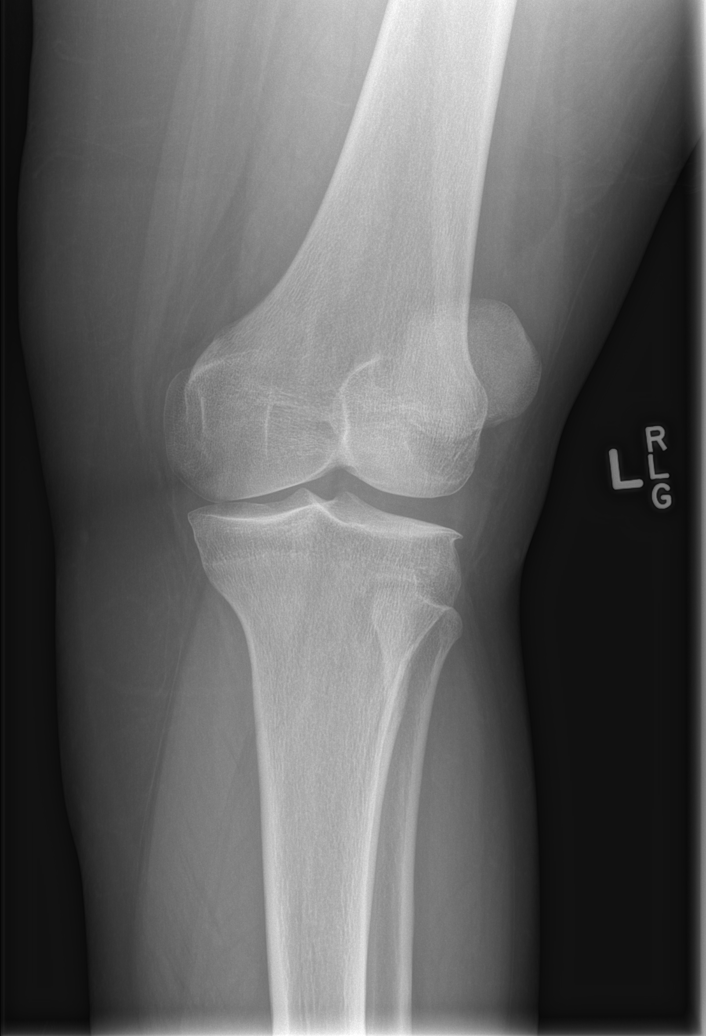

[t knee lat left (1 of 2)]
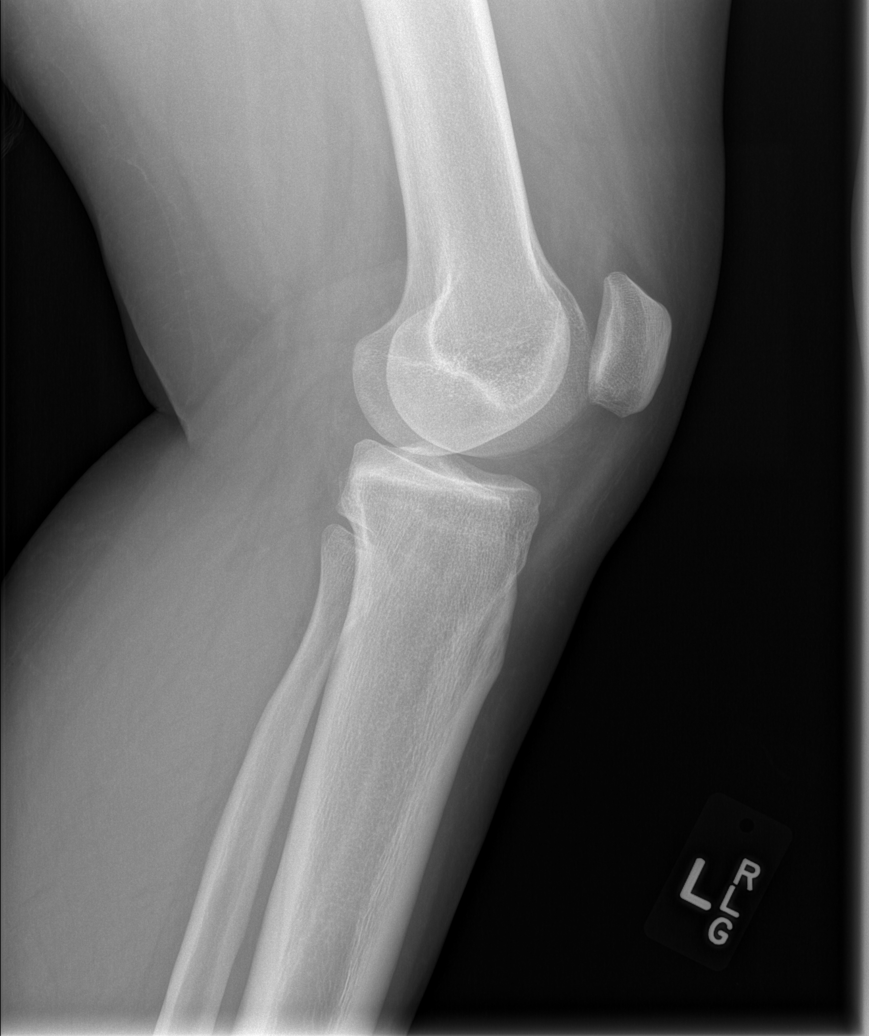

[t knee lat left (2 of 2)]
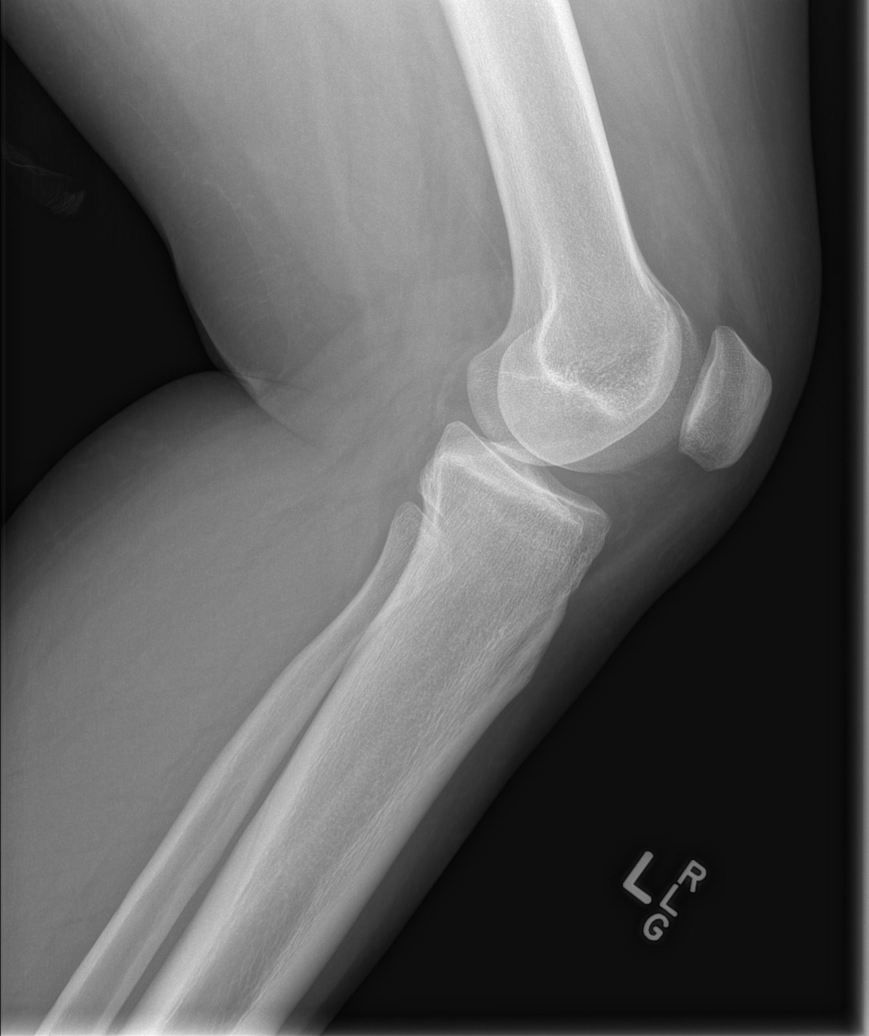

[5 of 5 positions shown; findings below may reference images not displayed]

FINDINGS: For age there is advanced bicompartmental degenerative joint disease
involving both the medial and lateral compartments. There is
spurring from the lateral compartment with some loss of medial
compartment joint space. The patellofemoral compartment appears
within normal limits. No fracture seen and no joint effusion is
noted.
IMPRESSION: Age advanced bicompartmental degenerative joint disease involving
medial and lateral compartments.

## 2018-05-23 NOTE — Telephone Encounter (Signed)
Left a VM for patient to call office.

## 2018-05-25 ENCOUNTER — Ambulatory Visit (HOSPITAL_COMMUNITY)
Admission: EM | Admit: 2018-05-25 | Discharge: 2018-05-25 | Disposition: A | Discharge status: Home / Self Care | Payer: Worker's Compensation | Attending: Family Medicine | Admitting: Family Medicine

## 2018-05-25 ENCOUNTER — Encounter (HOSPITAL_COMMUNITY): Payer: Self-pay | Admitting: Emergency Medicine

## 2018-05-25 DIAGNOSIS — M25522 Pain in left elbow: Secondary | ICD-10-CM | POA: Diagnosis not present

## 2018-05-25 NOTE — ED Provider Notes (Signed)
[facility]   [id number] 05/25/18 Arrival Time: 1516  CC: left elbow pain  SUBJECTIVE: History from: patient. Blanka N Cicero is a left hand dominant 23 y.o. female that is [redacted] weeks pregnant, complains of left elbow pain for "awhile" that got worse yesterday while folding sheets.  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  Reports "a lot of" repetitive movements with her left arm including folding, scrubbing and cleaning.  Localizes the pain to the inside of left elbow.  Describes the pain as intermittent.  Has tried tylenol extra strength without relief.  Symptoms are made worse with internal rotation about the elbow.  Denies similar symptoms in the past.  Denies fever, chills, erythema, ecchymosis, effusion, weakness, numbness and tingling.      ROS: As per HPI.  Past Medical History:  Diagnosis Date  . Anemia   . Medical history non-contributory   . Preterm labor   . Preterm premature rupture of membranes (PPROM) with unknown onset of labor 12/11/2014   Past Surgical History:  Procedure Laterality Date  . CESAREAN SECTION N/A 12/17/2014   Procedure: CESAREAN SECTION;  Surgeon: [name], MD;  Location: [facility];  Service: Obstetrics;  Laterality: N/A;  . CESAREAN SECTION  12/17/14   PTL @ 24 wks - footling breech  . CESAREAN SECTION N/A 04/05/2017   Procedure: CESAREAN SECTION;  Surgeon: [name], DO;  [facility];  Service: Obstetrics;  Laterality: N/A;  . CESAREAN SECTION CLASSICAL  12/17/2014   at [redacted]w[redacted]d  . TONSILLECTOMY     No Known Allergies No current facility-administered medications on file prior to encounter.    No current outpatient medications on file prior to encounter.   Social History   Socioeconomic History  . Marital status: Single    Spouse name: Not on file  . Number of children: Not on file  . Years of education: Not on file  . Highest education level: Not on file  Occupational History  .  Not on file  Social Needs  . Financial resource strain: Not on file  . Food insecurity:    Worry: Not on file    Inability: Not on file  . Transportation needs:    Medical: Not on file    Non-medical: Not on file  Tobacco Use  . Smoking status: Never Smoker  . Smokeless tobacco: Never Used  Substance and Sexual Activity  . Alcohol use: No  . Drug use: No  . Sexual activity: Not Currently    Birth control/protection: None    Comment: desires Nexplanon  Lifestyle  . Physical activity:    Days per week: Not on file    Minutes per session: Not on file  . Stress: Not on file  Relationships  . Social connections:    Talks on phone: Not on file    Gets together: Not on file    Attends religious service: Not on file    Active member of club or organization: Not on file    Attends meetings of clubs or organizations: Not on file    Relationship status: Not on file  . Intimate partner violence:    Fear of current or ex partner: Not on file    Emotionally abused: Not on file    Physically abused: Not on file    Forced sexual activity: Not on file  Other Topics Concern  . Not on file  Social History Narrative   ** Merged  History Encounter **       Family History  Problem Relation Age of Onset  . Kidney failure Father   . Diabetes Mother     OBJECTIVE:  Vitals:   05/25/18 1542  BP: 138/68  Pulse: 90  Resp: 18  Temp: (!) 97.5 F (36.4 C)  TempSrc: Temporal  SpO2: 100%    General appearance: Alert; in no acute distress.  Head: NCAT Lungs: normal respiratory effort CV: Radial pulses 2+ bilaterally; cap refill <2 secx Musculoskeletal: Left elbow Inspection: Skin warm, dry, clear and intact without obvious erythema, effusion, or ecchymosis.  Palpation: mildly TTP over medial condyle ROM: FROM active and passive; supinates and pronates; elbow extension and flexion without difficulty Strength: 5/5 shld abduction, 5/5 shld adduction, 5/5 elbow flexion, 5/5 elbow  extension, 5/5 grip strength Skin: warm and dry Neurologic: Ambulates without difficulty; Sensation intact about the upper extremities Psychological: alert and cooperative; normal mood and affect  ASSESSMENT & PLAN:  1. Left elbow pain    Sling placed.  Take arm out of sling intermittently to avoid shoulder stiffness and neck pain Continue conservative management of rest, ice, and gentle stretches Continue with tylenol as needed for pain Work restrictions given until evaluated by occupational health Follow up with occupational health for further evaluation and management.  Instructed to follow up ASAP Return or go to the ER if you have any new or worsening symptoms (fever, chills, chest pain, redness, swelling, bruising numbness, tingling, etc...)   Reviewed expectations re: course of current medical issues. Questions answered. Outlined signs and symptoms indicating need for more acute intervention. Patient verbalized understanding. After Visit Summary given.    Rennis Harding, PA-C 05/25/18 1630

## 2018-05-25 NOTE — Discharge Instructions (Signed)
Sling placed.  Take arm out of sling intermittently to avoid shoulder stiffness and neck pain Continue conservative management of rest, ice, and gentle stretches Continue with tylenol as needed for pain Work restrictions given until evaluated by occupational health Follow up with occupational health for further evaluation and management.  Return or go to the ER if you have any new or worsening symptoms (fever, chills, chest pain, redness, swelling, bruising numbness, tingling, etc...)

## 2018-05-25 NOTE — ED Triage Notes (Signed)
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

## 2018-05-27 ENCOUNTER — Encounter: Payer: Medicaid Other | Admitting: Family Medicine

## 2018-06-05 ENCOUNTER — Encounter: Payer: Medicaid Other | Admitting: Obstetrics & Gynecology

## 2018-06-06 ENCOUNTER — Ambulatory Visit (HOSPITAL_COMMUNITY): Payer: Medicaid Other | Admitting: *Deleted

## 2018-06-06 ENCOUNTER — Ambulatory Visit (HOSPITAL_COMMUNITY)
Admission: RE | Admit: 2018-06-06 | Discharge: 2018-06-06 | Disposition: A | Discharge status: Home / Self Care | Payer: Medicaid Other | Source: Ambulatory Visit | Attending: Obstetrics & Gynecology | Admitting: Obstetrics & Gynecology

## 2018-06-06 ENCOUNTER — Encounter (HOSPITAL_COMMUNITY): Payer: Self-pay | Admitting: *Deleted

## 2018-06-06 VITALS — BP 132/68 | HR 98 | Wt 289.1 lb

## 2018-06-06 DIAGNOSIS — O34219 Maternal care for unspecified type scar from previous cesarean delivery: Secondary | ICD-10-CM | POA: Diagnosis not present

## 2018-06-06 DIAGNOSIS — Z362 Encounter for other antenatal screening follow-up: Secondary | ICD-10-CM

## 2018-06-06 DIAGNOSIS — O09219 Supervision of pregnancy with history of pre-term labor, unspecified trimester: Principal | ICD-10-CM

## 2018-06-06 DIAGNOSIS — O09212 Supervision of pregnancy with history of pre-term labor, second trimester: Secondary | ICD-10-CM | POA: Diagnosis not present

## 2018-06-06 DIAGNOSIS — Z3A23 23 weeks gestation of pregnancy: Secondary | ICD-10-CM

## 2018-06-06 DIAGNOSIS — O99212 Obesity complicating pregnancy, second trimester: Secondary | ICD-10-CM | POA: Diagnosis not present

## 2018-06-06 DIAGNOSIS — O09899 Supervision of other high risk pregnancies, unspecified trimester: Secondary | ICD-10-CM

## 2018-06-07 ENCOUNTER — Other Ambulatory Visit (HOSPITAL_COMMUNITY): Payer: Self-pay | Admitting: *Deleted

## 2018-06-07 DIAGNOSIS — Z362 Encounter for other antenatal screening follow-up: Secondary | ICD-10-CM

## 2018-06-09 ENCOUNTER — Encounter (HOSPITAL_COMMUNITY): Payer: Self-pay | Admitting: Emergency Medicine

## 2018-06-09 ENCOUNTER — Ambulatory Visit (HOSPITAL_COMMUNITY)
Admission: EM | Admit: 2018-06-09 | Discharge: 2018-06-09 | Disposition: A | Discharge status: Home / Self Care | Payer: Medicaid Other | Attending: Emergency Medicine | Admitting: Emergency Medicine

## 2018-06-09 DIAGNOSIS — J029 Acute pharyngitis, unspecified: Secondary | ICD-10-CM | POA: Insufficient documentation

## 2018-06-09 DIAGNOSIS — R590 Localized enlarged lymph nodes: Secondary | ICD-10-CM

## 2018-06-09 LAB — POCT RAPID STREP A: STREPTOCOCCUS, GROUP A SCREEN (DIRECT): NEGATIVE

## 2018-06-09 MED ORDER — LIDOCAINE VISCOUS HCL 2 % MT SOLN: 10.0000 mL | OROMUCOSAL | 0 refills | Status: AC | PRN

## 2018-06-09 NOTE — ED Triage Notes (Signed)
Pt c/o sore throat since yesterday. With cough.

## 2018-06-09 NOTE — ED Provider Notes (Signed)
[facility]    CSN: [id number] Arrival date & time: 06/09/18  1044     History   Chief Complaint Chief Complaint  Patient presents with  . Sore Throat    HPI Melinda GIANNE ABSHIER is a 23 y.o. female.   The history is provided by the patient.  Sore Throat  This is a new problem. The current episode started yesterday. The problem occurs constantly. The problem has been gradually worsening. Pertinent negatives include no chest pain, no abdominal pain, no headaches and no shortness of breath. The symptoms are aggravated by drinking and swallowing. Nothing relieves the symptoms. She has tried nothing for the symptoms.    Past Medical History:  Diagnosis Date  . Anemia   . Medical history non-contributory   . Preterm labor   . Preterm premature rupture of membranes (PPROM) with unknown onset of labor 12/11/2014    Patient Active Problem List   Diagnosis Date Noted  . BMI 45.0-49.9, adult (HCC) 05/02/2018  . Obesity in pregnancy with antepartum complication 05/02/2018  . Abnormal glucose affecting pregnancy 05/02/2018  . Anemia in pregnancy 05/02/2018  . History of classical cesarean section 04/05/2017  . Supervision of high risk pregnancy, antepartum 01/10/2017  . History of preterm delivery, currently pregnant 01/10/2017  . ASCUS with positive high risk HPV cervical 01/10/2017  . Maternal varicella, non-immune 01/10/2017    Past Surgical History:  Procedure Laterality Date  . CESAREAN SECTION N/A 12/17/2014   Procedure: CESAREAN SECTION;  Surgeon: [name], MD;  Location: [facility];  Service: Obstetrics;  Laterality: N/A;  . CESAREAN SECTION  12/17/14   PTL @ 24 wks - footling breech  . CESAREAN SECTION N/A 04/05/2017   Procedure: CESAREAN SECTION;  Surgeon: [name], DO;  Location: [facility];  Service: Obstetrics;  Laterality: N/A;  . CESAREAN SECTION CLASSICAL  12/17/2014   at [redacted]w[redacted]d  . TONSILLECTOMY      OB History    Gravida  3   Para  2     Term  1   Preterm  1   AB  0   Living  1     SAB  0   TAB  0   Ectopic  0   Multiple  0   Live Births  2            Home Medications    Prior to Admission medications   Medication Sig Start Date End Date Taking? Authorizing Provider  Ferrous Sulfate (IRON PO) Take by mouth.    [provider]  Prenatal Vit w/Fe-Methylfol-FA (PNV PO) Take by mouth.    [provider]  Progesterone 100 MG SUPP Place vaginally.    [provider]    Family History Family History  Problem Relation Age of Onset  . Kidney failure Father   . Diabetes Mother     Social History Social History   Tobacco Use  . Smoking status: Never Smoker  . Smokeless tobacco: Never Used  Substance Use Topics  . Alcohol use: No  . Drug use: No     Allergies   Patient has no known allergies.   Review of Systems Review of Systems  Constitutional: Negative for chills and fever.  HENT: Positive for sore throat. Negative for ear pain, rhinorrhea, sinus pressure, sinus pain, trouble swallowing and voice change. Nosebleeds: right nostril.   Eyes: Negative for pain and visual disturbance.  Respiratory: Negative for cough and shortness of breath.  Cardiovascular: Negative for chest pain and palpitations.  Gastrointestinal: Negative for abdominal pain and vomiting.  Genitourinary: Negative for dysuria and hematuria.  Musculoskeletal: Negative for arthralgias and back pain.  Skin: Negative for color change and rash.  Neurological: Negative for seizures, syncope and headaches.  All other systems reviewed and are negative.    Physical Exam Triage Vital Signs ED Triage Vitals  Enc Vitals Group     BP 06/09/18 1136 135/68     Pulse Rate 06/09/18 1135 100     Resp 06/09/18 1135 16     Temp 06/09/18 1135 (!) 97.5 F (36.4 C)     Temp src --      SpO2 06/09/18 1135 100 %     Weight --      Height --      Head Circumference --      Peak Flow --      Pain Score  06/09/18 1136 9     Pain Loc --      Pain Edu? --      Excl. in GC? --    No data found.  Updated Vital Signs BP 135/68   Pulse 100   Temp (!) 97.5 F (36.4 C)   Resp 16   LMP 12/27/2017 (Exact Date)   SpO2 100%   Visual Acuity Right Eye Distance:   Left Eye Distance:   Bilateral Distance:    Right Eye Near:   Left Eye Near:    Bilateral Near:     Physical Exam Constitutional:      Appearance: She is well-developed.  HENT:     Head: Normocephalic and atraumatic.     Right Ear: Tympanic membrane normal.     Left Ear: Tympanic membrane normal.     Nose: No congestion or rhinorrhea.     Mouth/Throat:     Mouth: Mucous membranes are moist.     Pharynx: Uvula midline. Posterior oropharyngeal erythema (mild) present. No pharyngeal swelling.     Tonsils: No tonsillar exudate.  Eyes:     Conjunctiva/sclera: Conjunctivae normal.  Neck:     Musculoskeletal: Normal range of motion.  Pulmonary:     Effort: No respiratory distress.     Breath sounds: No stridor.  Lymphadenopathy:     Cervical: Cervical adenopathy present.  Skin:    General: Skin is warm and dry.  Neurological:     General: No focal deficit present.     Mental Status: She is alert.  Psychiatric:        Mood and Affect: Mood normal.      UC Treatments / Results  Labs (all labs ordered are listed, but only abnormal results are displayed) Labs Reviewed - No data to display  EKG None  Radiology No results found.  Procedures Procedures (including critical care time)  Medications Ordered in UC Medications - No data to display  Initial Impression / Assessment and Plan / UC Course  I have reviewed the triage vital signs and the nursing notes.  Pertinent labs & imaging results that were available during my care of the patient were reviewed by me and considered in my medical decision making (see chart for details).     She is well-appearing.  No evidence of strep throat.  No evidence of deep  space abscess.  Symptomatic treatment is warranted in this case.  Return precautions were given. Final Clinical Impressions(s) / UC Diagnoses   Final diagnoses:  Pharyngitis, unspecified etiology   Discharge Instructions  None    ED Prescriptions    Medication Sig Dispense Auth. Provider   lidocaine (XYLOCAINE) 2 % solution Use as directed 10 mLs in the mouth or throat as needed for up to 7 doses for mouth pain. 100 mL Marshell Levan, MD     Controlled Substance Prescriptions [place] Controlled Substance Registry consulted? No   Marshell Levan, MD 06/09/18 1228

## 2018-06-11 ENCOUNTER — Other Ambulatory Visit: Payer: Self-pay

## 2018-06-11 ENCOUNTER — Ambulatory Visit (INDEPENDENT_AMBULATORY_CARE_PROVIDER_SITE_OTHER): Payer: Medicaid Other | Admitting: Family Medicine

## 2018-06-11 VITALS — BP 121/76 | HR 94 | Wt 287.2 lb

## 2018-06-11 DIAGNOSIS — Z3A23 23 weeks gestation of pregnancy: Secondary | ICD-10-CM

## 2018-06-11 DIAGNOSIS — O0990 Supervision of high risk pregnancy, unspecified, unspecified trimester: Secondary | ICD-10-CM

## 2018-06-11 DIAGNOSIS — O0992 Supervision of high risk pregnancy, unspecified, second trimester: Secondary | ICD-10-CM

## 2018-06-11 DIAGNOSIS — O99019 Anemia complicating pregnancy, unspecified trimester: Secondary | ICD-10-CM

## 2018-06-11 DIAGNOSIS — O99012 Anemia complicating pregnancy, second trimester: Secondary | ICD-10-CM

## 2018-06-11 LAB — CULTURE, GROUP A STREP (THRC)

## 2018-06-11 NOTE — Progress Notes (Signed)
Medicaid form completed-06/11/2018

## 2018-06-11 NOTE — Progress Notes (Signed)
   PRENATAL VISIT NOTE  Subjective:  Melinda Richardson is a 23 y.o. G3P1101 at [redacted]w[redacted]d being seen today for ongoing prenatal care.  She is currently monitored for the following issues for this high-risk pregnancy and has Supervision of high risk pregnancy, antepartum; History of preterm delivery, currently pregnant; ASCUS with positive high risk HPV cervical; Maternal varicella, non-immune; History of classical cesarean section; BMI 45.0-49.9, adult (HCC); Obesity in pregnancy with antepartum complication; Abnormal glucose affecting pregnancy; and Anemia in pregnancy on their problem list.  Patient reports no complaints.  Contractions: Not present. Vag. Bleeding: None.  Movement: Present. Denies leaking of fluid.   The following portions of the patient's history were reviewed and updated as appropriate: allergies, current medications, past family history, past medical history, past social history, past surgical history and problem list. Problem list updated.  Objective:   Vitals:   06/11/18 1128  BP: 121/76  Pulse: 94  Weight: 287 lb 3.2 oz (130.3 kg)    Fetal Status: Fetal Heart Rate (bpm): 169 Fundal Height: 25 cm Movement: Present     General:  Alert, oriented and cooperative. Patient is in no acute distress.  Skin: Skin is warm and dry. No rash noted.   Cardiovascular: Normal heart rate noted  Respiratory: Normal respiratory effort, no problems with respiration noted  Abdomen: Soft, gravid, appropriate for gestational age.  Pain/Pressure: Absent     Pelvic: Cervical exam deferred        Extremities: Normal range of motion.  Edema: None  Mental Status: Normal mood and affect. Normal behavior. Normal judgment and thought content.   Assessment and Plan:  Pregnancy: G3P1101 at [redacted]w[redacted]d  1. Supervision of high risk pregnancy, antepartum - doing well today - third trimester labs ordered and scheduled - CBC; Future - Glucose Tolerance, 2 Hours w/1 Hour; Future - HIV Antibody (routine  testing w rflx); Future - RPR; Future  2. Antepartum anemia - patient reports taking iron - discussed ways to increase iron in diet - no adverse affects of meds  - recheck CBC with 3rd trimester labs   Preterm labor symptoms and general obstetric precautions including but not limited to vaginal bleeding, contractions, leaking of fluid and fetal movement were reviewed in detail with the patient. Please refer to After Visit Summary for other counseling recommendations.  Return in about 4 weeks (around 07/09/2018) for HROB.  Future Appointments  Date Time Provider Department Center  07/04/2018  1:20 PM [facility] NURSE [facility]  07/04/2018  1:30 PM [facility] [place] [facility]  07/04/2018  3:55 PM Conan Bowens, MD [facility]    Gwenevere Abbot, MD 06/11/18 12:08 PM

## 2018-06-15 ENCOUNTER — Encounter (HOSPITAL_COMMUNITY): Payer: Self-pay | Admitting: Emergency Medicine

## 2018-06-15 ENCOUNTER — Other Ambulatory Visit: Payer: Self-pay

## 2018-06-15 ENCOUNTER — Emergency Department (HOSPITAL_COMMUNITY)
Admission: EM | Admit: 2018-06-15 | Discharge: 2018-06-15 | Disposition: A | Discharge status: Home / Self Care | Payer: Medicaid Other | Attending: Emergency Medicine | Admitting: Emergency Medicine

## 2018-06-15 DIAGNOSIS — B9789 Other viral agents as the cause of diseases classified elsewhere: Secondary | ICD-10-CM

## 2018-06-15 DIAGNOSIS — J069 Acute upper respiratory infection, unspecified: Secondary | ICD-10-CM

## 2018-06-15 DIAGNOSIS — R05 Cough: Secondary | ICD-10-CM | POA: Diagnosis present

## 2018-06-15 NOTE — ED Provider Notes (Signed)
[facility] EMERGENCY DEPARTMENT Provider Note   CSN: [id number] Arrival date & time: 06/15/18  5056   History   Chief Complaint Chief Complaint  Patient presents with  . Cough    HPI Melinda Richardson is a 23 y.o. female.     HPI   23 year old female presents today with complaints of respiratory infection.  Patient notes approximate 1 week ago she developed sore throat.  She notes the sore throat has improved but continues to have dry nonproductive cough.  She denies any shortness of breath, denies any fever.  She notes nasal congestion and rhinorrhea.  She was seen in urgent care and prescribed lidocaine viscous solution.  She notes she is approximately [redacted] weeks pregnant with no OB related complaints.  She notes her daughter at home is also having a cough.    Past Medical History:  Diagnosis Date  . Anemia   . Medical history non-contributory   . Preterm labor   . Preterm premature rupture of membranes (PPROM) with unknown onset of labor 12/11/2014    Patient Active Problem List   Diagnosis Date Noted  . BMI 45.0-49.9, adult (HCC) 05/02/2018  . Obesity in pregnancy with antepartum complication 05/02/2018  . Abnormal glucose affecting pregnancy 05/02/2018  . Anemia in pregnancy 05/02/2018  . History of classical cesarean section 04/05/2017  . Supervision of high risk pregnancy, antepartum 01/10/2017  . History of preterm delivery, currently pregnant 01/10/2017  . ASCUS with positive high risk HPV cervical 01/10/2017  . Maternal varicella, non-immune 01/10/2017    Past Surgical History:  Procedure Laterality Date  . CESAREAN SECTION N/A 12/17/2014   Procedure: CESAREAN SECTION;  Surgeon: [name], MD;  Location: [facility];  Service: Obstetrics;  Laterality: N/A;  . CESAREAN SECTION  12/17/14   PTL @ 24 wks - footling breech  . CESAREAN SECTION N/A 04/05/2017   Procedure: CESAREAN SECTION;  Surgeon: [name], DO;  Location: [facility] BIRTHING SUITES;   Service: Obstetrics;  Laterality: N/A;  . CESAREAN SECTION CLASSICAL  12/17/2014   at [redacted]w[redacted]d  . TONSILLECTOMY       OB History    Gravida  3   Para  2   Term  1   Preterm  1   AB  0   Living  1     SAB  0   TAB  0   Ectopic  0   Multiple  0   Live Births  2            Home Medications    Prior to Admission medications   Medication Sig Start Date End Date Taking? Authorizing Provider  Ferrous Sulfate (IRON PO) Take by mouth.    [provider]  lidocaine (XYLOCAINE) 2 % solution Use as directed 10 mLs in the mouth or throat as needed for up to 7 doses for mouth pain. 06/09/18 06/15/18  Marshell Levan, MD  Prenatal Vit w/Fe-Methylfol-FA (PNV PO) Take by mouth.    [provider]  Progesterone 100 MG SUPP Place vaginally.    [provider]    Family History Family History  Problem Relation Age of Onset  . Kidney failure Father   . Diabetes Mother     Social History Social History   Tobacco Use  . Smoking status: Never Smoker  . Smokeless tobacco: Never Used  Substance Use Topics  . Alcohol use: No  . Drug use: No     Allergies  Patient has no known allergies.   Review of Systems Review of Systems  All other systems reviewed and are negative.    Physical Exam Updated Vital Signs BP (!) 147/77 (BP Location: Right Arm)   Pulse 99   Temp 98 F (36.7 C) (Oral)   Resp 20   LMP 12/27/2017 (Exact Date)   SpO2 100%   Physical Exam Vitals signs and nursing note reviewed.  Constitutional:      Appearance: She is well-developed.  HENT:     Head: Normocephalic and atraumatic.     Comments: Bilateral TMs normal, oropharynx clear with no erythema edema or exudate-no cervical lymphadenopathy, no rhinorrhea noted Eyes:     General: No scleral icterus.       Right eye: No discharge.        Left eye: No discharge.     Conjunctiva/sclera: Conjunctivae normal.     Pupils: Pupils are equal, round, and reactive to light.    Neck:     Musculoskeletal: Normal range of motion.     Vascular: No JVD.     Trachea: No tracheal deviation.  Pulmonary:     Effort: Pulmonary effort is normal. No respiratory distress.     Breath sounds: Normal breath sounds. No stridor. No wheezing or rales.  Neurological:     Mental Status: She is alert and oriented to person, place, and time.     Coordination: Coordination normal.  Psychiatric:        Behavior: Behavior normal.        Thought Content: Thought content normal.        Judgment: Judgment normal.      ED Treatments / Results  Labs (all labs ordered are listed, but only abnormal results are displayed) Labs Reviewed - No data to display  EKG None  Radiology No results found.  Procedures Procedures (including critical care time)  Medications Ordered in ED Medications - No data to display   Initial Impression / Assessment and Plan / ED Course  I have reviewed the triage vital signs and the nursing notes.  Pertinent labs & imaging results that were available during my care of the patient were reviewed by me and considered in my medical decision making (see chart for details).        23 year old female presents today with complaints of respiratory infection.  She has no objective findings on my exam is very well-appearing no acute distress.  Very low suspicion for any bacterial etiology.  Patient discharged with outpatient follow-up and return precautions.  She verbalized understanding and agreement to today's plan.  Final Clinical Impressions(s) / ED Diagnoses   Final diagnoses:  Viral URI with cough    ED Discharge Orders    None       Eyvonne Mechanic, PA-C 06/15/18 0900    Linwood Dibbles, MD 06/16/18 1126

## 2018-06-15 NOTE — ED Notes (Signed)
Patient verbalized understanding of dc instructions, vss, ambulatory with nad.   

## 2018-06-15 NOTE — Discharge Instructions (Addendum)
Please read attached information. If you experience any new or worsening signs or symptoms please return to the emergency room for evaluation. Please follow-up with your primary care provider or specialist as discussed.  °

## 2018-06-15 NOTE — ED Triage Notes (Addendum)
Patient reports dry cough for a little over 1 week, she also endorses chills, sore throat and runny nose. Was seen at urgent care on 3/1 and given a medication but she reports no improvement in her symptoms.

## 2018-06-15 NOTE — ED Notes (Signed)
ED Provider at bedside. 

## 2018-07-04 ENCOUNTER — Ambulatory Visit (HOSPITAL_COMMUNITY): Payer: Medicaid Other

## 2018-07-04 ENCOUNTER — Encounter: Payer: Medicaid Other | Admitting: Obstetrics and Gynecology

## 2018-07-22 ENCOUNTER — Encounter: Payer: Medicaid Other | Admitting: Family Medicine

## 2018-07-24 ENCOUNTER — Ambulatory Visit (INDEPENDENT_AMBULATORY_CARE_PROVIDER_SITE_OTHER): Payer: Medicaid Other | Admitting: Obstetrics & Gynecology

## 2018-07-24 ENCOUNTER — Other Ambulatory Visit: Payer: Self-pay

## 2018-07-24 VITALS — BP 129/72 | HR 99 | Temp 98.2°F | Wt 295.6 lb

## 2018-07-24 DIAGNOSIS — O2603 Excessive weight gain in pregnancy, third trimester: Secondary | ICD-10-CM

## 2018-07-24 DIAGNOSIS — Z3A29 29 weeks gestation of pregnancy: Secondary | ICD-10-CM

## 2018-07-24 DIAGNOSIS — Z23 Encounter for immunization: Secondary | ICD-10-CM

## 2018-07-24 DIAGNOSIS — O2600 Excessive weight gain in pregnancy, unspecified trimester: Secondary | ICD-10-CM | POA: Insufficient documentation

## 2018-07-24 DIAGNOSIS — O99013 Anemia complicating pregnancy, third trimester: Secondary | ICD-10-CM

## 2018-07-24 DIAGNOSIS — O09219 Supervision of pregnancy with history of pre-term labor, unspecified trimester: Secondary | ICD-10-CM

## 2018-07-24 DIAGNOSIS — O09899 Supervision of other high risk pregnancies, unspecified trimester: Secondary | ICD-10-CM

## 2018-07-24 DIAGNOSIS — O99213 Obesity complicating pregnancy, third trimester: Secondary | ICD-10-CM

## 2018-07-24 DIAGNOSIS — O09213 Supervision of pregnancy with history of pre-term labor, third trimester: Secondary | ICD-10-CM

## 2018-07-24 DIAGNOSIS — O0990 Supervision of high risk pregnancy, unspecified, unspecified trimester: Secondary | ICD-10-CM

## 2018-07-24 DIAGNOSIS — O99210 Obesity complicating pregnancy, unspecified trimester: Secondary | ICD-10-CM

## 2018-07-24 DIAGNOSIS — O0993 Supervision of high risk pregnancy, unspecified, third trimester: Secondary | ICD-10-CM

## 2018-07-24 NOTE — Progress Notes (Signed)
See other note

## 2018-07-24 NOTE — Progress Notes (Signed)
   PRENATAL VISIT NOTE  Subjective:  Melinda Richardson is a 23 y.o. G3P1101 at [redacted]w[redacted]d being seen today for ongoing prenatal care.  She is currently monitored for the following issues for this high-risk pregnancy and has Supervision of high risk pregnancy, antepartum; History of preterm delivery, currently pregnant; ASCUS with positive high risk HPV cervical; Maternal varicella, non-immune; History of cesarean delivery; BMI 45.0-49.9, adult (HCC); Obesity in pregnancy with antepartum complication; Abnormal glucose affecting pregnancy; Anemia in pregnancy; and Excessive weight gain affecting pregnancy on their problem list.  Patient reports no complaints.  Contractions: Not present. Vag. Bleeding: None.  Movement: Present. Denies leaking of fluid.   The following portions of the patient's history were reviewed and updated as appropriate: allergies, current medications, past family history, past medical history, past social history, past surgical history and problem list.   Objective:   Vitals:   07/24/18 1602  BP: 129/72  Pulse: 99  Temp: 98.2 F (36.8 C)  Weight: 295 lb 9.6 oz (134.1 kg)    Fetal Status: Fetal Heart Rate (bpm): 165   Movement: Present     General:  Alert, oriented and cooperative. Patient is in no acute distress.  Skin: Skin is warm and dry. No rash noted.   Cardiovascular: Normal heart rate noted  Respiratory: Normal respiratory effort, no problems with respiration noted  Abdomen: Soft, gravid, appropriate for gestational age.  Pain/Pressure: Present     Pelvic: Cervical exam deferred        Extremities: Normal range of motion.  Edema: None  Mental Status: Normal mood and affect. Normal behavior. Normal judgment and thought content.   Assessment and Plan:  Pregnancy: G3P1101 at [redacted]w[redacted]d 1. Supervision of high risk pregnancy, antepartum * - [facility] AMB BABYSCRIPTS SCHEDULE OPTIMIZATION - RPR - HIV Antibody (routine testing w rflx) - CBC   3. Anemia during pregnancy in  third trimester  4. Obesity in pregnancy with antepartum complication - Hemoglobin A1c - Referral to Nutrition and Diabetes Services - she will schedule a lab visit for a 2 hour GTT as she cancelled her last appt for this I stressed the importance of this test  5. History of preterm delivery, currently pregnant   6. Excessive weight gain affecting pregnancy  - Referral to Nutrition and Diabetes Services  Preterm labor symptoms and general obstetric precautions including but not limited to vaginal bleeding, contractions, leaking of fluid and fetal movement were reviewed in detail with the patient. Please refer to After Visit Summary for other counseling recommendations.   Return for 2 hour GTT wiht lab visit asap, 2 weeks televisit.  Future Appointments  Date Time Provider Department Center  08/08/2018 11:00 AM [facility] NURSE [facility]  08/08/2018 11:00 AM [facility] [place] [facility]    Allie Bossier, MD

## 2018-07-25 LAB — HEMOGLOBIN A1C
Est. average glucose Bld gHb Est-mCnc: 126 mg/dL
Hgb A1c MFr Bld: 6 % — ABNORMAL HIGH (ref 4.8–5.6)

## 2018-07-25 LAB — CBC
Hematocrit: 28.6 % — ABNORMAL LOW (ref 34.0–46.6)
Hemoglobin: 9.6 g/dL — ABNORMAL LOW (ref 11.1–15.9)
MCH: 27.7 pg (ref 26.6–33.0)
MCHC: 33.6 g/dL (ref 31.5–35.7)
MCV: 83 fL (ref 79–97)
Platelets: 254 10*3/uL (ref 150–450)
RBC: 3.46 x10E6/uL — ABNORMAL LOW (ref 3.77–5.28)
RDW: 13.1 % (ref 11.7–15.4)
WBC: 7.8 10*3/uL (ref 3.4–10.8)

## 2018-07-25 LAB — HIV ANTIBODY (ROUTINE TESTING W REFLEX): HIV Screen 4th Generation wRfx: NONREACTIVE

## 2018-07-25 LAB — RPR: RPR Ser Ql: NONREACTIVE

## 2018-07-29 ENCOUNTER — Other Ambulatory Visit: Payer: Medicaid Other

## 2018-07-29 ENCOUNTER — Other Ambulatory Visit: Payer: Self-pay

## 2018-07-29 DIAGNOSIS — O0990 Supervision of high risk pregnancy, unspecified, unspecified trimester: Secondary | ICD-10-CM

## 2018-08-06 ENCOUNTER — Ambulatory Visit: Payer: Medicaid Other | Admitting: Registered"

## 2018-08-08 ENCOUNTER — Ambulatory Visit (HOSPITAL_COMMUNITY): Payer: Medicaid Other | Admitting: *Deleted

## 2018-08-08 ENCOUNTER — Other Ambulatory Visit: Payer: Self-pay

## 2018-08-08 ENCOUNTER — Ambulatory Visit (INDEPENDENT_AMBULATORY_CARE_PROVIDER_SITE_OTHER): Payer: Self-pay | Admitting: Family Medicine

## 2018-08-08 ENCOUNTER — Encounter (HOSPITAL_COMMUNITY): Payer: Self-pay

## 2018-08-08 ENCOUNTER — Ambulatory Visit (HOSPITAL_COMMUNITY)
Admission: RE | Admit: 2018-08-08 | Discharge: 2018-08-08 | Disposition: A | Discharge status: Home / Self Care | Payer: Medicaid Other | Source: Ambulatory Visit | Attending: Maternal & Fetal Medicine | Admitting: Maternal & Fetal Medicine

## 2018-08-08 VITALS — Temp 98.5°F

## 2018-08-08 DIAGNOSIS — Z362 Encounter for other antenatal screening follow-up: Secondary | ICD-10-CM | POA: Diagnosis present

## 2018-08-08 DIAGNOSIS — O0990 Supervision of high risk pregnancy, unspecified, unspecified trimester: Secondary | ICD-10-CM

## 2018-08-08 DIAGNOSIS — O09213 Supervision of pregnancy with history of pre-term labor, third trimester: Secondary | ICD-10-CM

## 2018-08-08 DIAGNOSIS — Z98891 History of uterine scar from previous surgery: Secondary | ICD-10-CM

## 2018-08-08 DIAGNOSIS — O99810 Abnormal glucose complicating pregnancy: Secondary | ICD-10-CM

## 2018-08-08 DIAGNOSIS — O0993 Supervision of high risk pregnancy, unspecified, third trimester: Secondary | ICD-10-CM

## 2018-08-08 DIAGNOSIS — O09219 Supervision of pregnancy with history of pre-term labor, unspecified trimester: Secondary | ICD-10-CM

## 2018-08-08 DIAGNOSIS — O09899 Supervision of other high risk pregnancies, unspecified trimester: Secondary | ICD-10-CM

## 2018-08-08 DIAGNOSIS — Z3A32 32 weeks gestation of pregnancy: Secondary | ICD-10-CM

## 2018-08-08 NOTE — Progress Notes (Addendum)
   PRENATAL VISIT NOTE  Subjective:  Melinda Richardson is a 23 y.o. G3P1101 at [redacted]w[redacted]d being seen today for ongoing prenatal care.  She is currently monitored for the following issues for this high-risk pregnancy and has Supervision of high risk pregnancy, antepartum; History of preterm delivery, currently pregnant; ASCUS with positive high risk HPV cervical; Maternal varicella, non-immune; History of cesarean delivery; BMI 45.0-49.9, adult (HCC); Obesity in pregnancy with antepartum complication; Abnormal glucose affecting pregnancy; Anemia in pregnancy; and Excessive weight gain affecting pregnancy on their problem list.  Patient reports no complaints.  Contractions: Not present. Vag. Bleeding: None.  Movement: Present. Denies leaking of fluid.   The following portions of the patient's history were reviewed and updated as appropriate: allergies, current medications, past family history, past medical history, past social history, past surgical history and problem list.   Objective:  There were no vitals filed for this visit.  Fetal Status:     Movement: Present     General:  Alert, oriented and cooperative. Patient is in no acute distress.  Mental Status: Normal mood and affect. Normal behavior. Normal judgment and thought content.   Assessment and Plan:  Pregnancy: G3P1101 at [redacted]w[redacted]d 1. Supervision of high risk pregnancy, antepartum Has a lot of stress right now. Not sure if she wants to keep the baby. Will set up appointment with Marijean Niemann to help work through this.  2. History of preterm delivery, currently pregnant Not on medications  3. Abnormal glucose affecting pregnancy HgA1c 6.0. Normal 2hr GTT at 24 weeks  4. Prior classical c/s Needs repeat at 36-37 weeks  Preterm labor symptoms and general obstetric precautions including but not limited to vaginal bleeding, contractions, leaking of fluid and fetal movement were reviewed in detail with the patient. Please refer to After Visit  Summary for other counseling recommendations.   Return in about 3 weeks (around 08/29/2018) for HR OB f/u.  No future appointments.  [name], DO

## 2018-08-08 NOTE — Progress Notes (Signed)
I connected with  Averey N Witters on 08/08/18 at 1:40  by telephone and verified that I am speaking with the correct person using two identifiers.   I discussed the limitations, risks, security and privacy concerns of performing an evaluation and management service by telephone and the availability of in person appointments. I also discussed with the patient that there may be a patient responsible charge related to this service. The patient expressed understanding and agreed to proceed.  States has not gotten cuff yet; I explained we will call back when provider is ready for the virtual visit. She voices understanding and has [employer].  Linda,RN 08/08/2018  1:39 PM

## 2018-08-12 ENCOUNTER — Ambulatory Visit (INDEPENDENT_AMBULATORY_CARE_PROVIDER_SITE_OTHER): Payer: Self-pay | Admitting: Clinical

## 2018-08-12 ENCOUNTER — Other Ambulatory Visit: Payer: Self-pay | Admitting: Family Medicine

## 2018-08-12 DIAGNOSIS — F4323 Adjustment disorder with mixed anxiety and depressed mood: Secondary | ICD-10-CM

## 2018-08-12 NOTE — BH Specialist Note (Signed)
Integrated Behavioral Health Initial Visit  MRN: [id number] Name: Melinda Richardson  Number of Integrated Behavioral Health Clinician visits:: 1/6 Session Start time: 11:32  Session End time: 11:56 Total time: 20 minutes  Type of Service: Integrated Behavioral Health- Individual/Family Interpretor:No. Interpretor Name and Language: n/a   Warm Hand Off Completed.       SUBJECTIVE: Melinda Richardson is a 23 y.o. female accompanied by n/a Patient was referred by Candelaria Celeste, DO to work through indecision regarding adoption. Patient reports the following symptoms/concerns: Pt states her primary concern today is feeling uncertainty regarding whether or not she will give baby up for adoption; finds it helpful to talk through her feelings. Pt worries she will change her mind when she sees baby.  Duration of problem: Current pregnancy; Severity of problem: mild  OBJECTIVE: Mood: Normal and Affect: Appropriate Risk of harm to self or others: No plan to harm self or others  LIFE CONTEXT: Family and Social: Pt's boyfriend and mother are her greatest supports; they will support her decision School/Work: Pt on unemployment during COVID-19 pandemic Self-Care: - Life Changes: Current pregnancy; unemployment during pandemic  GOALS ADDRESSED: Patient will: 1. Maintain reduction of  symptoms of: anxiety and depression 2. Increase knowledge and/or ability of: healthy habits  3. Demonstrate ability to: Increase healthy adjustment to current life circumstances and Increase adequate support systems for patient/family  INTERVENTIONS: Interventions utilized: Solution-Focused Strategies, Psychoeducation and/or Health Education and Link to [employer]  Standardized Assessments completed: GAD-7 and PHQ 9  ASSESSMENT: Patient currently experiencing Adjustment disorder with mixed anxious and depressed mood.    Patient may benefit from psychoeducation and brief therapeutic interventions  regarding coping with current feelings regarding potential adoption, including mild anxiety and depression.  PLAN: 1. Follow up with behavioral health clinician on : As needed 2. Behavioral recommendations:  -Continue talking to boyfriend and mother daily; continue daily short walks outside (while practicing social distancing) -Contact local adoption agencies to find out more information about each one, this week 3. Referral(s): Integrated Art gallery manager (In Clinic) and [employer]:  adoption agencies 4. "From scale of 1-10, how likely are you to follow plan?": 8  Type of Visit: Webex video Patient location: Home [facility] Provider location: [facility] All persons participating in visit: Patient Melinda Richardson and [facility] Jamie McMannes  Confirmed patient's address: Yes  Confirmed patient's phone number: Yes  Any changes to demographics: No   Confirmed patient's insurance: Yes  Any changes to patient's insurance: No   Discussed confidentiality: Yes    The following statements were read to the patient and/or legal guardian that are established with the [facility] Provider.  "The purpose of this phone visit is to provide behavioral health care while limiting exposure to the coronavirus (COVID19).  There is a possibility of technology failure and discussed alternative modes of communication if that failure occurs."  "By engaging in this telephone visit, you consent to the provision of healthcare.  Additionally, you authorize for your insurance to be billed for the services provided during this telephone visit."   Patient and/or legal guardian consented to telephone visit: Yes    STRENGTHS (Protective Factors/Coping Skills): Supportive mother and boyfriend; positive outlook  Jamie C McMannes, LCSW

## 2018-08-28 ENCOUNTER — Encounter (HOSPITAL_COMMUNITY): Payer: Self-pay | Admitting: *Deleted

## 2018-08-28 NOTE — Patient Instructions (Signed)
CHIQUETA LOTTI  08/28/2018   Your procedure is scheduled on:  09/10/2018  Arrive at 1212 pm at Entrance C on [address] at [facility]  and [employer]. You are invited to use the FREE valet parking or use the Visitor's parking deck.  Pick up the phone at the desk and dial [phone].  Call this number if you have problems the morning of surgery: [phone]  Remember:   Do not eat food:(After Midnight) Desps de medianoche.  Do not drink clear liquids: (After Midnight) Desps de medianoche.  Take these medicines the morning of surgery with A SIP OF WATER:  none   Do not wear jewelry, make-up or nail polish.  Do not wear lotions, powders, or perfumes. Do not wear deodorant.  Do not shave 48 hours prior to surgery.  Do not bring valuables to the hospital.  [facility] is not   responsible for any belongings or valuables brought to the hospital.  Contacts, dentures or bridgework may not be worn into surgery.  Leave suitcase in the car. After surgery it may be brought to your room.  For patients admitted to the hospital, checkout time is 11:00 AM the day of              discharge.      Please read over the following fact sheets that you were given:     Preparing for Surgery

## 2018-08-29 ENCOUNTER — Telehealth: Payer: Self-pay | Admitting: Obstetrics and Gynecology

## 2018-08-29 ENCOUNTER — Ambulatory Visit (INDEPENDENT_AMBULATORY_CARE_PROVIDER_SITE_OTHER): Payer: Medicaid Other | Admitting: Obstetrics and Gynecology

## 2018-08-29 ENCOUNTER — Other Ambulatory Visit: Payer: Self-pay

## 2018-08-29 ENCOUNTER — Other Ambulatory Visit (HOSPITAL_COMMUNITY)
Admission: RE | Admit: 2018-08-29 | Discharge: 2018-08-29 | Disposition: A | Discharge status: Home / Self Care | Payer: Medicaid Other | Source: Ambulatory Visit | Attending: Obstetrics and Gynecology | Admitting: Obstetrics and Gynecology

## 2018-08-29 VITALS — BP 117/78 | HR 103 | Temp 98.3°F | Wt 301.3 lb

## 2018-08-29 DIAGNOSIS — Z3A35 35 weeks gestation of pregnancy: Secondary | ICD-10-CM

## 2018-08-29 DIAGNOSIS — O0990 Supervision of high risk pregnancy, unspecified, unspecified trimester: Secondary | ICD-10-CM | POA: Insufficient documentation

## 2018-08-29 DIAGNOSIS — O0993 Supervision of high risk pregnancy, unspecified, third trimester: Secondary | ICD-10-CM

## 2018-08-29 NOTE — Progress Notes (Signed)
Prenatal Visit Note Date: 08/29/2018 Clinic: [facility]  Subjective:  Melinda Richardson is a 23 y.o. G3P1101 at [redacted]w[redacted]d being seen today for ongoing prenatal care.  She is currently monitored for the following issues for this high-risk pregnancy and has Supervision of high risk pregnancy, antepartum; History of preterm delivery, currently pregnant; ASCUS with positive high risk HPV cervical; Maternal varicella, non-immune; History of cesarean delivery; BMI 45.0-49.9, adult (HCC); Obesity in pregnancy with antepartum complication; Abnormal glucose affecting pregnancy; Anemia in pregnancy; and Excessive weight gain affecting pregnancy on their problem list.  Patient reports no complaints.   Contractions: Not present. Vag. Bleeding: None.  Movement: Present. Denies leaking of fluid.   The following portions of the patient's history were reviewed and updated as appropriate: allergies, current medications, past family history, past medical history, past social history, past surgical history and problem list. Problem list updated.  Objective:   Vitals:   08/29/18 1326  BP: 117/78  Pulse: (!) 103  Temp: 98.3 F (36.8 C)  Weight: (!) 301 lb 4.8 oz (136.7 kg)    Fetal Status: Fetal Heart Rate (bpm): 159 Fundal Height: 37 cm Movement: Present     General:  Alert, oriented and cooperative. Patient is in no acute distress.  Skin: Skin is warm and dry. No rash noted.   Cardiovascular: Normal heart rate noted  Respiratory: Normal respiratory effort, no problems with respiration noted  Abdomen: Soft, gravid, appropriate for gestational age. Pain/Pressure: Absent     Pelvic:  Cervical exam deferred        Extremities: Normal range of motion.     Mental Status: Normal mood and affect. Normal behavior. Normal judgment and thought content.   Urinalysis:      Assessment and Plan:  Pregnancy: G3P1101 at [redacted]w[redacted]d  1. Supervision of high risk pregnancy, antepartum 24-28wk gtt not  done. 1hr today. H/o classical c/s-->already scheduled for rpt on 6/2 - Culture, beta strep (group b only) - GC/Chlamydia probe amp ([place])not at [facility] - Glucose tolerance, 1 hour  Preterm labor symptoms and general obstetric precautions including but not limited to vaginal bleeding, contractions, leaking of fluid and fetal movement were reviewed in detail with the patient. Please refer to After Visit Summary for other counseling recommendations.  Return in about 10 days (around 09/08/2018) for 7-10d rob in person. preferably with stinson.   Hilltop Bing, MD

## 2018-08-30 ENCOUNTER — Encounter (HOSPITAL_COMMUNITY): Payer: Self-pay

## 2018-08-30 ENCOUNTER — Other Ambulatory Visit: Payer: Self-pay | Admitting: Obstetrics and Gynecology

## 2018-08-30 ENCOUNTER — Inpatient Hospital Stay (HOSPITAL_COMMUNITY)
Admission: AD | Admit: 2018-08-30 | Discharge: 2018-09-02 | DRG: 786 | Disposition: A | Discharge status: Home / Self Care | Payer: Medicaid Other | Attending: Obstetrics & Gynecology | Admitting: Obstetrics & Gynecology

## 2018-08-30 ENCOUNTER — Inpatient Hospital Stay (HOSPITAL_COMMUNITY): Payer: Medicaid Other | Admitting: Anesthesiology

## 2018-08-30 ENCOUNTER — Other Ambulatory Visit: Payer: Self-pay

## 2018-08-30 ENCOUNTER — Telehealth: Payer: Self-pay

## 2018-08-30 ENCOUNTER — Encounter (HOSPITAL_COMMUNITY)
Admission: AD | Disposition: A | Discharge status: Home / Self Care | Payer: Self-pay | Source: Home / Self Care | Attending: Obstetrics & Gynecology

## 2018-08-30 DIAGNOSIS — Z1159 Encounter for screening for other viral diseases: Secondary | ICD-10-CM | POA: Diagnosis not present

## 2018-08-30 DIAGNOSIS — D649 Anemia, unspecified: Secondary | ICD-10-CM | POA: Diagnosis present

## 2018-08-30 DIAGNOSIS — Z30017 Encounter for initial prescription of implantable subdermal contraceptive: Secondary | ICD-10-CM

## 2018-08-30 DIAGNOSIS — O9902 Anemia complicating childbirth: Secondary | ICD-10-CM | POA: Diagnosis present

## 2018-08-30 DIAGNOSIS — R109 Unspecified abdominal pain: Secondary | ICD-10-CM | POA: Diagnosis present

## 2018-08-30 DIAGNOSIS — O34211 Maternal care for low transverse scar from previous cesarean delivery: Secondary | ICD-10-CM | POA: Diagnosis present

## 2018-08-30 DIAGNOSIS — O99214 Obesity complicating childbirth: Secondary | ICD-10-CM | POA: Diagnosis present

## 2018-08-30 DIAGNOSIS — O99334 Smoking (tobacco) complicating childbirth: Secondary | ICD-10-CM | POA: Diagnosis present

## 2018-08-30 DIAGNOSIS — O42913 Preterm premature rupture of membranes, unspecified as to length of time between rupture and onset of labor, third trimester: Principal | ICD-10-CM | POA: Diagnosis present

## 2018-08-30 DIAGNOSIS — O2600 Excessive weight gain in pregnancy, unspecified trimester: Secondary | ICD-10-CM

## 2018-08-30 DIAGNOSIS — Z3A35 35 weeks gestation of pregnancy: Secondary | ICD-10-CM

## 2018-08-30 DIAGNOSIS — O42013 Preterm premature rupture of membranes, onset of labor within 24 hours of rupture, third trimester: Secondary | ICD-10-CM

## 2018-08-30 DIAGNOSIS — Z98891 History of uterine scar from previous surgery: Secondary | ICD-10-CM

## 2018-08-30 DIAGNOSIS — F17210 Nicotine dependence, cigarettes, uncomplicated: Secondary | ICD-10-CM | POA: Diagnosis present

## 2018-08-30 DIAGNOSIS — O99810 Abnormal glucose complicating pregnancy: Secondary | ICD-10-CM

## 2018-08-30 DIAGNOSIS — O411030 Infection of amniotic sac and membranes, unspecified, third trimester, not applicable or unspecified: Secondary | ICD-10-CM

## 2018-08-30 HISTORY — DX: Anxiety disorder, unspecified: F41.9

## 2018-08-30 LAB — TYPE AND SCREEN
ABO/RH(D): O POS
Antibody Screen: NEGATIVE

## 2018-08-30 LAB — URINALYSIS, ROUTINE W REFLEX MICROSCOPIC
Bilirubin Urine: NEGATIVE
Glucose, UA: 50 mg/dL — AB
Hgb urine dipstick: NEGATIVE
Ketones, ur: NEGATIVE mg/dL
Nitrite: NEGATIVE
Protein, ur: 30 mg/dL — AB
Specific Gravity, Urine: 1.027 (ref 1.005–1.030)
pH: 6 (ref 5.0–8.0)

## 2018-08-30 LAB — GC/CHLAMYDIA PROBE AMP (~~LOC~~) NOT AT ARMC
Chlamydia: NEGATIVE
Neisseria Gonorrhea: NEGATIVE

## 2018-08-30 LAB — AMNISURE RUPTURE OF MEMBRANE (ROM) NOT AT ARMC: Amnisure ROM: POSITIVE

## 2018-08-30 LAB — CBC
HCT: 30.5 % — ABNORMAL LOW (ref 36.0–46.0)
Hemoglobin: 10 g/dL — ABNORMAL LOW (ref 12.0–15.0)
MCH: 28.1 pg (ref 26.0–34.0)
MCHC: 32.8 g/dL (ref 30.0–36.0)
MCV: 85.7 fL (ref 80.0–100.0)
Platelets: 270 10*3/uL (ref 150–400)
RBC: 3.56 MIL/uL — ABNORMAL LOW (ref 3.87–5.11)
RDW: 13.9 % (ref 11.5–15.5)
WBC: 17.4 10*3/uL — ABNORMAL HIGH (ref 4.0–10.5)
nRBC: 0 % (ref 0.0–0.2)

## 2018-08-30 LAB — RAPID HIV SCREEN (HIV 1/2 AB+AG)
HIV 1/2 Antibodies: NONREACTIVE
HIV-1 P24 Antigen - HIV24: NONREACTIVE

## 2018-08-30 LAB — SARS CORONAVIRUS 2 BY RT PCR (HOSPITAL ORDER, PERFORMED IN ~~LOC~~ HOSPITAL LAB): SARS Coronavirus 2: NEGATIVE

## 2018-08-30 LAB — ABO/RH: ABO/RH(D): O POS

## 2018-08-30 LAB — GLUCOSE TOLERANCE, 1 HOUR: Glucose, 1Hr PP: 186 mg/dL (ref 65–199)

## 2018-08-30 SURGERY — Surgical Case
Anesthesia: Spinal

## 2018-08-30 MED ORDER — DIPHENHYDRAMINE HCL 50 MG/ML IJ SOLN
INTRAMUSCULAR | Status: AC
  Filled 2018-08-30: qty 1

## 2018-08-30 MED ORDER — SCOPOLAMINE 1 MG/3DAYS TD PT72
MEDICATED_PATCH | TRANSDERMAL | Status: DC | PRN
  Administered 2018-08-30: 1 via TRANSDERMAL

## 2018-08-30 MED ORDER — SODIUM CHLORIDE 0.9 % IV SOLN
2.0000 g | Freq: Once | INTRAVENOUS | Status: AC
  Administered 2018-08-30: 2 g via INTRAVENOUS
  Filled 2018-08-30: qty 2000

## 2018-08-30 MED ORDER — MORPHINE SULFATE (PF) 0.5 MG/ML IJ SOLN
INTRAMUSCULAR | Status: AC
  Filled 2018-08-30: qty 10

## 2018-08-30 MED ORDER — ONDANSETRON HCL 4 MG/2ML IJ SOLN
INTRAMUSCULAR | Status: AC
  Filled 2018-08-30: qty 2

## 2018-08-30 MED ORDER — SOD CITRATE-CITRIC ACID 500-334 MG/5ML PO SOLN
30.0000 mL | Freq: Once | ORAL | Status: AC
  Administered 2018-08-30: 30 mL via ORAL
  Filled 2018-08-30: qty 30

## 2018-08-30 MED ORDER — PHENYLEPHRINE HCL (PRESSORS) 10 MG/ML IV SOLN
INTRAVENOUS | Status: DC | PRN
  Administered 2018-08-30 (×4): 80 ug via INTRAVENOUS

## 2018-08-30 MED ORDER — LACTATED RINGERS IV BOLUS
1000.0000 mL | Freq: Once | INTRAVENOUS | Status: AC
  Administered 2018-08-30: 1000 mL via INTRAVENOUS

## 2018-08-30 MED ORDER — SODIUM CHLORIDE 0.9 % IR SOLN
Status: DC | PRN
  Administered 2018-08-30: 1000 mL

## 2018-08-30 MED ORDER — SODIUM CHLORIDE 0.9 % IR SOLN
Status: DC | PRN
  Administered 2018-08-30 (×2): 1000 mL

## 2018-08-30 MED ORDER — BUPIVACAINE IN DEXTROSE 0.75-8.25 % IT SOLN
INTRATHECAL | Status: DC | PRN
  Administered 2018-08-30: 1.7 mL via INTRATHECAL

## 2018-08-30 MED ORDER — DEXAMETHASONE SODIUM PHOSPHATE 4 MG/ML IJ SOLN
INTRAMUSCULAR | Status: DC | PRN
  Administered 2018-08-30: 4 mg via INTRAVENOUS

## 2018-08-30 MED ORDER — ACETAMINOPHEN 500 MG PO TABS
1000.0000 mg | ORAL_TABLET | Freq: Once | ORAL | Status: AC
  Administered 2018-08-30: 1000 mg via ORAL
  Filled 2018-08-30: qty 2

## 2018-08-30 MED ORDER — FENTANYL CITRATE (PF) 100 MCG/2ML IJ SOLN
INTRAMUSCULAR | Status: DC | PRN
  Administered 2018-08-30: 15 ug via INTRATHECAL

## 2018-08-30 MED ORDER — CLINDAMYCIN PHOSPHATE 900 MG/50ML IV SOLN
900.0000 mg | Freq: Once | INTRAVENOUS | Status: AC
  Administered 2018-08-30: 900 mg via INTRAVENOUS
  Filled 2018-08-30: qty 50

## 2018-08-30 MED ORDER — LACTATED RINGERS IV SOLN
INTRAVENOUS | Status: DC
  Administered 2018-08-30: 22:00:00 125 mL/h via INTRAVENOUS

## 2018-08-30 MED ORDER — SODIUM CHLORIDE 0.9 % IV SOLN: INTRAVENOUS | Status: DC | PRN

## 2018-08-30 MED ORDER — MORPHINE SULFATE (PF) 0.5 MG/ML IJ SOLN
INTRAMUSCULAR | Status: DC | PRN
  Administered 2018-08-30: .15 mg via INTRATHECAL

## 2018-08-30 MED ORDER — FENTANYL CITRATE (PF) 100 MCG/2ML IJ SOLN
INTRAMUSCULAR | Status: AC
  Filled 2018-08-30: qty 2

## 2018-08-30 MED ORDER — PHENYLEPHRINE HCL-NACL 20-0.9 MG/250ML-% IV SOLN
INTRAVENOUS | Status: DC | PRN
  Administered 2018-08-30: 30 ug/min via INTRAVENOUS

## 2018-08-30 MED ORDER — SODIUM CHLORIDE 0.9 % IV SOLN
INTRAVENOUS | Status: DC | PRN
  Administered 2018-08-30: 40 [IU] via INTRAVENOUS

## 2018-08-30 MED ORDER — GENTAMICIN SULFATE 40 MG/ML IJ SOLN
5.0000 mg/kg | INTRAVENOUS | Status: DC
  Administered 2018-08-30: 440 mg via INTRAVENOUS
  Filled 2018-08-30: qty 11

## 2018-08-30 MED ORDER — SCOPOLAMINE 1 MG/3DAYS TD PT72
MEDICATED_PATCH | TRANSDERMAL | Status: AC
  Filled 2018-08-30: qty 1

## 2018-08-30 MED ORDER — DIPHENHYDRAMINE HCL 50 MG/ML IJ SOLN
INTRAMUSCULAR | Status: DC | PRN
  Administered 2018-08-30: 25 mg via INTRAVENOUS

## 2018-08-30 MED ORDER — SODIUM BICARBONATE 8.4 % IV SOLN
INTRAVENOUS | Status: DC | PRN
  Administered 2018-08-30: 23:00:00 via EPIDURAL

## 2018-08-30 MED ORDER — LACTATED RINGERS IV SOLN
INTRAVENOUS | Status: DC | PRN
  Administered 2018-08-30 (×3): via INTRAVENOUS

## 2018-08-30 MED ORDER — PHENYLEPHRINE 40 MCG/ML (10ML) SYRINGE FOR IV PUSH (FOR BLOOD PRESSURE SUPPORT)
PREFILLED_SYRINGE | INTRAVENOUS | Status: AC
  Filled 2018-08-30: qty 10

## 2018-08-30 MED ORDER — SODIUM CHLORIDE 0.9 % IV SOLN
INTRAVENOUS | Status: DC | PRN
  Administered 2018-08-30: 23:00:00 via INTRAVENOUS

## 2018-08-30 SURGICAL SUPPLY — 40 items
BENZOIN TINCTURE PRP APPL 2/3 (GAUZE/BANDAGES/DRESSINGS) ×3 IMPLANT
CHLORAPREP W/TINT 26ML (MISCELLANEOUS) ×6 IMPLANT
CLAMP CORD UMBIL (MISCELLANEOUS) IMPLANT
CLOSURE WOUND 1/2 X4 (GAUZE/BANDAGES/DRESSINGS) ×1
CLOTH BEACON ORANGE TIMEOUT ST (SAFETY) ×3 IMPLANT
DERMABOND ADVANCED (GAUZE/BANDAGES/DRESSINGS) ×6
DERMABOND ADVANCED .7 DNX12 (GAUZE/BANDAGES/DRESSINGS) ×3 IMPLANT
DRSG OPSITE POSTOP 4X10 (GAUZE/BANDAGES/DRESSINGS) ×3 IMPLANT
ELECT REM PT RETURN 9FT ADLT (ELECTROSURGICAL) ×3
ELECTRODE REM PT RTRN 9FT ADLT (ELECTROSURGICAL) ×1 IMPLANT
EXTRACTOR VACUUM M CUP 4 TUBE (SUCTIONS) IMPLANT
EXTRACTOR VACUUM M CUP 4' TUBE (SUCTIONS)
GLOVE BIOGEL PI IND STRL 7.0 (GLOVE) ×2 IMPLANT
GLOVE BIOGEL PI IND STRL 7.5 (GLOVE) ×2 IMPLANT
GLOVE BIOGEL PI INDICATOR 7.0 (GLOVE) ×4
GLOVE BIOGEL PI INDICATOR 7.5 (GLOVE) ×4
GLOVE ECLIPSE 7.5 STRL STRAW (GLOVE) ×3 IMPLANT
GOWN STRL REUS W/TWL LRG LVL3 (GOWN DISPOSABLE) ×9 IMPLANT
HOVERMATT SINGLE USE (MISCELLANEOUS) ×3 IMPLANT
KIT ABG SYR 3ML LUER SLIP (SYRINGE) IMPLANT
NEEDLE HYPO 25X5/8 SAFETYGLIDE (NEEDLE) IMPLANT
NS IRRIG 1000ML POUR BTL (IV SOLUTION) ×3 IMPLANT
PACK C SECTION WH (CUSTOM PROCEDURE TRAY) ×3 IMPLANT
PAD ABD 8X10 STRL (GAUZE/BANDAGES/DRESSINGS) ×3 IMPLANT
PAD OB MATERNITY 4.3X12.25 (PERSONAL CARE ITEMS) ×3 IMPLANT
PENCIL SMOKE EVAC W/HOLSTER (ELECTROSURGICAL) ×3 IMPLANT
RETRACTOR TRAXI PANNICULUS (MISCELLANEOUS) ×1 IMPLANT
RTRCTR C-SECT PINK 25CM LRG (MISCELLANEOUS) ×3 IMPLANT
SPONGE GAUZE 4X4 12PLY STER LF (GAUZE/BANDAGES/DRESSINGS) ×6 IMPLANT
STRIP CLOSURE SKIN 1/2X4 (GAUZE/BANDAGES/DRESSINGS) ×2 IMPLANT
SUT VIC AB 0 CTX 36 (SUTURE) ×6
SUT VIC AB 0 CTX36XBRD ANBCTRL (SUTURE) ×3 IMPLANT
SUT VIC AB 2-0 CT1 27 (SUTURE) ×2
SUT VIC AB 2-0 CT1 TAPERPNT 27 (SUTURE) ×1 IMPLANT
SUT VIC AB 4-0 KS 27 (SUTURE) ×6 IMPLANT
TAPE CLOTH SURG 6X10 WHT LF (GAUZE/BANDAGES/DRESSINGS) ×3 IMPLANT
TOWEL OR 17X24 6PK STRL BLUE (TOWEL DISPOSABLE) ×3 IMPLANT
TRAXI PANNICULUS RETRACTOR (MISCELLANEOUS) ×2
TRAY FOLEY W/BAG SLVR 14FR LF (SET/KITS/TRAYS/PACK) ×3 IMPLANT
WATER STERILE IRR 1000ML POUR (IV SOLUTION) ×3 IMPLANT

## 2018-08-30 NOTE — H&P (Addendum)
History     CSN: [id number]  Arrival date and time: 08/30/18 1703   First Provider Initiated Contact with Patient 08/30/18 1821      Chief Complaint  Patient presents with  . Abdominal Pain   Melinda Richardson is a 23 y.o. G3P1101 at [redacted]w[redacted]d who presents for Abdominal Pain.  She states the pain started "earlier today around 12" and has been constant.  She describes the pain as "somebody beating" my abdomen.  She reports she has not taken anything for the pain.  She also reports leaking of fluid for the last week that has increased today.  She reports that it is clear fluid without a smell that continues after urination.  She reports that she is having pain "similar to when I had preterm" labor and delivery.  She reports fetal movement. Patient last ate a burger and soda at 3pm.   Patient receives care at [facility] and was supervised for a High risk pregnancy. Pregnancy and medical history significant for problems as listed below. She is scheduled for a repeat C/S d/t a history of Classical Incision.  She is anticipating a female infant and requests Nexplanon for PP birth control method.     Clinic  [facility] Prenatal Labs  Dating LMP Blood type: O/Positive/-- (09/24 0000)   Genetic Screen Quad:  Antibody:Negative (09/24 0000)  Anatomic [place]  normal, follow up scheduled Rubella: immune  GTT Third trimester: HBA1C 6.0 RPR: Nonreactive (09/24 0000)   Flu vaccine 05/02/18 HBsAg:   neg  TDaP vaccine  07/24/18                                           HIV:   NR  Baby Food  bottle                                              GBS:   Contraception  Nexplanon Pap: 03/29/18 normal  Circumcision  n/a   Pediatrician [facility] CF: negative  Support Person   Boyfriend, Shawn SMA  Prenatal Classes No Hgb electrophoresis: negative     OB History    Gravida  3   Para  2   Term  1   Preterm  1   AB  0   Living  1     SAB  0   TAB  0   Ectopic  0   Multiple  0   Live Births   2           Past Medical History:  Diagnosis Date  . Anemia   . Anxiety   . Preterm labor   . Preterm premature rupture of membranes (PPROM) with unknown onset of labor 12/11/2014    Past Surgical History:  Procedure Laterality Date  . CESAREAN SECTION N/A 12/17/2014   Procedure: CESAREAN SECTION;  Surgeon: [name], MD;  Location: [facility];  Service: Obstetrics;  Laterality: N/A;  . CESAREAN SECTION  12/17/14   PTL @ 24 wks - footling breech  . CESAREAN SECTION N/A 04/05/2017   Procedure: CESAREAN SECTION;  Surgeon: [name], DO;  Location: [facility] BIRTHING SUITES;  Service: Obstetrics;  Laterality: N/A;  . CESAREAN SECTION CLASSICAL  12/17/2014   at [redacted]w[redacted]d  .  TONSILLECTOMY      Family History  Problem Relation Age of Onset  . Kidney failure Father   . Diabetes Mother     Social History   Tobacco Use  . Smoking status: Current Every Day Smoker    Packs/day: 0.25    Types: Cigarettes  . Smokeless tobacco: Never Used  Substance Use Topics  . Alcohol use: No  . Drug use: Yes    Types: Marijuana    Allergies: No Known Allergies  Medications Prior to Admission  Medication Sig Dispense Refill Last Dose  . Ferrous Sulfate (IRON PO) Take by mouth.   Taking  . Prenatal Vit w/Fe-Methylfol-FA (PNV PO) Take by mouth.   Taking  . Progesterone 100 MG SUPP Place vaginally.   Taking    Review of Systems  Constitutional: Negative for chills and fever.  Respiratory: Negative for cough and shortness of breath.   Gastrointestinal: Negative for abdominal pain, constipation, diarrhea, nausea and vomiting.  Endocrine: Positive for polyphagia.  Genitourinary: Positive for vaginal discharge and vaginal pain. Negative for dysuria and vaginal bleeding.  Neurological: Negative for dizziness, light-headedness and headaches.   Physical Exam   Blood pressure 131/67, pulse (!) 114, temperature (!) 100.7 F (38.2 C), temperature source Oral, resp. rate 20, last menstrual period  12/27/2017, SpO2 98 %, unknown if currently breastfeeding.  Physical Exam  Constitutional: She is oriented to person, place, and time.  Obese  HENT:  Head: Normocephalic and atraumatic.  Eyes: Conjunctivae are normal.  Neck: Normal range of motion.  Cardiovascular: Normal rate, regular rhythm and normal heart sounds.  GI: Soft. Bowel sounds are normal.  Genitourinary: Cervix exhibits no discharge.    Vaginal discharge present.     No vaginal bleeding.  No bleeding in the vagina.    Genitourinary Comments: Sterile Speculum Exam: -Vaginal Vault: Pink mucosa.  +Pooling -Fern collected -Cervix:Appears closed.  No active leaking noted from os-GC/CT collected -Bimanual Exam: Closed/Long/Thick   Musculoskeletal: Normal range of motion.        General: No edema.  Neurological: She is alert and oriented to person, place, and time.  Skin: Skin is warm and dry.  Psychiatric: She has a normal mood and affect. Her behavior is normal.  MAU Course  Procedures  MDM  Pelvic Exam Fern Amnisure Assessment and Plan  23 year old G3P1101 SIUP at 35.1 weeks Cat I FT Afebrile Vaginal Leakage  -Exam findings discussed -Patient informed that leaking c/w PROM -Fern Inconclusive -Amnisure sent -Will await results   Follow Up (7:30 PM) PROM  -Amnisure returns positive -Patient informed of results and need for admission. -Dr. Despina HiddenEure consulted and informed of patient status.  Advised: *Admit *Order Gentamycin, Clindamycin, and Ampicillin -Patient informed of POC including potential C/S today. -Admit orders held from scheduled procedure.  -Plan for Repeat C/S   Cherre RobinsJessica L Selam Pietsch MSN, CNM 08/30/2018, 6:21 PM

## 2018-08-30 NOTE — MAU Note (Addendum)
Having pain in the bottom of her stomach. Started around 1200.  Constant. When she gets to walking, she  Has to slouch.  She has to lay down.  Has been leaking water  Off and on for a wk.

## 2018-08-30 NOTE — Addendum Note (Signed)
Addended by: Faythe Casa on: 08/30/2018 12:49 PM   Modules accepted: Orders

## 2018-08-30 NOTE — Anesthesia Procedure Notes (Signed)
Spinal  Patient location during procedure: OR Start time: 08/30/2018 10:44 PM End time: 08/30/2018 10:48 PM Staffing Anesthesiologist: Mal Amabile, MD Performed: anesthesiologist  Preanesthetic Checklist Completed: patient identified, site marked, surgical consent, pre-op evaluation, timeout performed, IV checked, risks and benefits discussed and monitors and equipment checked Spinal Block Patient position: sitting Prep: site prepped and draped and DuraPrep Patient monitoring: heart rate, cardiac monitor, continuous pulse ox and blood pressure Approach: midline Location: L3-4 Injection technique: single-shot Needle Needle type: Pencan  Needle gauge: 24 G Needle length: 12.7 cm Needle insertion depth: 11 cm Assessment Sensory level: T4 Additional Notes Patient tolerated procedure well. Adequate sensory level. Attempt x 2. I had to use needle to reach the intrathecal space due to her super MO.

## 2018-08-30 NOTE — Progress Notes (Signed)
Pharmacy Antibiotic Note  Melinda Richardson is a 23 y.o. female admitted on 08/30/2018 with abdominal pain at [redacted]w[redacted]d gestation.Marland Kitchen  Pharmacy has been consulted for Gentamicin dosing due to prolonged rupture per CNM.  Plan: Gentamicin 440mg  (5mg /kg based on adjusted weight of 88.9kg) IV q24h. Will continue to follow and assess need for further workup.    Temp (24hrs), Avg:100.2 F (37.9 C), Min:99.7 F (37.6 C), Max:100.7 F (38.2 C)  No results for input(s): WBC, CREATININE, LATICACIDVEN, VANCOTROUGH, VANCOPEAK, VANCORANDOM, GENTTROUGH, GENTPEAK, GENTRANDOM, TOBRATROUGH, TOBRAPEAK, TOBRARND, AMIKACINPEAK, AMIKACINTROU, AMIKACIN in the last 168 hours.  CrCl cannot be calculated (Patient's most recent lab result is older than the maximum 21 days allowed.).    No Known Allergies  Antimicrobials this admission: Ampicillin 2 gram IV x 1 Clindamycin 900mg  IV x 1    Thank you for allowing pharmacy to be a part of this patient's care.  Claybon Jabs 08/30/2018 7:42 PM

## 2018-08-30 NOTE — Anesthesia Preprocedure Evaluation (Signed)
Anesthesia Evaluation  Patient identified by MRN, date of birth, ID band Patient awake    Reviewed: Allergy & Precautions, NPO status , Patient's Chart, lab work & pertinent test results  Airway Mallampati: II  TM Distance: >3 FB Neck ROM: Full    Dental no notable dental hx. (+) Teeth Intact   Pulmonary Current Smoker,    Pulmonary exam normal breath sounds clear to auscultation       Cardiovascular negative cardio ROS Normal cardiovascular exam Rhythm:Regular Rate:Normal     Neuro/Psych Anxiety    GI/Hepatic Neg liver ROS, GERD  ,  Endo/Other  Morbid obesitySuper MO  Renal/GU negative Renal ROS  negative genitourinary   Musculoskeletal negative musculoskeletal ROS (+)   Abdominal (+) + obese,   Peds  Hematology  (+) anemia ,   Anesthesia Other Findings   Reproductive/Obstetrics (+) Pregnancy 35 weeks Previous C/Section x 2 PPROM                             Anesthesia Physical Anesthesia Plan  ASA: III and emergent  Anesthesia Plan: Spinal   Post-op Pain Management:    Induction:   PONV Risk Score and Plan: 3 and Scopolamine patch - Pre-op, Treatment may vary due to age or medical condition, Dexamethasone and Ondansetron  Airway Management Planned: Natural Airway  Additional Equipment:   Intra-op Plan:   Post-operative Plan:   Informed Consent: I have reviewed the patients History and Physical, chart, labs and discussed the procedure including the risks, benefits and alternatives for the proposed anesthesia with the patient or authorized representative who has indicated his/her understanding and acceptance.     Dental advisory given  Plan Discussed with: CRNA and Surgeon  Anesthesia Plan Comments:         Anesthesia Quick Evaluation

## 2018-08-30 NOTE — Telephone Encounter (Signed)
Notified pt that she failed her 1 hr glucola test and that the provider would like her to come in on Tuesday for 2 hr gtt. Pt stated that she will be able to come in on Tuesday 09/03/18 at 0900 for just a 2 hr GTT per Dr.Pickens.   Order placed in Epic.  Message sent to front office staff to place on schedule.

## 2018-08-30 NOTE — Op Note (Signed)
Preoperative diagnosis:  1.  Intrauterine pregnancy at [redacted]w[redacted]d  weeks gestation                                         2.  PPROM x 1 week                                         3.  Intra amniotic infection                                         4.  Previous Classical Caesarean section   Postoperative diagnosis:  Same as above   Procedure:  Repeat cesarean section  Surgeon:  [name] MD  Assistant:    Anesthesia: Spinal  Findings:  .    Over a low transverse incision was delivered a viable female with Apgars of 9 and 9 weighing pending lbs.  oz. Uterus, tubes and ovaries were all normal.  There were no other significant findings  Description of operation:  Patient was taken to the operating room and placed in the sitting position where she underwent a spinal anesthetic. She was then placed in the supine position with tilt to the left side. When adequate anesthetic level was obtained she was prepped and draped in usual sterile fashion and a Foley catheter was placed. A Pfannenstiel skin incision was made and carried down sharply to the rectus fascia which was scored in the midline extended laterally. The fascia was taken off the muscles both superiorly and without difficulty. The muscles were divided.  The peritoneal cavity was entered.  Bladder blade was placed, no bladder flap was created.  A low transverse hysterotomy incision was made and delivered a viable female  infant at 72 with Apgars of 9 and 9 weighingpending lbs  oz.  Cord pH was obtained and was 7.27. The uterus was exteriorized. It was closed in 2 layers, the first being a running interlocking layer and the second being an imbricating layer using 0 monocryl on a CTX needle. There was good resulting hemostasis. The uterus tubes and ovaries were all normal. Peritoneal cavity was irrigated vigorously. The muscles and peritoneum were reapproximated loosely. The fascia was closed using 0 Vicryl in running fashion. Subcutaneous  tissue was made hemostatic and irrigated. The skin was closed using 4-0 Vicryl on a Keith needle in a subcuticular fashion.  Dermabond was placed for additional wound integrity and to serve as a barrier. Blood loss for the procedure was 168 cc. The patient received a ampicillin/gentmicin/cleocin before surgery due to intra amniotic infection.  The patient was taken to the recovery room in good stable condition with all counts being correct x3.  QBL 168 cc  [name] 08/30/2018 11:56 PM

## 2018-08-31 ENCOUNTER — Encounter (HOSPITAL_COMMUNITY): Payer: Self-pay

## 2018-08-31 DIAGNOSIS — Z98891 History of uterine scar from previous surgery: Secondary | ICD-10-CM

## 2018-08-31 LAB — CBC
HCT: 27.4 % — ABNORMAL LOW (ref 36.0–46.0)
Hemoglobin: 8.9 g/dL — ABNORMAL LOW (ref 12.0–15.0)
MCH: 28 pg (ref 26.0–34.0)
MCHC: 32.5 g/dL (ref 30.0–36.0)
MCV: 86.2 fL (ref 80.0–100.0)
Platelets: 232 10*3/uL (ref 150–400)
RBC: 3.18 MIL/uL — ABNORMAL LOW (ref 3.87–5.11)
RDW: 14 % (ref 11.5–15.5)
WBC: 18.9 10*3/uL — ABNORMAL HIGH (ref 4.0–10.5)
nRBC: 0 % (ref 0.0–0.2)

## 2018-08-31 LAB — RPR: RPR Ser Ql: NONREACTIVE

## 2018-08-31 MED ORDER — NALBUPHINE HCL 10 MG/ML IJ SOLN: 5.0000 mg | INTRAMUSCULAR | Status: DC | PRN

## 2018-08-31 MED ORDER — NALOXONE HCL 4 MG/10ML IJ SOLN
1.0000 ug/kg/h | INTRAVENOUS | Status: DC | PRN
  Filled 2018-08-31: qty 5

## 2018-08-31 MED ORDER — KETOROLAC TROMETHAMINE 30 MG/ML IJ SOLN: 30.0000 mg | Freq: Four times a day (QID) | INTRAMUSCULAR | Status: AC | PRN

## 2018-08-31 MED ORDER — KETOROLAC TROMETHAMINE 30 MG/ML IJ SOLN
30.0000 mg | Freq: Four times a day (QID) | INTRAMUSCULAR | Status: AC
  Administered 2018-08-31 (×4): 30 mg via INTRAVENOUS
  Filled 2018-08-31 (×4): qty 1

## 2018-08-31 MED ORDER — NALBUPHINE HCL 10 MG/ML IJ SOLN: 5.0000 mg | Freq: Once | INTRAMUSCULAR | Status: DC | PRN

## 2018-08-31 MED ORDER — WITCH HAZEL-GLYCERIN EX PADS: 1.0000 "application " | MEDICATED_PAD | CUTANEOUS | Status: DC | PRN

## 2018-08-31 MED ORDER — COCONUT OIL OIL: 1.0000 "application " | TOPICAL_OIL | Status: DC | PRN

## 2018-08-31 MED ORDER — FENTANYL CITRATE (PF) 100 MCG/2ML IJ SOLN
INTRAMUSCULAR | Status: AC
  Filled 2018-08-31: qty 2

## 2018-08-31 MED ORDER — MEPERIDINE HCL 25 MG/ML IJ SOLN: 6.2500 mg | INTRAMUSCULAR | Status: DC | PRN

## 2018-08-31 MED ORDER — SENNOSIDES-DOCUSATE SODIUM 8.6-50 MG PO TABS
2.0000 | ORAL_TABLET | ORAL | Status: DC
  Administered 2018-09-01 (×2): 2 via ORAL
  Filled 2018-08-31 (×2): qty 2

## 2018-08-31 MED ORDER — SIMETHICONE 80 MG PO CHEW
80.0000 mg | CHEWABLE_TABLET | ORAL | Status: DC
  Administered 2018-09-01 (×2): 80 mg via ORAL
  Filled 2018-08-31 (×2): qty 1

## 2018-08-31 MED ORDER — DIPHENHYDRAMINE HCL 50 MG/ML IJ SOLN
12.5000 mg | INTRAMUSCULAR | Status: DC | PRN
  Administered 2018-08-31: 04:00:00 12.5 mg via INTRAVENOUS
  Filled 2018-08-31: qty 1

## 2018-08-31 MED ORDER — NALOXONE HCL 0.4 MG/ML IJ SOLN: 0.4000 mg | INTRAMUSCULAR | Status: DC | PRN

## 2018-08-31 MED ORDER — ZOLPIDEM TARTRATE 5 MG PO TABS: 5.0000 mg | ORAL_TABLET | Freq: Every evening | ORAL | Status: DC | PRN

## 2018-08-31 MED ORDER — OXYTOCIN 40 UNITS IN NORMAL SALINE INFUSION - SIMPLE MED
2.5000 [IU]/h | INTRAVENOUS | Status: AC
  Administered 2018-08-31: 2.5 [IU]/h via INTRAVENOUS

## 2018-08-31 MED ORDER — ONDANSETRON HCL 4 MG/2ML IJ SOLN: 4.0000 mg | Freq: Three times a day (TID) | INTRAMUSCULAR | Status: DC | PRN

## 2018-08-31 MED ORDER — SODIUM CHLORIDE 0.9% FLUSH: 3.0000 mL | INTRAVENOUS | Status: DC | PRN

## 2018-08-31 MED ORDER — SIMETHICONE 80 MG PO CHEW
80.0000 mg | CHEWABLE_TABLET | Freq: Three times a day (TID) | ORAL | Status: DC
  Administered 2018-08-31 – 2018-09-02 (×8): 80 mg via ORAL
  Filled 2018-08-31 (×8): qty 1

## 2018-08-31 MED ORDER — OXYCODONE-ACETAMINOPHEN 5-325 MG PO TABS
1.0000 | ORAL_TABLET | ORAL | Status: DC | PRN
  Administered 2018-08-31 – 2018-09-01 (×5): 1 via ORAL
  Administered 2018-09-02: 2 via ORAL
  Administered 2018-09-02: 1 via ORAL
  Filled 2018-08-31: qty 1
  Filled 2018-08-31: qty 2
  Filled 2018-08-31 (×5): qty 1

## 2018-08-31 MED ORDER — TETANUS-DIPHTH-ACELL PERTUSSIS 5-2.5-18.5 LF-MCG/0.5 IM SUSP: 0.5000 mL | Freq: Once | INTRAMUSCULAR | Status: DC

## 2018-08-31 MED ORDER — SODIUM CHLORIDE 0.9 % IV SOLN
3.0000 g | Freq: Three times a day (TID) | INTRAVENOUS | Status: AC
  Administered 2018-08-31 (×3): 3 g via INTRAVENOUS
  Filled 2018-08-31 (×3): qty 3

## 2018-08-31 MED ORDER — SIMETHICONE 80 MG PO CHEW: 80.0000 mg | CHEWABLE_TABLET | ORAL | Status: DC | PRN

## 2018-08-31 MED ORDER — FENTANYL CITRATE (PF) 100 MCG/2ML IJ SOLN
25.0000 ug | INTRAMUSCULAR | Status: DC | PRN
  Administered 2018-08-31: 50 ug via INTRAVENOUS

## 2018-08-31 MED ORDER — LACTATED RINGERS IV SOLN
INTRAVENOUS | Status: DC
  Administered 2018-08-31: 07:00:00 via INTRAVENOUS

## 2018-08-31 MED ORDER — MENTHOL 3 MG MT LOZG: 1.0000 | LOZENGE | OROMUCOSAL | Status: DC | PRN

## 2018-08-31 MED ORDER — ENOXAPARIN SODIUM 80 MG/0.8ML ~~LOC~~ SOLN
0.5000 mg/kg | SUBCUTANEOUS | Status: DC
  Administered 2018-08-31: 70 mg via SUBCUTANEOUS
  Filled 2018-08-31 (×2): qty 0.8

## 2018-08-31 MED ORDER — IBUPROFEN 800 MG PO TABS
800.0000 mg | ORAL_TABLET | Freq: Four times a day (QID) | ORAL | Status: DC
  Administered 2018-09-01 – 2018-09-02 (×6): 800 mg via ORAL
  Filled 2018-08-31 (×6): qty 1

## 2018-08-31 MED ORDER — METHYLERGONOVINE MALEATE 0.2 MG PO TABS: 0.2000 mg | ORAL_TABLET | ORAL | Status: DC | PRN

## 2018-08-31 MED ORDER — DIBUCAINE (PERIANAL) 1 % EX OINT: 1.0000 "application " | TOPICAL_OINTMENT | CUTANEOUS | Status: DC | PRN

## 2018-08-31 MED ORDER — METHYLERGONOVINE MALEATE 0.2 MG/ML IJ SOLN: 0.2000 mg | INTRAMUSCULAR | Status: DC | PRN

## 2018-08-31 MED ORDER — DIPHENHYDRAMINE HCL 25 MG PO CAPS: 25.0000 mg | ORAL_CAPSULE | Freq: Four times a day (QID) | ORAL | Status: DC | PRN

## 2018-08-31 MED ORDER — DIPHENHYDRAMINE HCL 25 MG PO CAPS: 25.0000 mg | ORAL_CAPSULE | ORAL | Status: DC | PRN

## 2018-08-31 MED ORDER — PRENATAL MULTIVITAMIN CH
1.0000 | ORAL_TABLET | Freq: Every day | ORAL | Status: DC
  Administered 2018-08-31 – 2018-09-01 (×2): 1 via ORAL
  Filled 2018-08-31 (×3): qty 1

## 2018-08-31 MED ORDER — GABAPENTIN 100 MG PO CAPS
100.0000 mg | ORAL_CAPSULE | Freq: Three times a day (TID) | ORAL | Status: DC
  Administered 2018-08-31 – 2018-09-02 (×7): 100 mg via ORAL
  Filled 2018-08-31 (×7): qty 1

## 2018-08-31 NOTE — Progress Notes (Signed)
Post-Op Day 1, CS for hx classical/PPROM  Subjective: No complaints, is not up yet as delivered last last night, tolerating PO, passing flatus,small lochia,.plans to bottle feed, wants inpt Nexplanon.  Still considering BUFA  Objective: Blood pressure 125/69, pulse 82, temperature 97.7 F (36.5 C), resp. rate 20, height 5\' 5"  (1.651 m), weight (!) 136.7 kg, last menstrual period 12/27/2017, SpO2 100 %, unknown if currently breastfeeding.  Physical Exam:  General: alert, cooperative and no distress Lochia:normal flow Chest: CTAB Heart: RRR no m/r/g Abdomen: +BS, soft, nontender, dsg c/d/intact, no erythema Uterine Fundus: firm DVT Evaluation: No evidence of DVT seen on physical exam. Extremities: no edema  Recent Labs    08/30/18 2027 08/31/18 0529  HGB 10.0* 8.9*  HCT 30.5* 27.4*    Assessment/Plan: Social Work consult  Plan Nexplanon during stay   LOS: 1 day   Jacklyn Shell 08/31/2018, 11:07 AM

## 2018-08-31 NOTE — Progress Notes (Signed)
CSW aware of consult for MOB's history of anxiety and due to MOB wanting to place baby up for adoption. CSW met with MOB at bedside to offer support and complete assessment. MOB unsure, at this time, if she wants to give baby up for adoption or not. MOB would like to discuss further with FOB before making final decision. CSW validated MOB's feelings and allowed MOB to speak about concerns and ask questions. MOB stated she plans to speak with FOB this morning. CSW will continue to follow and complete full assessment at a later time.   Ollen Barges, [place]  [facility] and [employer] [phone]

## 2018-08-31 NOTE — Transfer of Care (Signed)
Immediate Anesthesia Transfer of Care Note  Patient: Melinda Richardson  Procedure(s) Performed: CESAREAN SECTION (N/A )  Patient Location: PACU  Anesthesia Type:Spinal  Level of Consciousness: awake, alert  and oriented  Airway & Oxygen Therapy: Patient Spontanous Breathing  Post-op Assessment: Report given to RN and Post -op Vital signs reviewed and stable  Post vital signs: Reviewed and stable  Last Vitals:  Vitals Value Taken Time  BP 110/96 08/31/2018 12:16 AM  Temp    Pulse 88 08/31/2018 12:17 AM  Resp 20 08/31/2018 12:18 AM  SpO2 100 % 08/31/2018 12:17 AM  Vitals shown include unvalidated device data.  Last Pain:  Vitals:   08/30/18 2128  TempSrc: Oral  PainSc:          Complications: No apparent anesthesia complications

## 2018-08-31 NOTE — Anesthesia Postprocedure Evaluation (Signed)
Anesthesia Post Note  Patient: Manufacturing engineer  Procedure(s) Performed: CESAREAN SECTION (N/A )     Patient location during evaluation: PACU Anesthesia Type: Spinal Level of consciousness: awake and alert and oriented Pain management: pain level controlled Vital Signs Assessment: post-procedure vital signs reviewed and stable Respiratory status: spontaneous breathing, nonlabored ventilation and respiratory function stable Cardiovascular status: blood pressure returned to baseline and stable Postop Assessment: patient able to bend at knees, spinal receding, no headache, no backache and no apparent nausea or vomiting Anesthetic complications: no                    Artina Minella A.

## 2018-09-01 MED ORDER — ETONOGESTREL 68 MG ~~LOC~~ IMPL
68.0000 mg | DRUG_IMPLANT | Freq: Once | SUBCUTANEOUS | Status: AC
  Administered 2018-09-01: 68 mg via SUBCUTANEOUS
  Filled 2018-09-01: qty 1

## 2018-09-01 MED ORDER — LIDOCAINE HCL 1 % IJ SOLN
0.0000 mL | Freq: Once | INTRAMUSCULAR | Status: AC | PRN
  Administered 2018-09-01: 20 mL via INTRADERMAL
  Filled 2018-09-01 (×2): qty 20

## 2018-09-01 NOTE — Progress Notes (Signed)
Subjective: Postpartum Day 2: Cesarean Delivery Patient reports incisional pain, tolerating PO and no problems voiding.    Objective: Vital signs in last 24 hours: Temp:  [98.2 F (36.8 C)-98.6 F (37 C)] 98.2 F (36.8 C) (05/24 0554) Pulse Rate:  [69-86] 74 (05/24 0554) Resp:  [18-20] 18 (05/24 0554) BP: (119-132)/(63-71) 130/71 (05/24 0554) SpO2:  [99 %] 99 % (05/23 1610)  Physical Exam:  General: alert, cooperative and no distress Lochia: appropriate Uterine Fundus: firm Incision: healing well, no significant drainage DVT Evaluation: No evidence of DVT seen on physical exam.  Recent Labs    08/30/18 2027 08/31/18 0529  HGB 10.0* 8.9*  HCT 30.5* 27.4*    Assessment/Plan: Status post Cesarean section. Doing well postoperatively.  Continue current care WIll insert Nexplanon today .  Wynelle Bourgeois 09/01/2018, 1:01 PM

## 2018-09-01 NOTE — Progress Notes (Signed)
Patient ID: Melinda Richardson, female   DOB: 11-20-1995, 23 y.o.   MRN: [id number] Patient given informed consent, she signed consent form. Pregnancy test was negative.  Appropriate time out taken.  Patient's left arm was prepped and draped in the usual sterile fashion.. The ruler used to measure and mark insertion area.  Patient was prepped with alcohol swab and then injected with 5 ml of 1 % lidocaine.  She was prepped with betadine, Nexplanon removed from packaging,  Device confirmed in needle, then inserted full length of needle and withdrawn per handbook instructions.  There was minimal blood loss.  Patient insertion site covered with guaze and a pressure bandage to reduce any bruising.  The patient tolerated the procedure well and was given post procedure instructions. Return in about one month for Nexplanon check.  Nexplanon Insertion Procedure Patient identified, informed consent performed, consent signed.  Time Out was performed   Patient does understand that irregular bleeding is a very common side effect of this medication. She was advised to have backup contraception for one week after placement. Appropriate time out taken.  Patient's left arm was prepped and draped in the usual sterile fashion.. The ruler used to measure and mark insertion area.  Patient was prepped with alcohol swab and then injected with 3 ml of 1% lidocaine.  She was prepped with betadine, Nexplanon removed from packaging,  Device confirmed in needle, then inserted full length of needle and withdrawn per handbook instructions. Nexplanon was able to palpated in the patient's arm; patient palpated the insert herself. There was minimal blood loss.  Patient insertion site covered with guaze and a pressure bandage to reduce any bruising.  The patient tolerated the procedure well and was given post procedure instructions.

## 2018-09-01 NOTE — Clinical Social Work Maternal (Signed)
CLINICAL SOCIAL WORK MATERNAL/CHILD NOTE  Patient Details  Name: Melinda Richardson MRN: [id number] Date of Birth: 05/15/1995  Date:  09/01/2018  Clinical Social Worker Initiating Note:  Lamyra Malcolm Date/Time: Initiated:  09/01/18/1029     Child's Name:  Melinda Richardson   Biological Parents:  Mother, Father(Quanetta Eichorn and Shawn West DOB: 02/07/1985)   Need for Interpreter:  None   Reason for Referral:  Adoption, Behavioral Health Concerns   Address:  [address] [place] [address]    Phone number:  [phone] (home)     Additional phone number:   Household Members/Support Persons (HM/SP):   Household Member/Support Person 1, Household Member/Support Person 2   HM/SP Name Relationship DOB or Age  HM/SP -1 Glenette Peterkin Mother    HM/SP -2 [place] Daughter  04/05/2017  HM/SP -3        HM/SP -4        HM/SP -5        HM/SP -6        HM/SP -7        HM/SP -8          Natural Supports (not living in the home):  Parent, Spouse/significant other   Professional Supports: None   Employment: Unemployed   Type of Work:     Education:  9 to 11 years   Homebound arranged:    Financial Resources:      Other Resources:  WIC, Food Stamps    Cultural/Religious Considerations Which May Impact Care:    Strengths:  Ability to meet basic needs , Home prepared for child , Pediatrician chosen   Psychotropic Medications:         Pediatrician:    [place] area  Pediatrician List:   [place] [facility] ([address])  [place]    [place]    [place]    [place]      Pediatrician Fax Number:    Risk Factors/Current Problems:      Cognitive State:  Able to Concentrate , Alert , Linear Thinking    Mood/Affect:  Calm , Comfortable , Happy , Interested , Relaxed    CSW Assessment:  CSW received consult for history of anxiety and due to MOB  considering putting baby up for adoption.  CSW met with MOB to further offer support and complete assessment.    MOB sitting up in bed talking on the phone, when CSW entered the room. CSW offered to come back at a later time but MOB welcoming of CSW staying. MOB reported that she was feeling better. MOB reported she currently lives with her mother and her 1-year-old daughter but stated she and her children would be moving in with FOB once discharged ([address], [place], [place]). MOB stated she is not currently employed and that her highest level of education completed was some high school. MOB confirmed she receives both WIC and food stamps and is aware of process to get infant added on to her plans. CSW inquired about MOB's mental health history and MOB denied haing any. CSW questioned MOB on if she had ever experienced any anxiety and MOB denied but was receptive to education provided. CSW provided education regarding the baby blues period vs. perinatal mood disorders, discussed treatment and gave resources for mental health follow up if concerns arise.  CSW recommends self-evaluation during the postpartum time period using the New Mom Checklist from   Postpartum Progress and encouraged MOB to contact a medical professional if symptoms are noted at any time. MOB denied any current SI, HI or DV and stated her mother and FOB are her biggest supports.   CSW aware, per conversation with MOB on 5/23, that she had not yet decided if she was going to keep infant or not. MOB had shared that she wanted to speak with FOB before making a final decision. CSW reminded MOB that staff would support MOB in whatever decision she made. During second visit, MOB reported to CSW that she and FOB have decided to keep infant. CSW allowed MOB to talk about her feelings and concerns. MOB stated she feels prepared to keep infant and take infant home. MOB shared that there were still some items that she would need assistance  acquiring such as a temporary safe sleeping space for infant and some outfits. CSW to provide MOB with a Baby Box and Baby Bundle to address these concerns. MOB also open and agreeable to being referred for [facility] and Healthy Start Program that could offer additional support to MOB and infant once home. CSW provided review of Sudden Infant Death Syndrome (SIDS) precautions and safe sleeping habits. MOB stated she intends to have infant follow up at [facility] for follow up appointments.   MOB denied any further questions, concerns, or need for resources at this time. CSW will continue to follow to offer support and address any concerns that may arise.   CSW Plan/Description:  No Further Intervention Required/No Barriers to Discharge, Sudden Infant Death Syndrome (SIDS) Education, Perinatal Mood and Anxiety Disorder (PMADs) Education, Other Information/Referral to Community Resources    Elisa Sorlie, LCSWA 09/01/2018, 12:04 PM  

## 2018-09-02 LAB — CULTURE, BETA STREP (GROUP B ONLY): Strep Gp B Culture: NEGATIVE

## 2018-09-02 MED ORDER — OXYCODONE-ACETAMINOPHEN 5-325 MG PO TABS: 1.0000 | ORAL_TABLET | Freq: Four times a day (QID) | ORAL | 0 refills | Status: DC | PRN

## 2018-09-02 MED ORDER — IBUPROFEN 800 MG PO TABS: 800.0000 mg | ORAL_TABLET | Freq: Four times a day (QID) | ORAL | 0 refills | Status: DC

## 2018-09-02 NOTE — Discharge Instructions (Signed)

## 2018-09-02 NOTE — Discharge Summary (Signed)
Postpartum Discharge Summary     Patient Name: Melinda Richardson DOB: 05/29/1995 MRN: [id number]  Date of admission: 08/30/2018 Delivering Provider: [name]   Date of discharge: 09/02/2018  Admitting diagnosis: vaginal pain  Intrauterine pregnancy: [redacted]w[redacted]d     Secondary diagnosis:  Active Problems:   S/P cesarean section  Additional problems: none     Discharge diagnosis: Preterm Pregnancy Delivered                                                                                                Post partum procedures:nexplanon insertion  Augmentation: n/a  Complications: None  Hospital course:  Onset of Labor With Unplanned C/S  23 y.o. yo [id number] at [redacted]w[redacted]d was admitted with PPROM on 08/30/2018. Membrane Rupture Time/Date: 10:00 AM ,08/23/2018   The patient went for cesarean section due to Prior Uterine Surgery, and delivered a Viable infant,08/30/2018  Details of operation can be found in separate operative note. Patient had an uncomplicated postpartum course.  She is ambulating,tolerating a regular diet, passing flatus, and urinating well.  Patient is discharged home in stable condition 09/02/18.  Magnesium Sulfate recieved: No BMZ received: No  Physical exam  Vitals:   09/01/18 0112 09/01/18 0554 09/01/18 1528 09/02/18 0505  BP: 132/69 130/71 129/76 132/81  Pulse: 86 74 85 77  Resp: 18 18 17 16   Temp: 98.2 F (36.8 C) 98.2 F (36.8 C) 98.3 F (36.8 C) 98.3 F (36.8 C)  TempSrc: Oral Oral Oral Oral  SpO2:      Weight:      Height:       General: alert, cooperative and no distress Lochia: appropriate Uterine Fundus: firm Incision: Healing well with no significant drainage, No significant erythema, Dressing is clean, dry, and intact DVT Evaluation: No evidence of DVT seen on physical exam. Negative Homan's sign. No cords or calf tenderness. Labs: Lab Results  Component Value Date   WBC 18.9 (H) 08/31/2018   HGB 8.9 (L) 08/31/2018   HCT 27.4 (L) 08/31/2018    MCV 86.2 08/31/2018   PLT 232 08/31/2018   CMP Latest Ref Rng & Units 03/29/2018  Glucose 65 - 99 mg/dL 87  BUN 6 - 20 mg/dL 9  Creatinine 4.65 - 6.81 mg/dL 2.75  Sodium 170 - 017 mmol/L 138  Potassium 3.5 - 5.2 mmol/L 4.2  Chloride 96 - 106 mmol/L 101  CO2 20 - 29 mmol/L 21  Calcium 8.7 - 10.2 mg/dL 9.2  Total Protein 6.0 - 8.5 g/dL 6.9  Total Bilirubin 0.0 - 1.2 mg/dL <4.9  Alkaline Phos 39 - 117 IU/L 52  AST 0 - 40 IU/L 13  ALT 0 - 32 IU/L 18    Discharge instruction: per After Visit Summary and "Baby and Me Booklet".  After visit meds:  Allergies as of 09/02/2018   No Known Allergies     Medication List    STOP taking these medications   IRON PO   PNV PO   Progesterone 100 MG Supp     TAKE these medications   ibuprofen 800 MG tablet Commonly known as:  ADVIL Take 1 tablet (800 mg total) by mouth every 6 (six) hours.   oxyCODONE-acetaminophen 5-325 MG tablet Commonly known as:  PERCOCET/ROXICET Take 1 tablet by mouth every 6 (six) hours as needed for moderate pain or severe pain.       Diet: routine diet  Activity: Advance as tolerated. Pelvic rest for 6 weeks.   Outpatient follow up:2 weeks for incision check Follow up Appt: Future Appointments  Date Time Provider Department Center  09/06/2018  8:30 AM [facility] 1 [facility] None  09/09/2018 10:35 AM [place] BingPickens, Charlie, MD [facility]   Follow up Visit: Follow-up Information    [facility]. Schedule an appointment as soon as possible for a visit in 4 week(s).   Specialty:  Obstetrics and Gynecology Why:  for postpartum checkup Contact information: [address], [address] [id number] [place] [phone]           Please schedule this patient for Postpartum visit in: 4 weeks with the following provider: Any provider For C/S patients schedule nurse incision check in weeks 2 weeks: yes Low risk pregnancy complicated  by:  Delivery mode:  CS Anticipated Birth Control:  Nexplanon received in hospital PP Procedures needed: Incision check  Schedule Integrated BH visit: yes had planned BUFA, changed mind      Newborn Data: Live born female  Birth Weight: 5 lb 14.5 oz (2679 g) APGAR: 9, 9  Newborn Delivery   Birth date/time:  08/30/2018 23:17:00 Delivery type:  C-Section, Low Transverse Trial of labor:  No C-section categorization:  Repeat     Baby Feeding: Bottle Disposition:home with mother   09/02/2018 Jacklyn ShellFrances Cresenzo-Dishmon, CNM

## 2018-09-03 LAB — HEMOGLOBIN A1C
Est. average glucose Bld gHb Est-mCnc: 140 mg/dL
Hgb A1c MFr Bld: 6.5 % — ABNORMAL HIGH (ref 4.8–5.6)

## 2018-09-03 LAB — SPECIMEN STATUS REPORT

## 2018-09-04 ENCOUNTER — Other Ambulatory Visit: Payer: Self-pay

## 2018-09-06 ENCOUNTER — Other Ambulatory Visit (HOSPITAL_COMMUNITY): Admission: RE | Admit: 2018-09-06 | Payer: Medicaid Other | Source: Ambulatory Visit

## 2018-09-09 ENCOUNTER — Encounter: Payer: Medicaid Other | Admitting: Obstetrics and Gynecology

## 2018-09-10 ENCOUNTER — Inpatient Hospital Stay (HOSPITAL_COMMUNITY): Admission: RE | Admit: 2018-09-10 | Payer: Medicaid Other | Source: Home / Self Care | Admitting: Family Medicine

## 2018-09-13 ENCOUNTER — Ambulatory Visit: Payer: Self-pay

## 2018-09-23 NOTE — Telephone Encounter (Signed)
Opened in error

## 2018-09-25 ENCOUNTER — Telehealth: Payer: Self-pay | Admitting: Medical

## 2018-09-25 NOTE — Telephone Encounter (Signed)
Called the patient to confirm upcoming virtual appointment. The patient verbalized understanding and has the app downloaded. Informed of how to access the appointment by tapping on the appointment via a smart phone. A link will appear to click to begin virtual visit. Please log in 5-10 mins prior to the appointment time. °

## 2018-09-27 ENCOUNTER — Telehealth: Payer: Medicaid Other | Admitting: Obstetrics & Gynecology

## 2018-09-27 ENCOUNTER — Other Ambulatory Visit: Payer: Self-pay

## 2018-09-27 NOTE — Progress Notes (Signed)
@  845am No Answer LVM to call the office back for her appointment today Advised will call back in 10 minuets and if we miss her to call back to reschedule.  @855am  LVM with information for Mychart and to call office to reschedule appointment. @9am  Sent Mychart message on how to reschedule appointment.

## 2018-10-01 ENCOUNTER — Other Ambulatory Visit: Payer: Self-pay

## 2018-10-22 ENCOUNTER — Ambulatory Visit: Payer: Medicaid Other

## 2018-10-22 ENCOUNTER — Other Ambulatory Visit: Payer: Self-pay

## 2018-10-22 DIAGNOSIS — Z5329 Procedure and treatment not carried out because of patient's decision for other reasons: Secondary | ICD-10-CM

## 2018-10-22 DIAGNOSIS — Z91199 Patient's noncompliance with other medical treatment and regimen due to unspecified reason: Secondary | ICD-10-CM

## 2018-10-22 NOTE — Progress Notes (Signed)
LM for pt that I am calling in regards to her 0855 appt if she could please call the office and that I will call back in 15 min in which if she does not pick up we will have to request that she reschedules.    LM @ [phone] that is our second attempt in trying to reach her if she could please call and reschedule her as that she was not available for her appt.    Mel Almond, RN 10/22/18

## 2018-10-22 NOTE — Progress Notes (Signed)
Unable to reach patient for virtual visit. Will reschedule.  Wende Mott, CNM 10/22/18 9:20 AM

## 2018-11-16 IMAGING — US US MFM OB DETAIL+14 WK
1 series · 13 of 28 positions shown · non-contrast
Comparison: none

[Series 1: us mfm ob detail+14 wk · 13 of 93 slices shown]
[im 4/93]
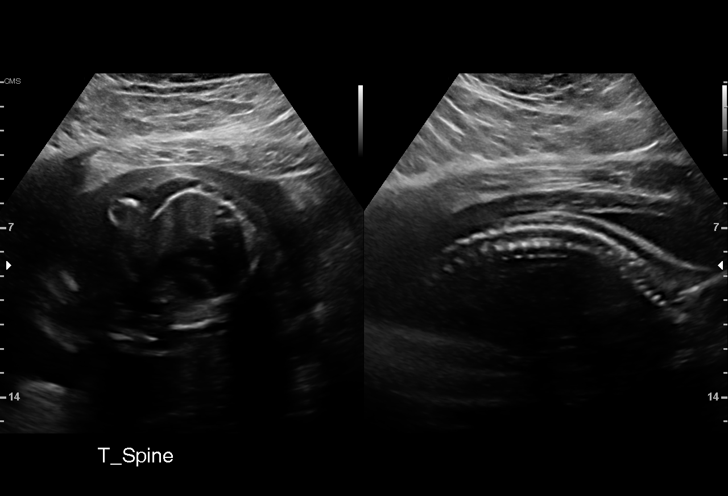
[im 11/93]
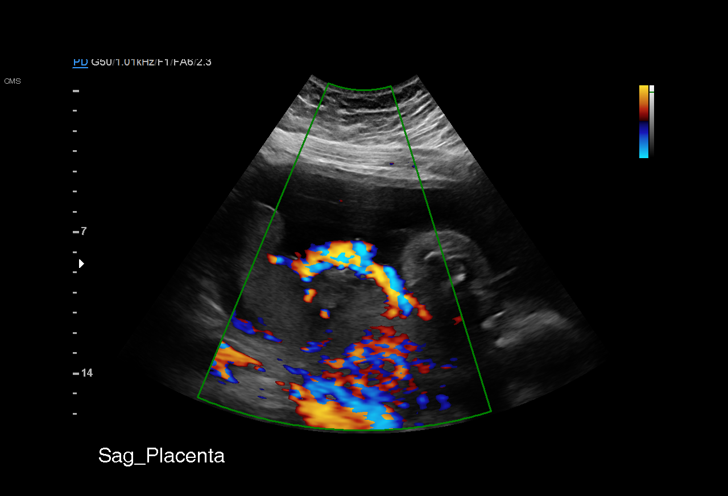
[im 18/93]
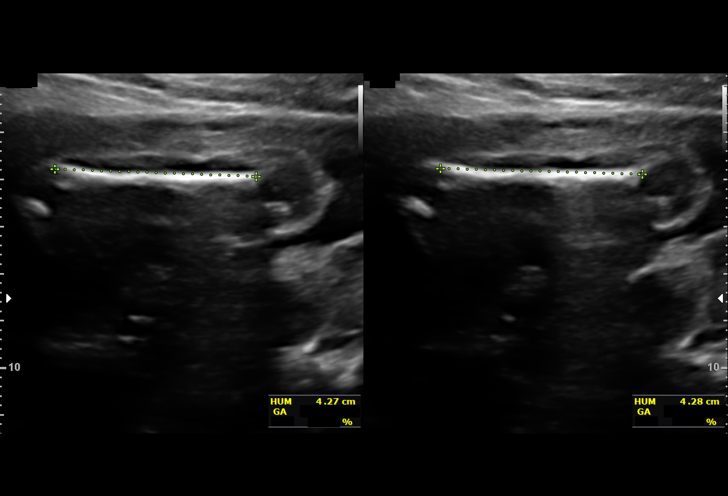
[im 24/93]
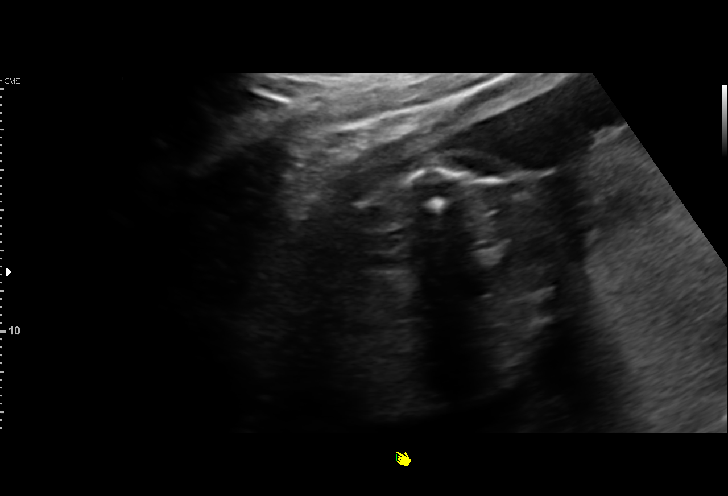
[im 31/93]
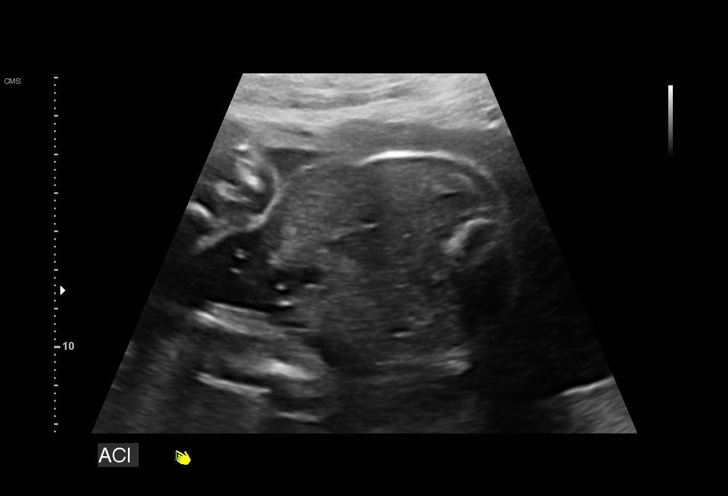
[im 38/93]
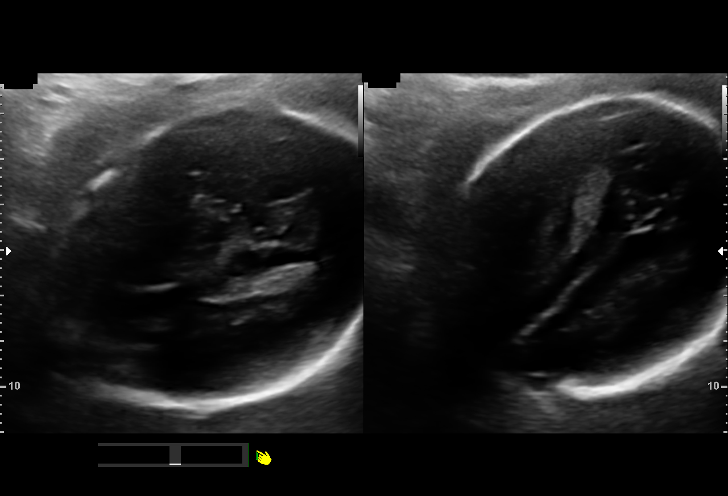
[im 48/93]
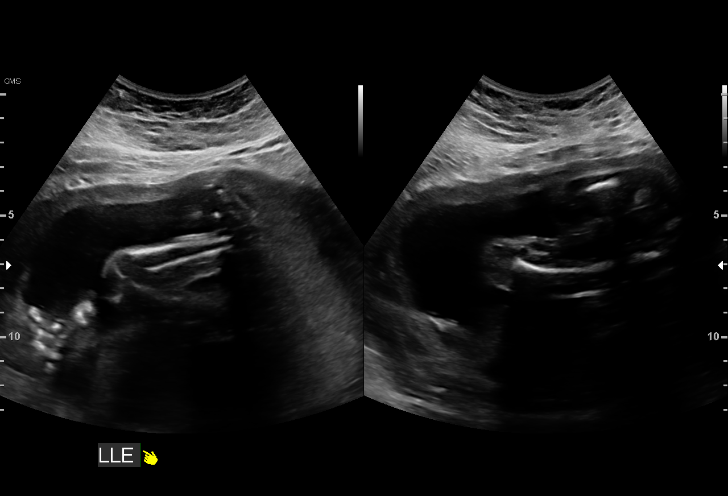
[im 55/93]
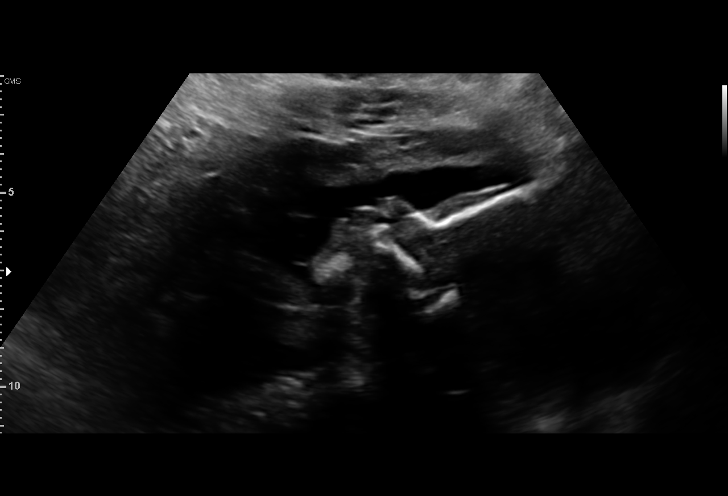
[im 62/93]
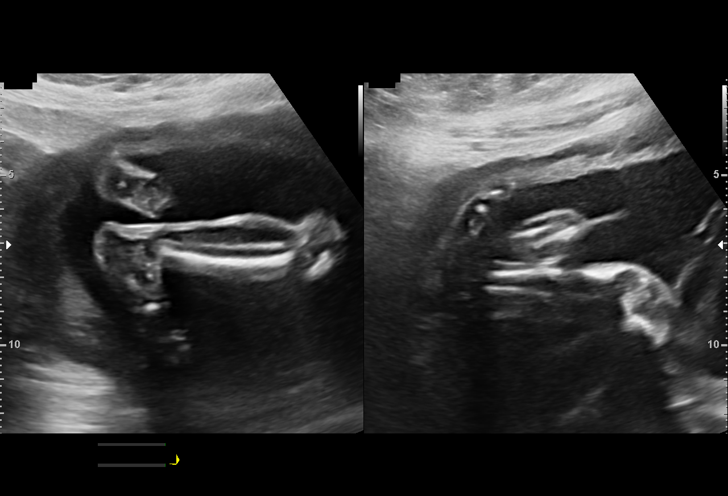
[im 69/93]
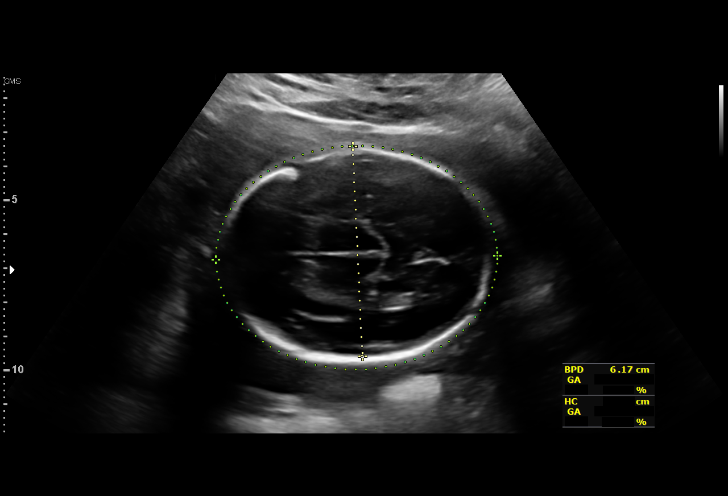
[im 75/93]
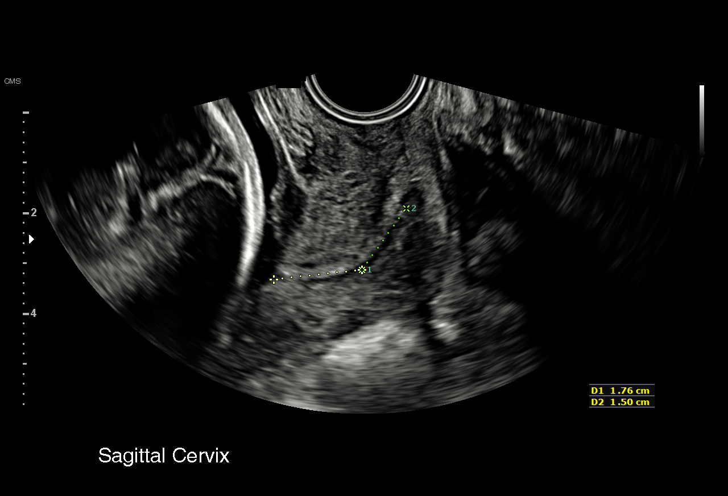
[im 82/93]
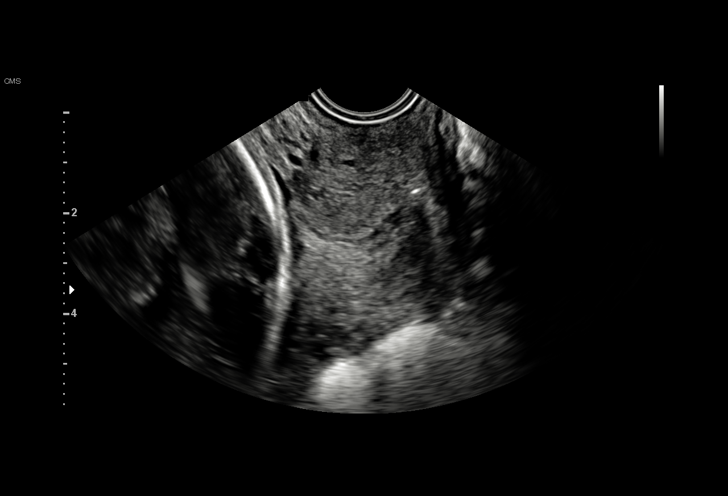
[im 89/93]
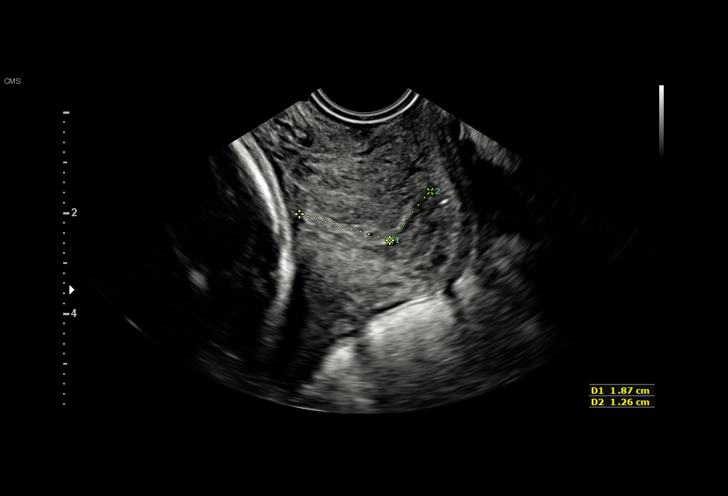

[13 of 28 positions shown; findings below may reference images not displayed]

OB/Gyn Clinic

1  FLOR IDALMA YAMAMOTO            [id number]      [id number]     [id number]
2  FLOR IDALMA YAMAMOTO            [id number]      [id number]     [id number]
Indications

25 weeks gestation of pregnancy
Encounter for fetal anatomic survey
Poor obstetric history: Previous preterm
delivery, antepartum (24 weeks - ND)
Encounter for cervical length
Obesity complicating pregnancy, second
trimester (pregravid BMI 42)
Previous cesarean delivery, antepartum
(classical)
OB History

Gravidity:    2         Term:   0        Prem:   1        SAB:   0
TOP:          0       Ectopic:  0        Living: 0
Fetal Evaluation

Num Of Fetuses:     1
Fetal Heart         161
Rate(bpm):
Cardiac Activity:   Observed
Presentation:       Cephalic
Placenta:           Posterior, above cervical os
P. Cord Insertion:  Visualized
Amniotic Fluid
AFI FV:      Subjectively within normal limits

Largest Pocket(cm)
4.42
Biometry

BPD:      61.5  mm     G. Age:  25w 0d         36  %    CI:        67.83   %    70 - 86
FL/HC:      18.6   %    18.7 -
HC:      238.9  mm     G. Age:  26w 0d         57  %    HC/AC:      1.20        1.04 -
AC:      198.4  mm     G. Age:  24w 4d         23  %    FL/BPD:     72.4   %    71 - 87
FL:       44.5  mm     G. Age:  24w 5d         23  %    FL/AC:      22.4   %    20 - 24
HUM:      42.5  mm     G. Age:  25w 4d         52  %
CER:        29  mm     G. Age:  26w 0d         62  %

CM:          3  mm

Est. FW:     727  gm    1 lb 10 oz      44  %
Gestational Age

LMP:           25w 1d        Date:  07/19/16                 EDD:   04/25/17
U/S Today:     25w 1d                                        EDD:   04/25/17
Best:          25w 1d     Det. By:  LMP  (07/19/16)          EDD:   04/25/17
Anatomy

Cranium:               Appears normal         Aortic Arch:            Appears normal
Cavum:                 Appears normal         Ductal Arch:            Not well visualized
Ventricles:            Appears normal         Diaphragm:              Appears normal
Choroid Plexus:        Appears normal         Stomach:                Appears normal, left
sided
Cerebellum:            Appears normal         Abdomen:                Appears normal
Posterior Fossa:       Appears normal         Abdominal Wall:         Appears nml (cord
insert, abd wall)
Nuchal Fold:           Not applicable (>20    Cord Vessels:           Appears normal (3
wks GA)                                        vessel cord)
Face:                  Appears normal         Kidneys:                Appear normal
(orbits and profile)
Lips:                  Appears normal         Bladder:                Appears normal
Thoracic:              Appears normal         Spine:                  Appears normal
Heart:                 Appears normal         Upper Extremities:      Appears normal
(4CH, axis, and
situs)
RVOT:                  Appears normal         Lower Extremities:      Appears normal
LVOT:                  Appears normal

Other:  Heels and 5th digit visualized. Nasal bone visualized. Technically
difficult due to maternal habitus and fetal position.
Cervix Uterus Adnexa

Cervix
Length:              3  cm.
Measured transvaginally.

Uterus
No abnormality visualized.
Left Ovary
Not visualized.

Right Ovary
Within normal limits.

Cul De Sac:   No free fluid seen.

Adnexa:       No abnormality visualized.
Impression

Single living intrauterine pregnancy at 25w 1d.
Placenta Posterior, above cervical os.
Appropriate fetal growth.
Normal amniotic fluid volume.
The ductal arch and fetal sex are not well visualized.
The fetal anatomic survey is otherwise complete.
Normal fetal anatomy.
No fetal anomalies or soft markers of aneuploidy seen.
The cervix measures 3cm transvaginally without funneling or
debris.
Recommendations

Recommend follow-up ultrasound examination in 6 weeks for
reassessment of fetal growth and anatomy.
No further ultrasounds for cervical length recommended at
this gestational age.

## 2019-03-06 IMAGING — US US OB COMP LESS 14 WK
1 series · 13 of 28 positions shown · non-contrast
Comparison: None.

CLINICAL DATA: Left adnexal pain in the first trimester pregnancy.



[Series 1: us ob comp less 14 wk · 0.25mm/px · 13 of 77 slices shown]
[im 3/77]
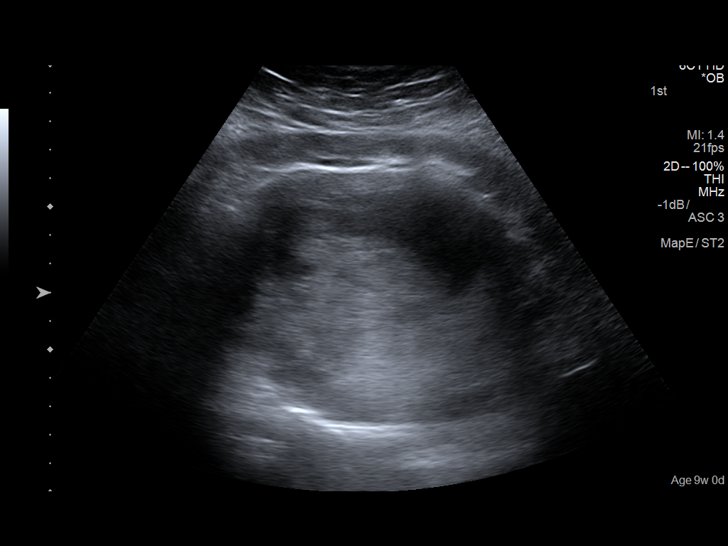
[im 9/77]
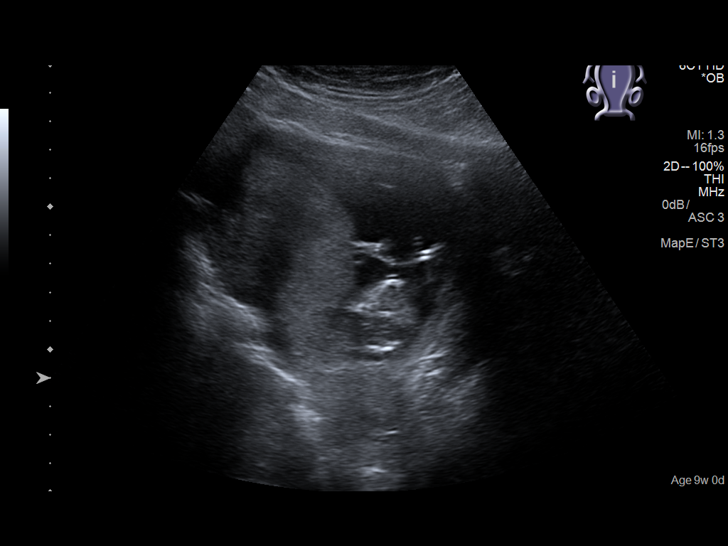
[im 15/77]
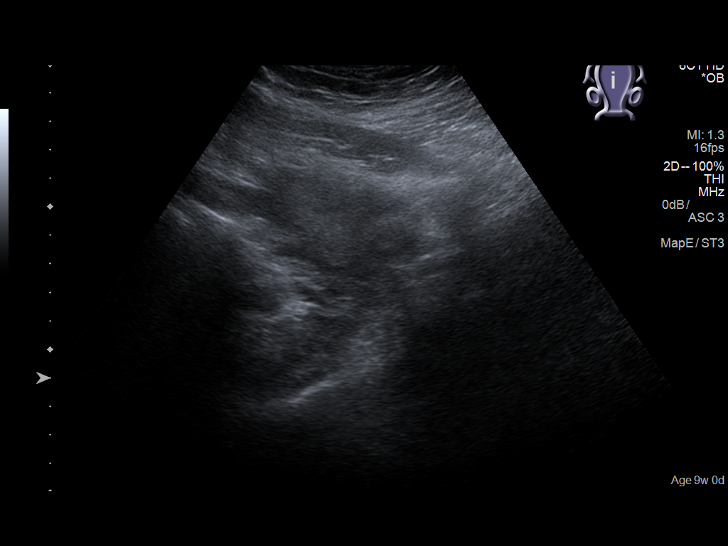
[im 20/77]
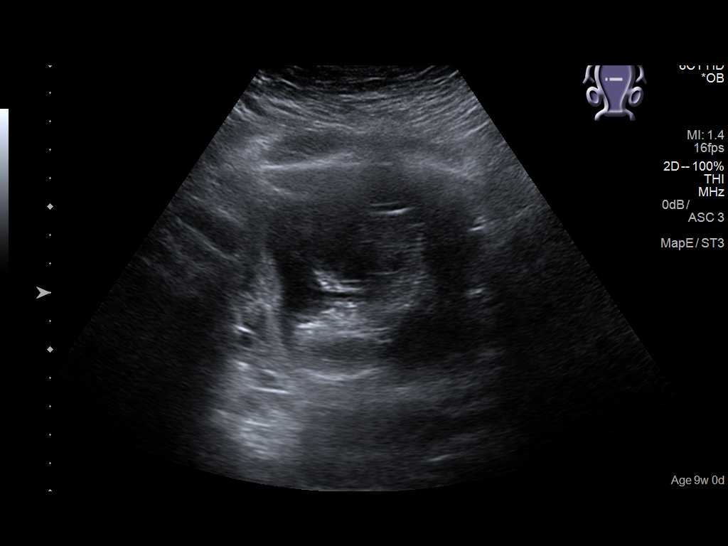
[im 26/77]
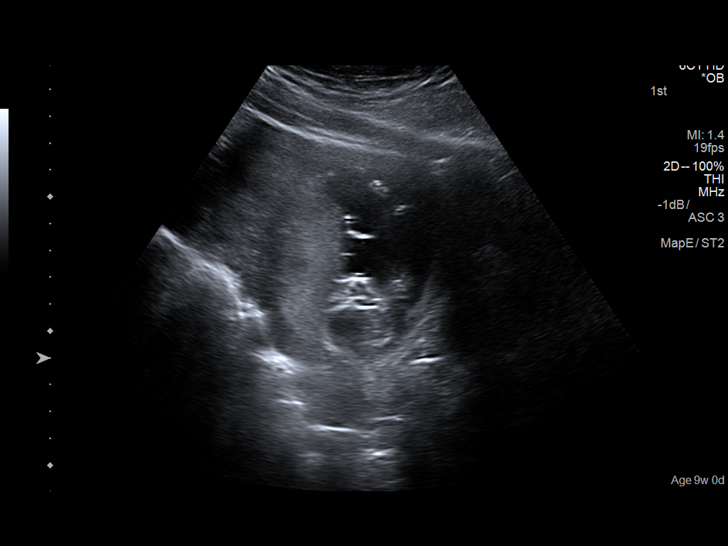
[im 31/77]
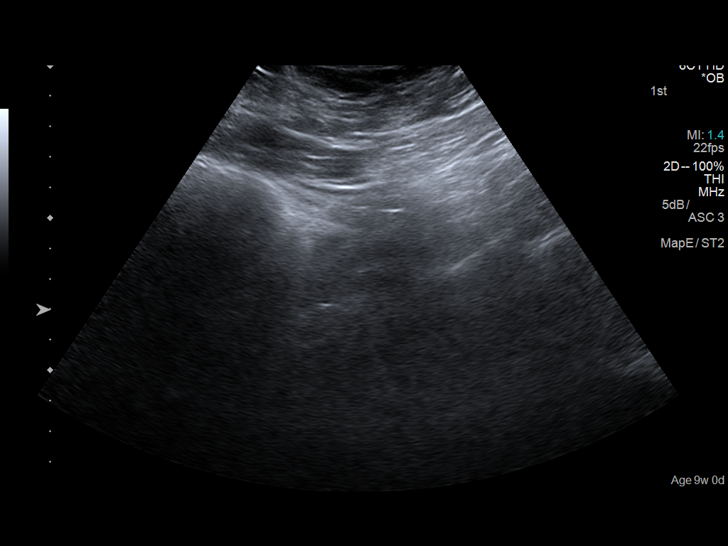
[im 40/77]
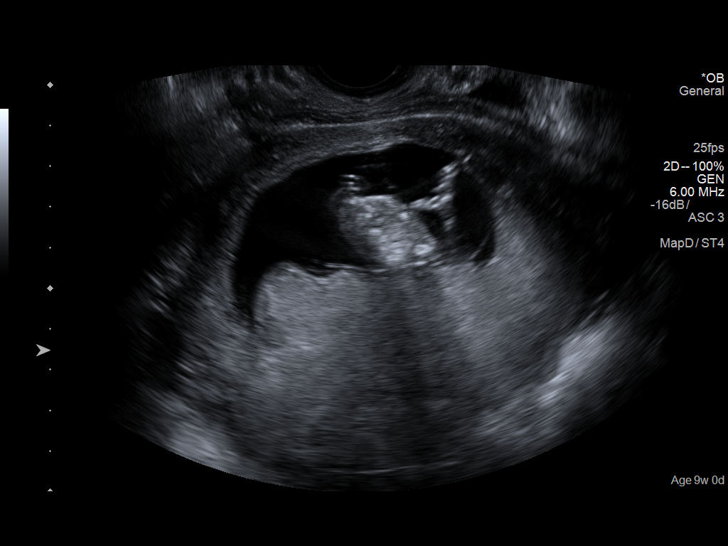
[im 46/77]
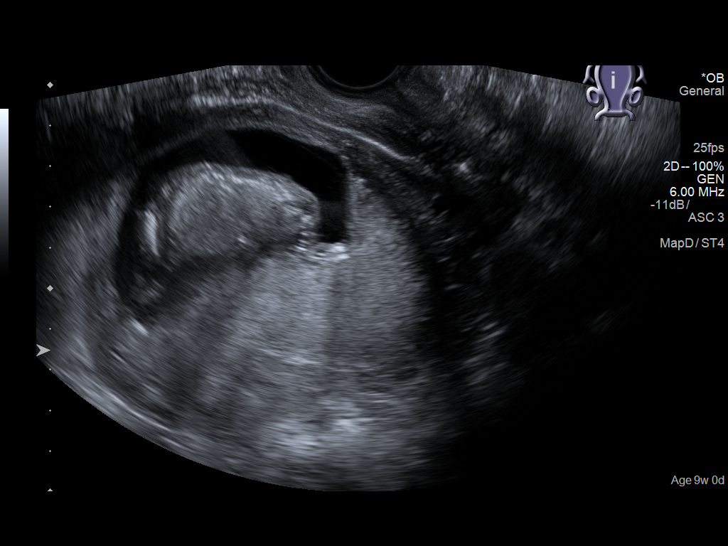
[im 51/77]
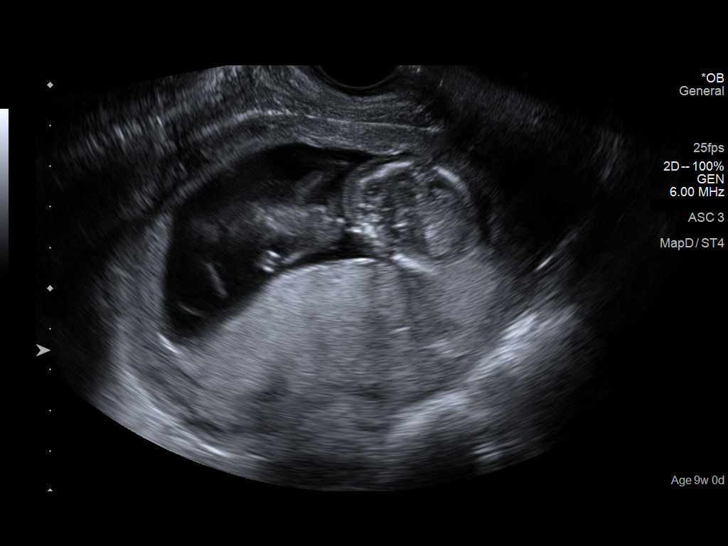
[im 57/77]
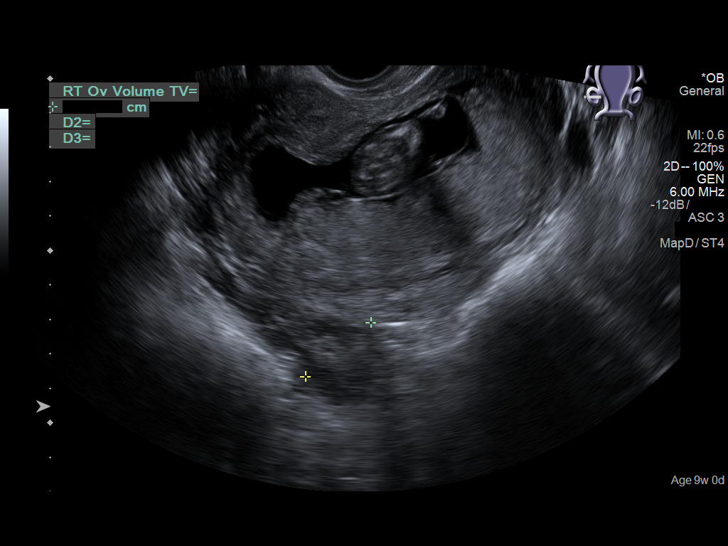
[im 62/77]
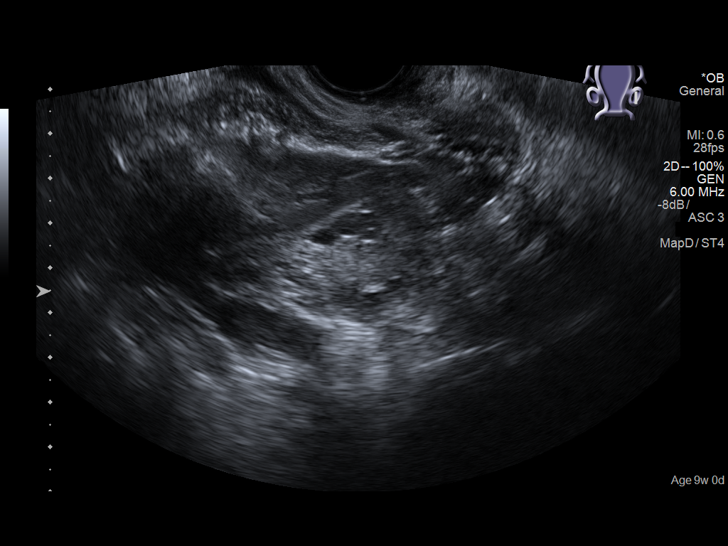
[im 68/77]
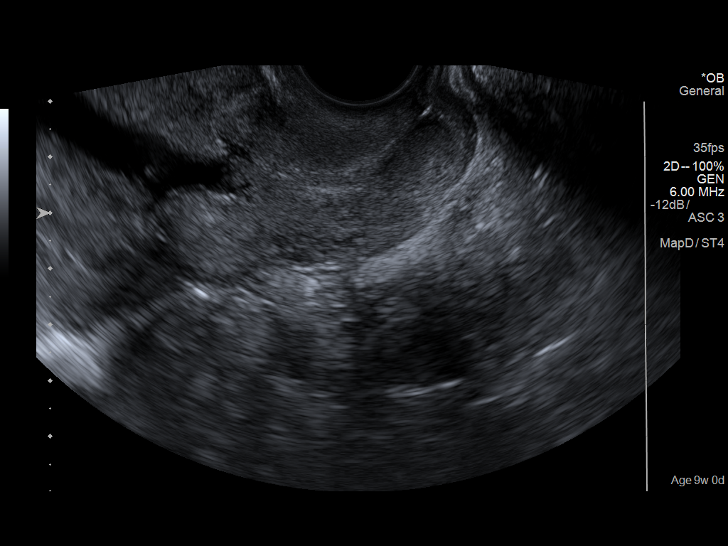
[im 74/77]
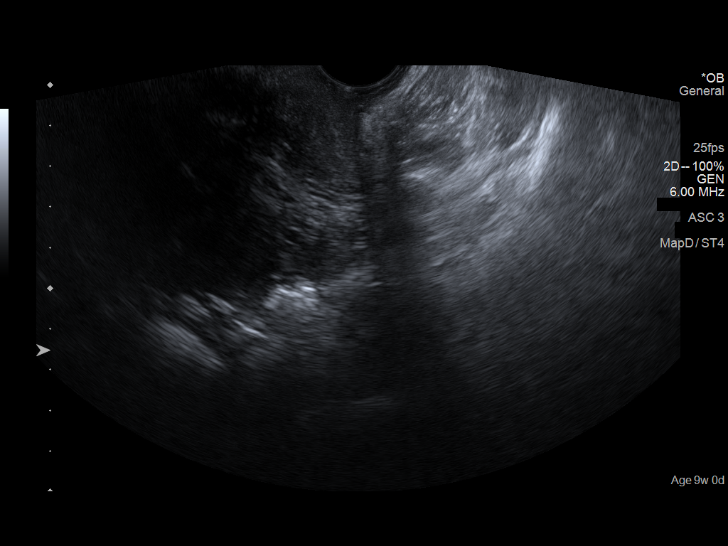

[13 of 28 positions shown; findings below may reference images not displayed]

FINDINGS: Intrauterine gestational sac: Single

Yolk sac:  Not visualized

Embryo:  Visualize

Cardiac Activity: Visualize

Heart Rate: 155 bpm

CRL:   74.5  mm   13 w 4 d                  US EDC: 04/25/2017

Subchorionic hemorrhage:  None visualized.

Maternal uterus/adnexae: Arterial and venous waveforms are
documented from both ovaries without evidence of torsion. The right
ovary measured 4.3 x 2.3 x 2.5 cm and the left ovary measured 4.2 x
2.6 x 1.9 cm. No free fluid.

Pulsed Doppler evaluation of both ovaries demonstrates normal
appearing low-resistance arterial and venous waveforms.
IMPRESSION: 1. Viable 13 week 4 day intrauterine gestation with ultrasound EDC
of 04/25/2017.
2. No ovarian torsion.  No adnexal mass.

## 2019-05-20 ENCOUNTER — Other Ambulatory Visit: Payer: Self-pay

## 2019-05-20 ENCOUNTER — Encounter: Payer: Self-pay | Admitting: Student

## 2019-05-20 ENCOUNTER — Ambulatory Visit (INDEPENDENT_AMBULATORY_CARE_PROVIDER_SITE_OTHER): Payer: Medicaid Other | Admitting: Student

## 2019-05-20 ENCOUNTER — Other Ambulatory Visit (HOSPITAL_COMMUNITY)
Admission: RE | Admit: 2019-05-20 | Discharge: 2019-05-20 | Disposition: A | Discharge status: Home / Self Care | Payer: Medicaid Other | Source: Ambulatory Visit | Attending: Student | Admitting: Student

## 2019-05-20 VITALS — BP 139/83 | HR 89 | Wt 311.0 lb

## 2019-05-20 DIAGNOSIS — Z720 Tobacco use: Secondary | ICD-10-CM

## 2019-05-20 DIAGNOSIS — Z0000 Encounter for general adult medical examination without abnormal findings: Secondary | ICD-10-CM | POA: Diagnosis not present

## 2019-05-20 DIAGNOSIS — Z01419 Encounter for gynecological examination (general) (routine) without abnormal findings: Secondary | ICD-10-CM

## 2019-05-20 DIAGNOSIS — Z6841 Body Mass Index (BMI) 40.0 and over, adult: Secondary | ICD-10-CM | POA: Insufficient documentation

## 2019-05-20 NOTE — Progress Notes (Signed)
GYNECOLOGY CLINIC ANNUAL PREVENTATIVE CARE ENCOUNTER NOTE  Subjective:   Melinda Richardson is a 24 y.o. [phone] female here for a routine annual gynecologic exam.  Current complaints: none. Denies abnormal vaginal bleeding, discharge, pelvic pain, problems with intercourse or other gynecologic concerns.    Gynecologic History No LMP recorded. Contraception: Nexplanon Last Pap: 2019. Results were: NIL with positive HPV Last mammogram: n/a due to age  Obstetric History OB History  Gravida Para Term Preterm AB Living  3 3 1 2  0 2  SAB TAB Ectopic Multiple Live Births  0 0 0 0 3    # Outcome Date GA Lbr Len/2nd Weight Sex Delivery Anes PTL Lv  3 Preterm 08/30/18 [redacted]w[redacted]d  5 lb 14.5 oz (2.679 kg) F CS-LTranv Spinal  LIV  2 Term 04/05/17 [redacted]w[redacted]d  6 lb 7.4 oz (2.93 kg) F CS-Vac Spinal  LIV  1 Preterm 12/17/14 [redacted]w[redacted]d  1 lb 7.6 oz (0.669 kg) M CS-High Vert Spinal  DEC     Birth Comments: extreme preterm; Died about 1 month of age    Past Medical History:  Diagnosis Date  . Anemia   . Anxiety   . Preterm labor   . Preterm premature rupture of membranes (PPROM) with unknown onset of labor 12/11/2014    Past Surgical History:  Procedure Laterality Date  . CESAREAN SECTION N/A 12/17/2014   Procedure: CESAREAN SECTION;  Surgeon: Jonnie Kind, MD;  Location: [facility];  Service: Obstetrics;  Laterality: N/A;  . CESAREAN SECTION N/A 04/05/2017   Procedure: CESAREAN SECTION;  Surgeon: Truett Mainland, DO;  Location: [facility];  Service: Obstetrics;  Laterality: N/A;  . CESAREAN SECTION N/A 08/30/2018   Procedure: CESAREAN SECTION;  Surgeon: Florian Buff, MD;  Location: [facility];  Service: Obstetrics;  Laterality: N/A;  . TONSILLECTOMY      Current Outpatient Medications on File Prior to Visit  Medication Sig Dispense Refill  . ibuprofen (ADVIL) 800 MG tablet Take 1 tablet (800 mg total) by mouth every 6 (six) hours. (Patient not taking: Reported on 05/20/2019) 30 tablet 0  .  oxyCODONE-acetaminophen (PERCOCET/ROXICET) 5-325 MG tablet Take 1 tablet by mouth every 6 (six) hours as needed for moderate pain or severe pain. (Patient not taking: Reported on 05/20/2019) 20 tablet 0   No current facility-administered medications on file prior to visit.    No Known Allergies  Social History   Socioeconomic History  . Marital status: Single    Spouse name: Not on file  . Number of children: Not on file  . Years of education: Not on file  . Highest education level: Not on file  Occupational History  . Not on file  Tobacco Use  . Smoking status: Current Every Day Smoker    Packs/day: 0.25    Types: Cigarettes  . Smokeless tobacco: Never Used  Substance and Sexual Activity  . Alcohol use: No  . Drug use: Yes    Types: Marijuana  . Sexual activity: Not Currently    Birth control/protection: None    Comment: desires Nexplanon  Other Topics Concern  . Not on file  Social History Narrative   ** Merged History Encounter **       Social Determinants of Health   Financial Resource Strain: Low Risk   . Difficulty of Paying Living Expenses: Not hard at all  Food Insecurity: No Food Insecurity  . Worried About Charity fundraiser in the Last Year: Never true  .  Ran Out of Food in the Last Year: Never true  Transportation Needs: Unknown  . Lack of Transportation (Medical): No  . Lack of Transportation (Non-Medical): Not on file  Physical Activity:   . Days of Exercise per Week: Not on file  . Minutes of Exercise per Session: Not on file  Stress: No Stress Concern Present  . Feeling of Stress : Not at all  Social Connections:   . Frequency of Communication with Friends and Family: Not on file  . Frequency of Social Gatherings with Friends and Family: Not on file  . Attends Religious Services: Not on file  . Active Member of Clubs or Organizations: Not on file  . Attends Banker Meetings: Not on file  . Marital Status: Not on file  Intimate  Partner Violence: Not At Risk  . Fear of Current or Ex-Partner: No  . Emotionally Abused: No  . Physically Abused: No  . Sexually Abused: No    Family History  Problem Relation Age of Onset  . Kidney failure Father   . Diabetes Mother     The following portions of the patient's history were reviewed and updated as appropriate: allergies, current medications, past family history, past medical history, past social history, past surgical history and problem list.  Review of Systems Pertinent items are noted in HPI.   Objective:  BP 139/83   Pulse 89   Wt (!) 311 lb (141.1 kg)   BMI 51.75 kg/m  CONSTITUTIONAL: Well-developed, well-nourished female in no acute distress.  HENT:  Normocephalic, atraumatic, External right and left ear normal. Oropharynx is clear and moist EYES: Conjunctivae and EOM are normal. Pupils are equal, round, and reactive to light. No scleral icterus.  NECK: Normal range of motion, supple, no masses.  Normal thyroid.  SKIN: Skin is warm and dry. No rash noted. Not diaphoretic. No erythema. No pallor. NEUROLGIC: Alert and oriented to person, place, and time. Normal reflexes, muscle tone coordination. No cranial nerve deficit noted. PSYCHIATRIC: Normal mood and affect. Normal behavior. Normal judgment and thought content. CARDIOVASCULAR: Normal heart rate noted, regular rhythm RESPIRATORY: Clear to auscultation bilaterally. Effort and breath sounds normal, no problems with respiration noted. BREASTS: Symmetric in size. No masses, skin changes, nipple drainage, or lymphadenopathy. ABDOMEN: Soft, normal bowel sounds, no distention noted.  No tenderness, rebound or guarding.  PELVIC: Normal appearing external genitalia; normal appearing vaginal mucosa and cervix.  No abnormal discharge noted.  Pap smear obtained.  Normal uterine size, no other palpable masses, no uterine or adnexal tenderness. MUSCULOSKELETAL: Normal range of motion. No tenderness.  No cyanosis,  clubbing, or edema.  2+ distal pulses.   Assessment:  Annual gynecologic examination with pap smear   Plan:  1. Encounter for annual routine gynecological examination -Pt currently does not have a PCP. Will check with medicaid to see if she's assigned to someone, if not will find a PCP for routine health maintenance.   - Cytology - PAP( [facility]) - Hepatitis B surface antigen - Hepatitis c antibody (reflex) - HIV Antibody (routine testing w rflx) - RPR - Lipid Profile - Hemoglobin A1c - CBC - Cervicovaginal ancillary only( [place])  2. Tobacco use The patient was counseled on the dangers of tobacco use, and was advised to quit.  Reviewed strategies to maximize success, including removing cigarettes and smoking materials from environment. Spent 3 minutes reviewing tobacco cessation.   3. Class 3 severe obesity without serious comorbidity with body mass index (BMI) of  50.0 to 59.9 in adult, unspecified obesity type [facility]) -Discussed dietary changes including monitoring calories/macros with app on her phone or programs such as weight watchers. Also discussed increasing activity.  -lipid profile & A1C checked today    Judeth Horn, NP

## 2019-05-20 NOTE — Addendum Note (Signed)
Addended by: Ernestina Patches on: 05/20/2019 03:02 PM   Modules accepted: Orders

## 2019-05-20 NOTE — Patient Instructions (Addendum)
Diabetes Mellitus and Nutrition, Adult When you have diabetes (diabetes mellitus), it is very important to have healthy eating habits because your blood sugar (glucose) levels are greatly affected by what you eat and drink. Eating healthy foods in the appropriate amounts, at about the same times every day, can help you:  Control your blood glucose.  Lower your risk of heart disease.  Improve your blood pressure.  Reach or maintain a healthy weight. Every person with diabetes is different, and each person has different needs for a meal plan. Your health care provider may recommend that you work with a diet and nutrition specialist (dietitian) to make a meal plan that is best for you. Your meal plan may vary depending on factors such as:  The calories you need.  The medicines you take.  Your weight.  Your blood glucose, blood pressure, and cholesterol levels.  Your activity level.  Other health conditions you have, such as heart or kidney disease. How do carbohydrates affect me? Carbohydrates, also called carbs, affect your blood glucose level more than any other type of food. Eating carbs naturally raises the amount of glucose in your blood. Carb counting is a method for keeping track of how many carbs you eat. Counting carbs is important to keep your blood glucose at a healthy level, especially if you use insulin or take certain oral diabetes medicines. It is important to know how many carbs you can safely have in each meal. This is different for every person. Your dietitian can help you calculate how many carbs you should have at each meal and for each snack. Foods that contain carbs include:  Bread, cereal, rice, pasta, and crackers.  Potatoes and corn.  Peas, beans, and lentils.  Milk and yogurt.  Fruit and juice.  Desserts, such as cakes, cookies, ice cream, and candy. How does alcohol affect me? Alcohol can cause a sudden decrease in blood glucose (hypoglycemia),  especially if you use insulin or take certain oral diabetes medicines. Hypoglycemia can be a life-threatening condition. Symptoms of hypoglycemia (sleepiness, dizziness, and confusion) are similar to symptoms of having too much alcohol. If your health care provider says that alcohol is safe for you, follow these guidelines:  Limit alcohol intake to no more than 1 drink per day for nonpregnant women and 2 drinks per day for men. One drink equals 12 oz of beer, 5 oz of wine, or 1 oz of hard liquor.  Do not drink on an empty stomach.  Keep yourself hydrated with water, diet soda, or unsweetened iced tea.  Keep in mind that regular soda, juice, and other mixers may contain a lot of sugar and must be counted as carbs. What are tips for following this plan?  Reading food labels  Start by checking the serving size on the "Nutrition Facts" label of packaged foods and drinks. The amount of calories, carbs, fats, and other nutrients listed on the label is based on one serving of the item. Many items contain more than one serving per package.  Check the total grams (g) of carbs in one serving. You can calculate the number of servings of carbs in one serving by dividing the total carbs by 15. For example, if a food has 30 g of total carbs, it would be equal to 2 servings of carbs.  Check the number of grams (g) of saturated and trans fats in one serving. Choose foods that have low or no amount of these fats.  Check the number of   milligrams (mg) of salt (sodium) in one serving. Most people should limit total sodium intake to less than 2,300 mg per day.  Always check the nutrition information of foods labeled as "low-fat" or "nonfat". These foods may be higher in added sugar or refined carbs and should be avoided.  Talk to your dietitian to identify your daily goals for nutrients listed on the label. Shopping  Avoid buying canned, premade, or processed foods. These foods tend to be high in fat, sodium,  and added sugar.  Shop around the outside edge of the grocery store. This includes fresh fruits and vegetables, bulk grains, fresh meats, and fresh dairy. Cooking  Use low-heat cooking methods, such as baking, instead of high-heat cooking methods like deep frying.  Cook using healthy oils, such as olive, canola, or sunflower oil.  Avoid cooking with butter, cream, or high-fat meats. Meal planning  Eat meals and snacks regularly, preferably at the same times every day. Avoid going long periods of time without eating.  Eat foods high in fiber, such as fresh fruits, vegetables, beans, and whole grains. Talk to your dietitian about how many servings of carbs you can eat at each meal.  Eat 4-6 ounces (oz) of lean protein each day, such as lean meat, chicken, fish, eggs, or tofu. One oz of lean protein is equal to: ? 1 oz of meat, chicken, or fish. ? 1 egg. ?  cup of tofu.  Eat some foods each day that contain healthy fats, such as avocado, nuts, seeds, and fish. Lifestyle  Check your blood glucose regularly.  Exercise regularly as told by your health care provider. This may include: ? 150 minutes of moderate-intensity or vigorous-intensity exercise each week. This could be brisk walking, biking, or water aerobics. ? Stretching and doing strength exercises, such as yoga or weightlifting, at least 2 times a week.  Take medicines as told by your health care provider.  Do not use any products that contain nicotine or tobacco, such as cigarettes and e-cigarettes. If you need help quitting, ask your health care provider.  Work with a Veterinary surgeon or diabetes educator to identify strategies to manage stress and any emotional and social challenges. Questions to ask a health care provider  Do I need to meet with a diabetes educator?  Do I need to meet with a dietitian?  What number can I call if I have questions?  When are the best times to check my blood glucose? Where to find more  information:  American Diabetes Association: diabetes.org  Academy of Nutrition and Dietetics: www.eatright.[employer] of Diabetes and Digestive and Kidney Diseases (NIH): CarFli[employer].tn Summary  A healthy meal plan will help you control your blood glucose and maintain a healthy lifestyle.  Working with a diet and nutrition specialist (dietitian) can help you make a meal plan that is best for you.  Keep in mind that carbohydrates (carbs) and alcohol have immediate effects on your blood glucose levels. It is important to count carbs and to use alcohol carefully. This information is not intended to replace advice given to you by your health care provider. Make sure you discuss any questions you have with your health care provider. Document Revised: 03/09/2017 Document Reviewed: 05/01/2016 Elsevier Patient Education  2020 [employer].      Health Maintenance, Female Adopting a healthy lifestyle and getting preventive care are important in promoting health and wellness. Ask your health care provider about:  The right schedule for you to have regular tests  and exams.  Things you can do on your own to prevent diseases and keep yourself healthy. What should I know about diet, weight, and exercise? Eat a healthy diet   Eat a diet that includes plenty of vegetables, fruits, low-fat dairy products, and lean protein.  Do not eat a lot of foods that are high in solid fats, added sugars, or sodium. Maintain a healthy weight Body mass index (BMI) is used to identify weight problems. It estimates body fat based on height and weight. Your health care provider can help determine your BMI and help you achieve or maintain a healthy weight. Get regular exercise Get regular exercise. This is one of the most important things you can do for your health. Most adults should:  Exercise for at least 150 minutes each week. The exercise should increase your heart rate and make you sweat  (moderate-intensity exercise).  Do strengthening exercises at least twice a week. This is in addition to the moderate-intensity exercise.  Spend less time sitting. Even light physical activity can be beneficial. Watch cholesterol and blood lipids Have your blood tested for lipids and cholesterol at 25 years of age, then have this test every 5 years. Have your cholesterol levels checked more often if:  Your lipid or cholesterol levels are high.  You are older than 25 years of age.  You are at high risk for heart disease. What should I know about cancer screening? Depending on your health history and family history, you may need to have cancer screening at various ages. This may include screening for:  Breast cancer.  Cervical cancer.  Colorectal cancer.  Skin cancer.  Lung cancer. What should I know about heart disease, diabetes, and high blood pressure? Blood pressure and heart disease  High blood pressure causes heart disease and increases the risk of stroke. This is more likely to develop in people who have high blood pressure readings, are of African descent, or are overweight.  Have your blood pressure checked: ? Every 3-5 years if you are 78-64 years of age. ? Every year if you are 16 years old or older. Diabetes Have regular diabetes screenings. This checks your fasting blood sugar level. Have the screening done:  Once every three years after age 32 if you are at a normal weight and have a low risk for diabetes.  More often and at a younger age if you are overweight or have a high risk for diabetes. What should I know about preventing infection? Hepatitis B If you have a higher risk for hepatitis B, you should be screened for this virus. Talk with your health care provider to find out if you are at risk for hepatitis B infection. Hepatitis C Testing is recommended for:  Everyone born from [birth year] through 1966.  Anyone with known risk factors for hepatitis  C. Sexually transmitted infections (STIs)  Get screened for STIs, including gonorrhea and chlamydia, if: ? You are sexually active and are younger than 25 years of age. ? You are older than 25 years of age and your health care provider tells you that you are at risk for this type of infection. ? Your sexual activity has changed since you were last screened, and you are at increased risk for chlamydia or gonorrhea. Ask your health care provider if you are at risk.  Ask your health care provider about whether you are at high risk for HIV. Your health care provider may recommend a prescription medicine to help prevent HIV infection.  If you choose to take medicine to prevent HIV, you should first get tested for HIV. You should then be tested every 3 months for as long as you are taking the medicine. Pregnancy  If you are about to stop having your period (premenopausal) and you may become pregnant, seek counseling before you get pregnant.  Take 400 to 800 micrograms (mcg) of folic acid every day if you become pregnant.  Ask for birth control (contraception) if you want to prevent pregnancy. Osteoporosis and menopause Osteoporosis is a disease in which the bones lose minerals and strength with aging. This can result in bone fractures. If you are 58 years old or older, or if you are at risk for osteoporosis and fractures, ask your health care provider if you should:  Be screened for bone loss.  Take a calcium or vitamin D supplement to lower your risk of fractures.  Be given hormone replacement therapy (HRT) to treat symptoms of menopause. Follow these instructions at home: Lifestyle  Do not use any products that contain nicotine or tobacco, such as cigarettes, e-cigarettes, and chewing tobacco. If you need help quitting, ask your health care provider.  Do not use street drugs.  Do not share needles.  Ask your health care provider for help if you need support or information about quitting  drugs. Alcohol use  Do not drink alcohol if: ? Your health care provider tells you not to drink. ? You are pregnant, may be pregnant, or are planning to become pregnant.  If you drink alcohol: ? Limit how much you use to 0-1 drink a day. ? Limit intake if you are breastfeeding.  Be aware of how much alcohol is in your drink. In the [place] one drink equals one 12 oz bottle of beer (355 mL), one 5 oz glass of wine (148 mL), or one 1 oz glass of hard liquor (44 mL). General instructions  Schedule regular health, dental, and eye exams.  Stay current with your vaccines.  Tell your health care provider if: ? You often feel depressed. ? You have ever been abused or do not feel safe at home. Summary  Adopting a healthy lifestyle and getting preventive care are important in promoting health and wellness.  Follow your health care provider's instructions about healthy diet, exercising, and getting tested or screened for diseases.  Follow your health care provider's instructions on monitoring your cholesterol and blood pressure. This information is not intended to replace advice given to you by your health care provider. Make sure you discuss any questions you have with your health care provider. Document Revised: 03/20/2018 Document Reviewed: 03/20/2018 Elsevier Patient Education  2020 Else[employer] Inc[employer] Calorie Counting for [employer] Calories are units of energy. Your body needs a certain amount of calories from food to keep you going throughout the day. When you eat more calories than your body needs, your body stores the extra calories as fat. When you eat fewer calories than your body needs, your body burns fat to get the energy it needs. Calorie counting means keeping track of how many calories you eat and drink each day. Calorie counting can be helpful if you need to lose weight. If you make sure to eat fewer calories than your body needs, you should lose weight. Ask your health care  provider what a healthy weight is for you. For calorie counting to work, you will need to eat the right number of calories in a day in order to lose a healthy amount  of weight per week. A dietitian can help you determine how many calories you need in a day and will give you suggestions on how to reach your calorie goal.  A healthy amount of weight to lose per week is usually 1-2 lb (0.5-0.9 kg). This usually means that your daily calorie intake should be reduced by 500-750 calories.  Eating 1,200 - 1,500 calories per day can help most women lose weight.  Eating 1,500 - 1,800 calories per day can help most men lose weight. What is my plan? My goal is to have __________ calories per day. If I have this many calories per day, I should lose around __________ pounds per week. What do I need to know about calorie counting? In order to meet your daily calorie goal, you will need to:  Find out how many calories are in each food you would like to eat. Try to do this before you eat.  Decide how much of the food you plan to eat.  Write down what you ate and how many calories it had. Doing this is called keeping a food log. To successfully lose weight, it is important to balance calorie counting with a healthy lifestyle that includes regular activity. Aim for 150 minutes of moderate exercise (such as walking) or 75 minutes of vigorous exercise (such as running) each week. Where do I find calorie information?  The number of calories in a food can be found on a Nutrition Facts label. If a food does not have a Nutrition Facts label, try to look up the calories online or ask your dietitian for help. Remember that calories are listed per serving. If you choose to have more than one serving of a food, you will have to multiply the calories per serving by the amount of servings you plan to eat. For example, the label on a package of bread might say that a serving size is 1 slice and that there are 90 calories in  a serving. If you eat 1 slice, you will have eaten 90 calories. If you eat 2 slices, you will have eaten 180 calories. How do I keep a food log? Immediately after each meal, record the following information in your food log:  What you ate. Don't forget to include toppings, sauces, and other extras on the food.  How much you ate. This can be measured in cups, ounces, or number of items.  How many calories each food and drink had.  The total number of calories in the meal. Keep your food log near you, such as in a small notebook in your pocket, or use a mobile app or website. Some programs will calculate calories for you and show you how many calories you have left for the day to meet your goal. What are some calorie counting tips?   Use your calories on foods and drinks that will fill you up and not leave you hungry: ? Some examples of foods that fill you up are nuts and nut butters, vegetables, lean proteins, and high-fiber foods like whole grains. High-fiber foods are foods with more than 5 g fiber per serving. ? Drinks such as sodas, specialty coffee drinks, alcohol, and juices have a lot of calories, yet do not fill you up.  Eat nutritious foods and avoid empty calories. Empty calories are calories you get from foods or beverages that do not have many vitamins or protein, such as candy, sweets, and soda. It is better to have a nutritious high-calorie food (  such as an avocado) than a food with few nutrients (such as a bag of chips).  Know how many calories are in the foods you eat most often. This will help you calculate calorie counts faster.  Pay attention to calories in drinks. Low-calorie drinks include water and unsweetened drinks.  Pay attention to nutrition labels for "low fat" or "fat free" foods. These foods sometimes have the same amount of calories or more calories than the full fat versions. They also often have added sugar, starch, or salt, to make up for flavor that was  removed with the fat.  Find a way of tracking calories that works for you. Get creative. Try different apps or programs if writing down calories does not work for you. What are some portion control tips?  Know how many calories are in a serving. This will help you know how many servings of a certain food you can have.  Use a measuring cup to measure serving sizes. You could also try weighing out portions on a kitchen scale. With time, you will be able to estimate serving sizes for some foods.  Take some time to put servings of different foods on your favorite plates, bowls, and cups so you know what a serving looks like.  Try not to eat straight from a bag or box. Doing this can lead to overeating. Put the amount you would like to eat in a cup or on a plate to make sure you are eating the right portion.  Use smaller plates, glasses, and bowls to prevent overeating.  Try not to multitask (for example, watch TV or use your computer) while eating. If it is time to eat, sit down at a table and enjoy your food. This will help you to know when you are full. It will also help you to be aware of what you are eating and how much you are eating. What are tips for following this plan? Reading food labels  Check the calorie count compared to the serving size. The serving size may be smaller than what you are used to eating.  Check the source of the calories. Make sure the food you are eating is high in vitamins and protein and low in saturated and trans fats. Shopping  Read nutrition labels while you shop. This will help you make healthy decisions before you decide to purchase your food.  Make a grocery list and stick to it. Cooking  Try to cook your favorite foods in a healthier way. For example, try baking instead of frying.  Use low-fat dairy products. Meal planning  Use more fruits and vegetables. Half of your plate should be fruits and vegetables.  Include lean proteins like poultry and  fish. How do I count calories when eating out?  Ask for smaller portion sizes.  Consider sharing an entree and sides instead of getting your own entree.  If you get your own entree, eat only half. Ask for a box at the beginning of your meal and put the rest of your entree in it so you are not tempted to eat it.  If calories are listed on the menu, choose the lower calorie options.  Choose dishes that include vegetables, fruits, whole grains, low-fat dairy products, and lean protein.  Choose items that are boiled, broiled, grilled, or steamed. Stay away from items that are buttered, battered, fried, or served with cream sauce. Items labeled "crispy" are usually fried, unless stated otherwise.  Choose water, low-fat milk, unsweetened iced  tea, or other drinks without added sugar. If you want an alcoholic beverage, choose a lower calorie option such as a glass of wine or light beer.  Ask for dressings, sauces, and syrups on the side. These are usually high in calories, so you should limit the amount you eat.  If you want a salad, choose a garden salad and ask for grilled meats. Avoid extra toppings like bacon, cheese, or fried items. Ask for the dressing on the side, or ask for olive oil and vinegar or lemon to use as dressing.  Estimate how many servings of a food you are given. For example, a serving of cooked rice is  cup or about the size of half a baseball. Knowing serving sizes will help you be aware of how much food you are eating at restaurants. The list below tells you how big or small some common portion sizes are based on everyday objects: ? 1 oz--4 stacked dice. ? 3 oz--1 deck of cards. ? 1 tsp--1 die. ? 1 Tbsp-- a ping-pong ball. ? 2 Tbsp--1 ping-pong ball. ?  cup-- baseball. ? 1 cup--1 baseball. Summary  Calorie counting means keeping track of how many calories you eat and drink each day. If you eat fewer calories than your body needs, you should lose weight.  A  healthy amount of weight to lose per week is usually 1-2 lb (0.5-0.9 kg). This usually means reducing your daily calorie intake by 500-750 calories.  The number of calories in a food can be found on a Nutrition Facts label. If a food does not have a Nutrition Facts label, try to look up the calories online or ask your dietitian for help.  Use your calories on foods and drinks that will fill you up, and not on foods and drinks that will leave you hungry.  Use smaller plates, glasses, and bowls to prevent overeating. This information is not intended to replace advice given to you by your health care provider. Make sure you discuss any questions you have with your health care provider. Document Revised: 12/14/2017 Document Reviewed: 02/25/2016 Elsevier Patient Education  2020 [employer].

## 2019-05-21 LAB — CBC
Hematocrit: 34.4 % (ref 34.0–46.6)
Hemoglobin: 11 g/dL — ABNORMAL LOW (ref 11.1–15.9)
MCH: 26.3 pg — ABNORMAL LOW (ref 26.6–33.0)
MCHC: 32 g/dL (ref 31.5–35.7)
MCV: 82 fL (ref 79–97)
Platelets: 328 10*3/uL (ref 150–450)
RBC: 4.18 x10E6/uL (ref 3.77–5.28)
RDW: 14.5 % (ref 11.7–15.4)
WBC: 7.6 10*3/uL (ref 3.4–10.8)

## 2019-05-21 LAB — HIV ANTIBODY (ROUTINE TESTING W REFLEX): HIV Screen 4th Generation wRfx: NONREACTIVE

## 2019-05-21 LAB — LIPID PANEL
Chol/HDL Ratio: 5.1 ratio — ABNORMAL HIGH (ref 0.0–4.4)
Cholesterol, Total: 172 mg/dL (ref 100–199)
HDL: 34 mg/dL — ABNORMAL LOW (ref >39)
LDL Chol Calc (NIH): 121 mg/dL — ABNORMAL HIGH (ref 0–99)
Triglycerides: 91 mg/dL (ref 0–149)
VLDL Cholesterol Cal: 17 mg/dL (ref 5–40)

## 2019-05-21 LAB — HEPATITIS B SURFACE ANTIGEN: Hepatitis B Surface Ag: NEGATIVE

## 2019-05-21 LAB — CYTOLOGY - PAP
Chlamydia: NEGATIVE
Comment: NEGATIVE
Comment: NORMAL
Diagnosis: NEGATIVE
Neisseria Gonorrhea: NEGATIVE

## 2019-05-21 LAB — HEPATITIS C ANTIBODY (REFLEX): HCV Ab: <0.1 s/co ratio (ref 0.0–0.9)

## 2019-05-21 LAB — HEMOGLOBIN A1C
Est. average glucose Bld gHb Est-mCnc: 163 mg/dL
Hgb A1c MFr Bld: 7.3 % — ABNORMAL HIGH (ref 4.8–5.6)

## 2019-05-21 LAB — RPR: RPR Ser Ql: NONREACTIVE

## 2019-05-21 LAB — HCV COMMENT:

## 2019-06-02 ENCOUNTER — Other Ambulatory Visit: Payer: Self-pay | Admitting: Student

## 2019-06-02 DIAGNOSIS — E119 Type 2 diabetes mellitus without complications: Secondary | ICD-10-CM

## 2019-06-23 ENCOUNTER — Other Ambulatory Visit: Payer: Self-pay

## 2019-12-23 ENCOUNTER — Ambulatory Visit: Payer: Medicaid Other | Admitting: Internal Medicine

## 2020-03-13 IMAGING — US US MFM OB DETAIL+14 WK
2 series · 13 of 28 positions shown · non-contrast
Comparison: none

[Series 1: us mfm ob detail+14 wk · 74 acquisitions, 11 frames shown (1 of 2)]
[im 4/74]
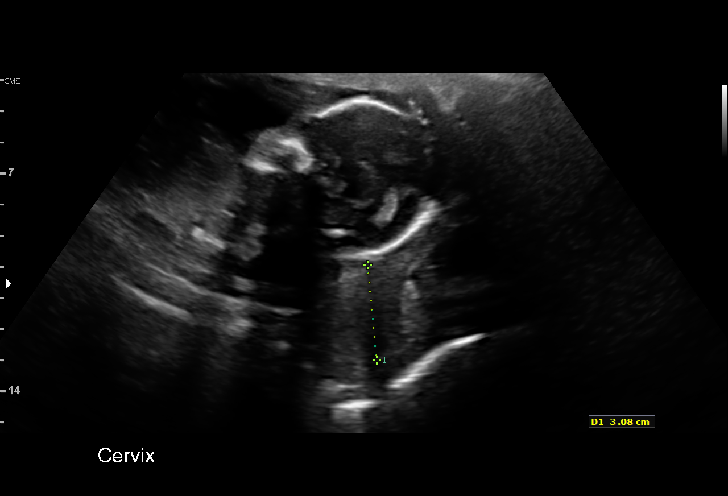
[im 10/74]
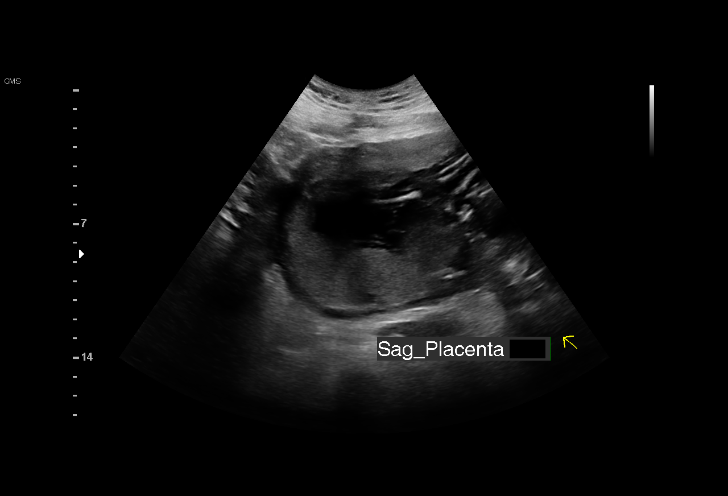
[im 16/74]
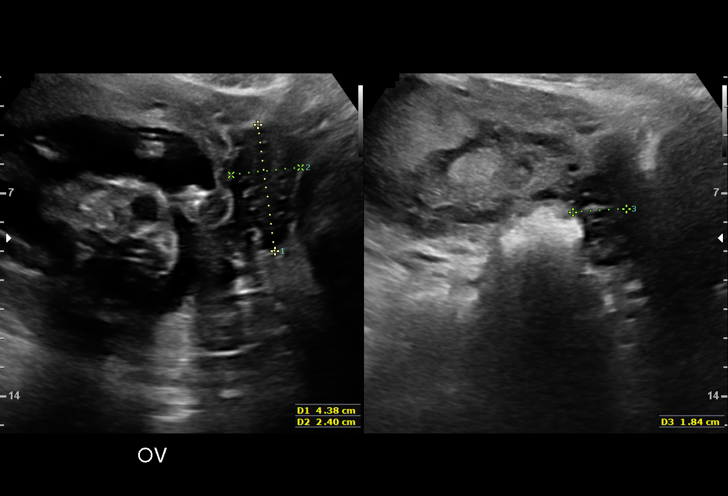
[im 23/74]
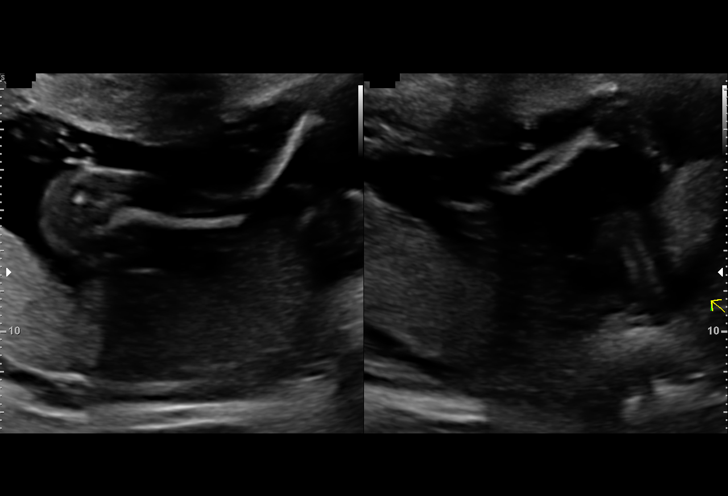
[im 29/74]
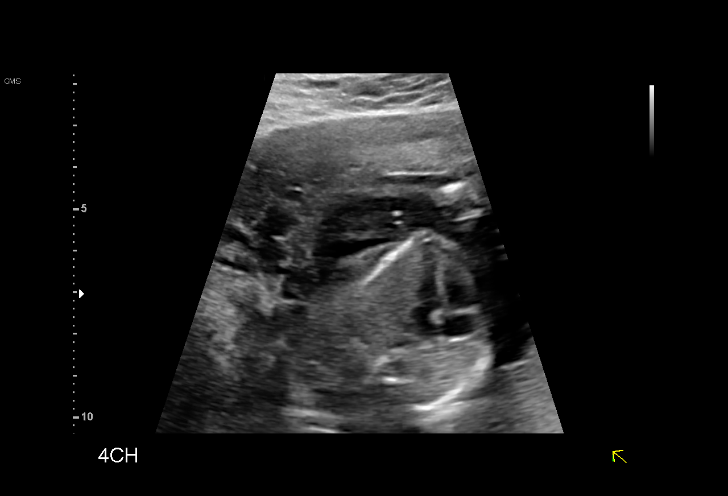
[im 35/74]
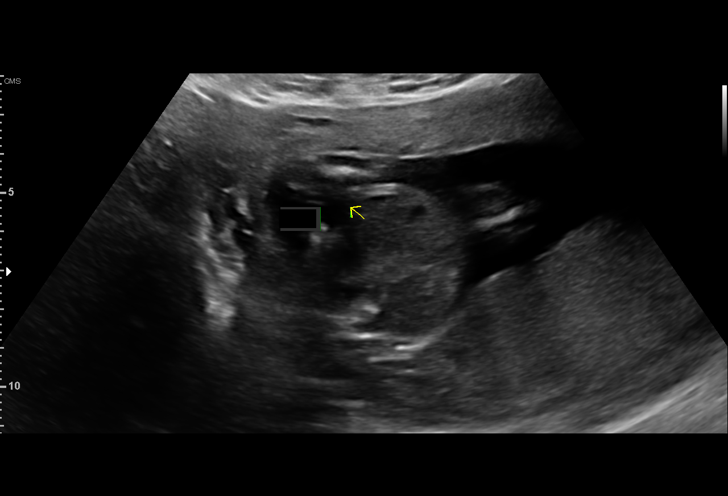
[im 45/74]
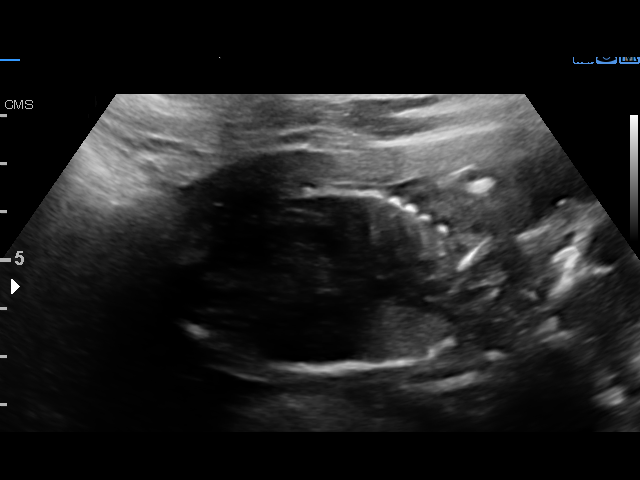
[im 51/74]
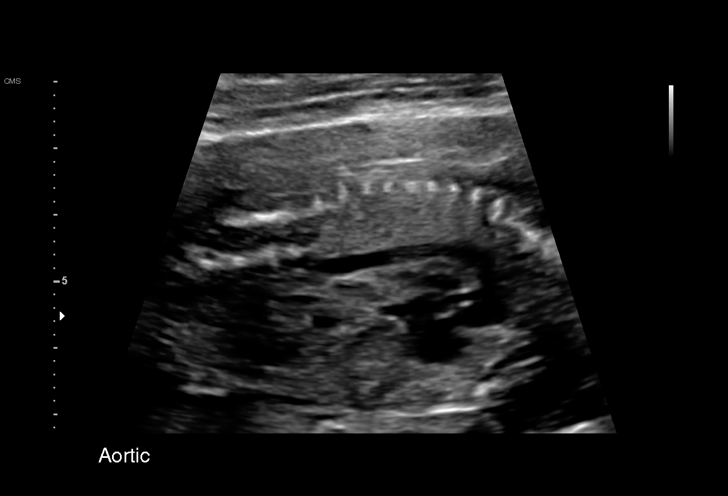
[im 58/74]
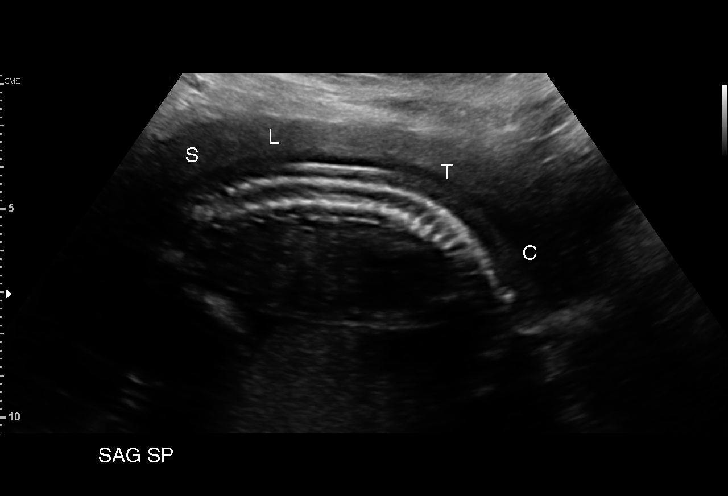
[im 64/74]
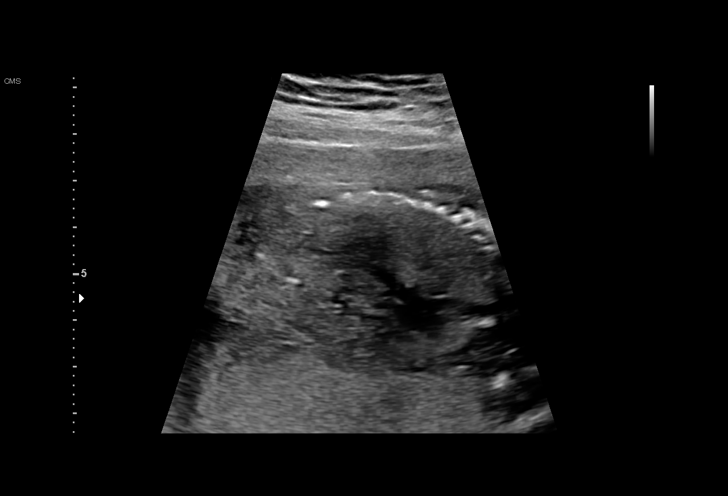
[im 70/74]
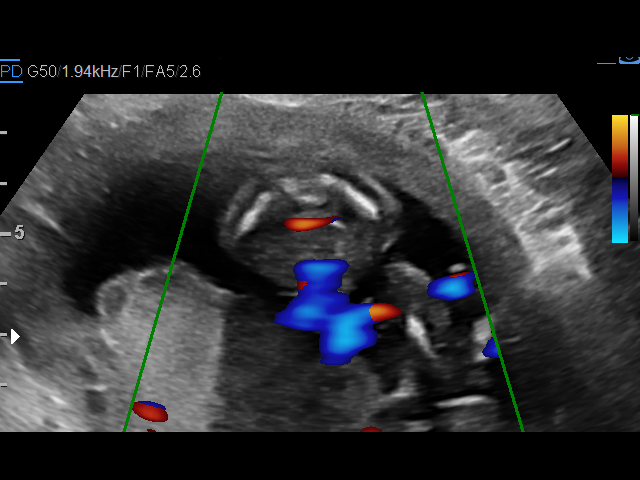

[Series 2: us mfm ob detail+14 wk · 2 of 13 slices shown (2 of 2)]
[im 1/13]
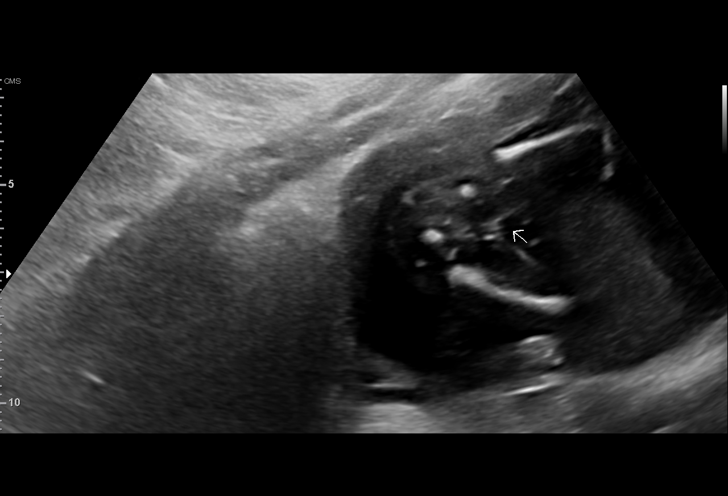
[im 9/13]
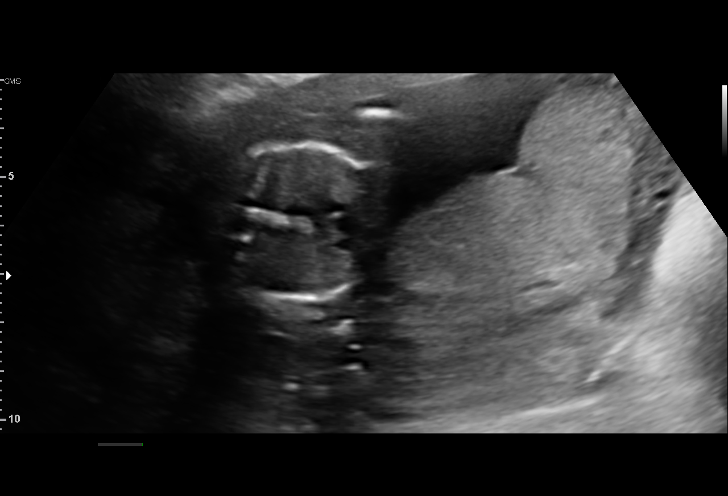

[13 of 28 positions shown; findings below may reference images not displayed]

[facility]/Gyn Clinic

 ----------------------------------------------------------------------

 ----------------------------------------------------------------------
Indications

  Encounter for antenatal screening for
  malformations
  Poor obstetric history: Previous preterm
  delivery, antepartum (PPROM 24 weeks -
  ND)
  Previous cesarean delivery, antepartum (1
  classical)
  Obesity complicating pregnancy, second
  trimester (pregravid BMI 41)
  19 weeks gestation of pregnancy
 ----------------------------------------------------------------------
Fetal Evaluation

 Num Of Fetuses:          1
 Fetal Heart Rate(bpm):   165
 Cardiac Activity:        Observed
 Presentation:            Cephalic
 Placenta:                Left posterior
 P. Cord Insertion:       Visualized

 Amniotic Fluid
 AFI FV:      Within normal limits

                             Largest Pocket(cm)

Biometry
 BPD:      42.4  mm     G. Age:  18w 6d         43  %    CI:        68.32   %    70 - 86
                                                         FL/HC:       17.6  %    16.1 -
 HC:       164   mm     G. Age:  19w 1d         50  %    HC/AC:       1.19       1.09 -
 AC:      138.3  mm     G. Age:  19w 2d         54  %    FL/BPD:      68.2  %
 FL:       28.9  mm     G. Age:  18w 6d         39  %    FL/AC:       20.9  %    20 - 24
 HUM:      29.7  mm     G. Age:  19w 5d         69  %

 Est. FW:     272   gm   0 lb 10 oz      46  %
OB History

 Gravidity:    3         Term:   1        Prem:   1        SAB:   0
 TOP:          0       Ectopic:  0        Living: 1
Gestational Age

 LMP:           19w 0d        Date:  12/27/17                 EDD:   10/03/18
 U/S Today:     19w 0d                                        EDD:   10/03/18
 Best:          19w 0d     Det. By:  LMP  (12/27/17)          EDD:   10/03/18
Anatomy

 Cranium:               Appears normal         LVOT:                   Appears normal
 Cavum:                 Not well visualized    Aortic Arch:            Appears normal
 Ventricles:            Appears normal         Ductal Arch:            Not well visualized
 Choroid Plexus:        Appears normal         Diaphragm:              Appears normal
 Cerebellum:            Appears normal         Stomach:                Appears normal, left
                                                                       sided
 Posterior Fossa:       Appears normal         Abdomen:                Appears normal
 Nuchal Fold:           Appears normal         Abdominal Wall:         Not well visualized
 Face:                  Appears normal         Cord Vessels:           Not well visualized
                        (orbits and profile)
 Lips:                  Not well visualized    Kidneys:                Appear normal
 Palate:                Not well visualized    Bladder:                Appears normal
 Thoracic:              Appears normal         Spine:                  Limited views
                                                                       appear normal
 Heart:                 Appears normal         Upper Extremities:      Visualized
                        (4CH, axis, and
                        situs)
 RVOT:                  Appears normal         Lower Extremities:      Visualized

 Other:  Technically difficult due to maternal habitus and fetal position.
Cervix Uterus Adnexa

 Cervix
 Length:            3.1  cm.
 Normal appearance by transabdominal scan.

 Uterus
 No abnormality visualized.

 Left Ovary
 Within normal limits.

 Right Ovary
 Within normal limits.
 Cul De Sac
 No free fluid seen.

 Adnexa
 No abnormality visualized.
Impression

 Normal interval growth.  No ultrasonic evidence of structural
 fetal anomalies.
 Suboptimal views of the fetal anatomy were obtained.
Recommendations

 Follow up anatomy in 4 weeks.

## 2020-06-12 IMAGING — US US MFM OB FOLLOW UP
1 series · 14 of 28 positions shown · non-contrast
Comparison: none

[Series 1: us mfm ob follow up · 36 acquisitions, 14 frames shown]
[im 2/36]
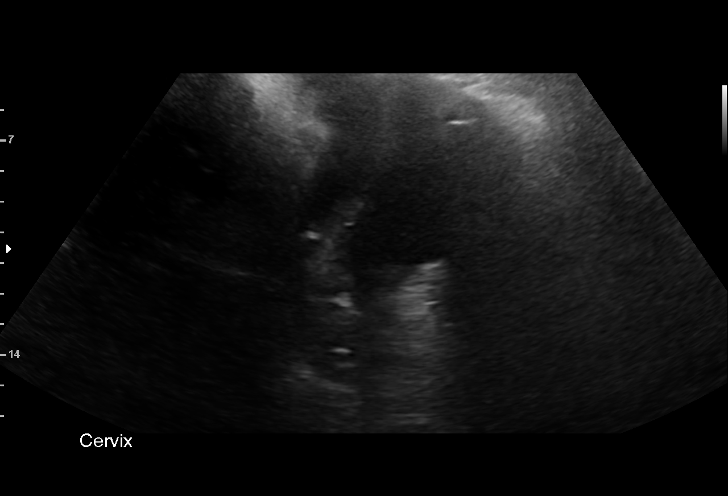
[im 4/36]
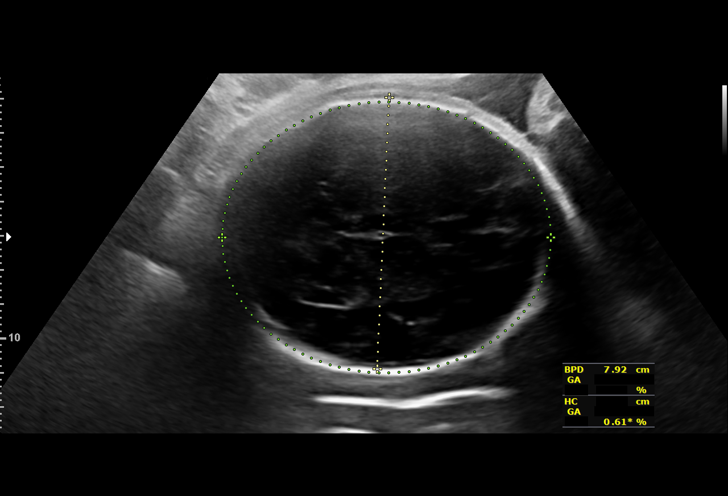
[im 7/36]
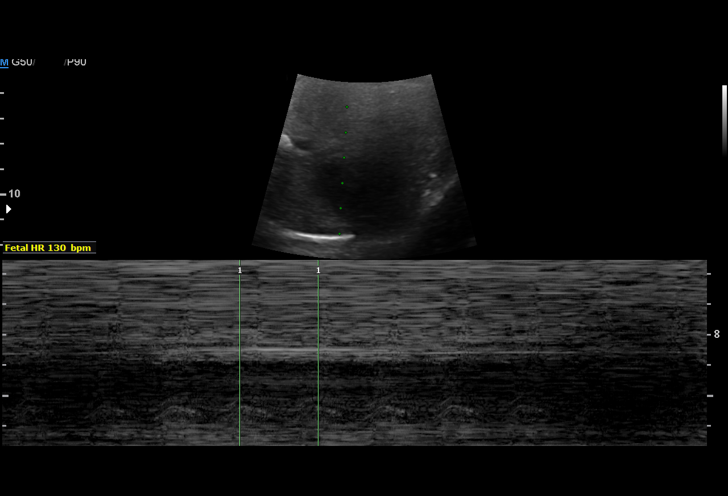
[im 10/36]
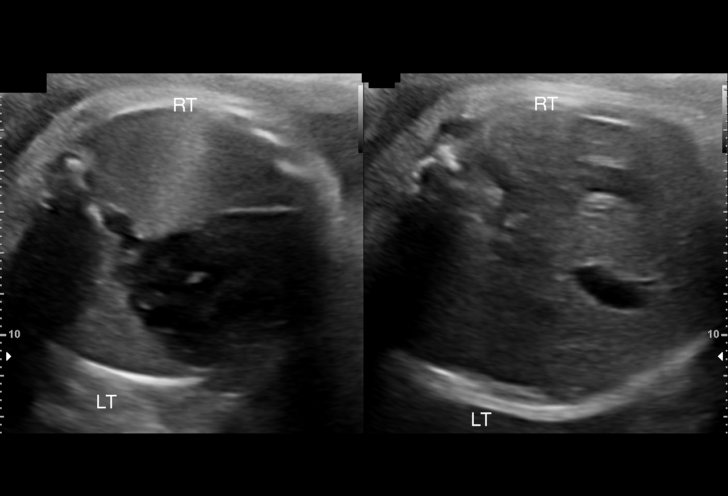
[im 12/36]
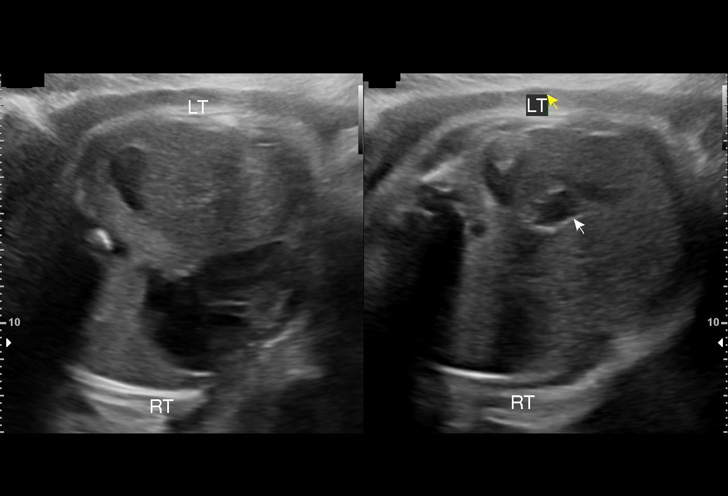
[im 15/36]
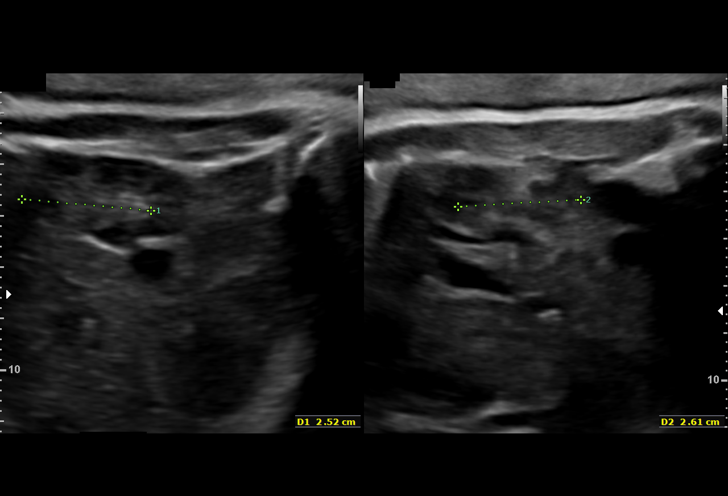
[im 17/36]
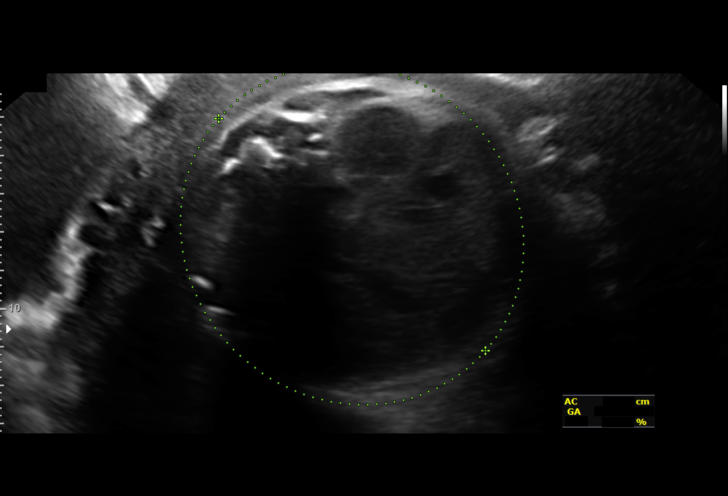
[im 20/36]
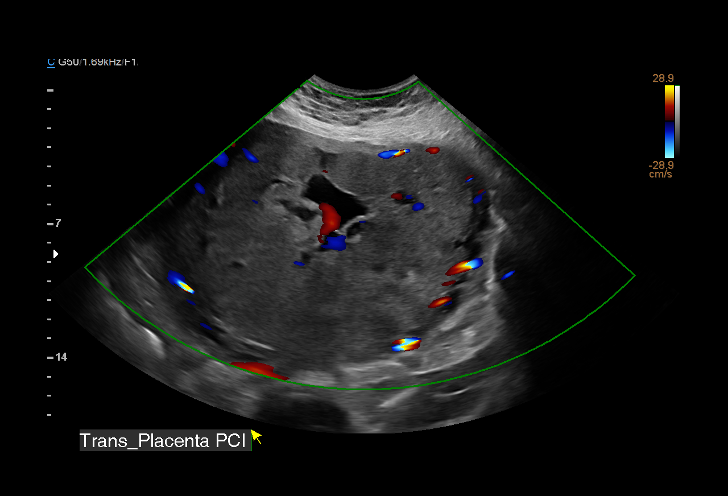
[im 23/36]
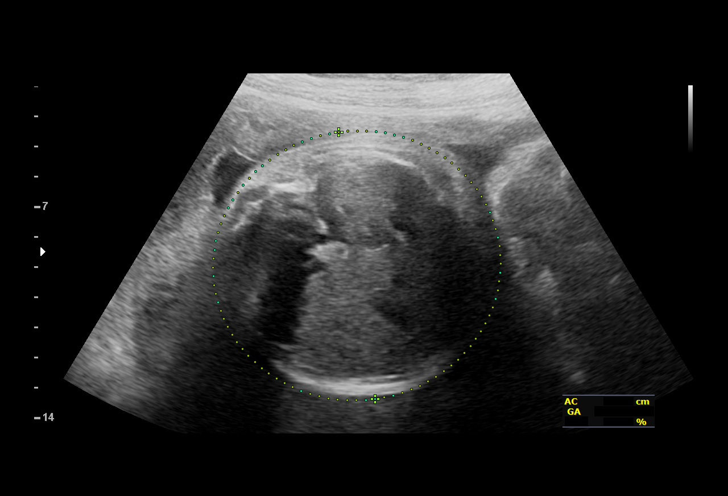
[im 25/36]
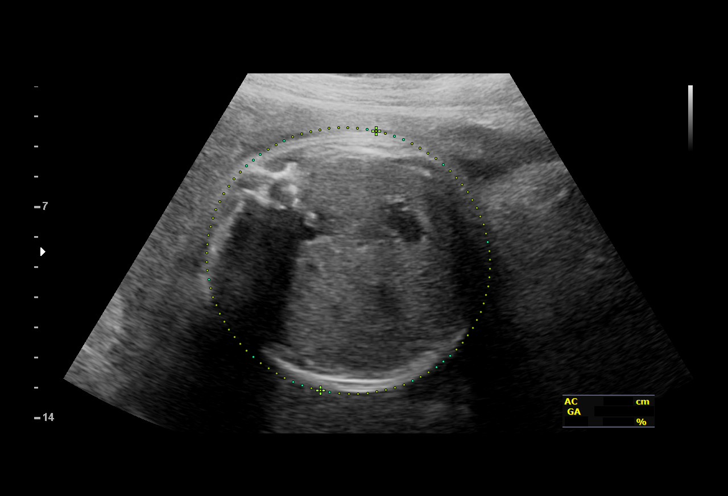
[im 28/36]
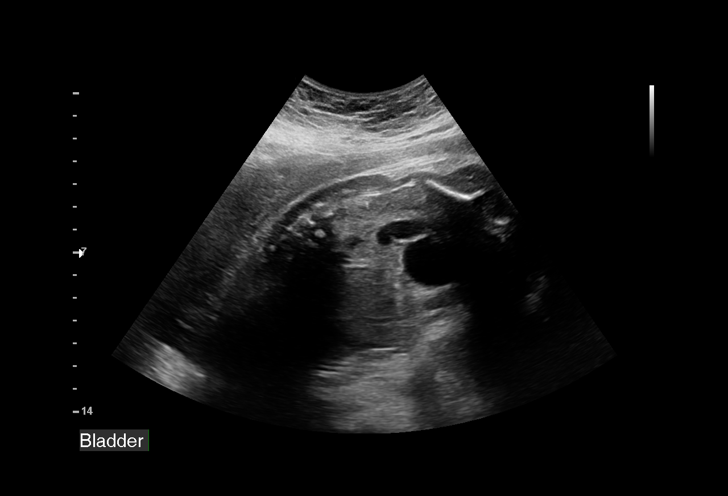
[im 30/36]
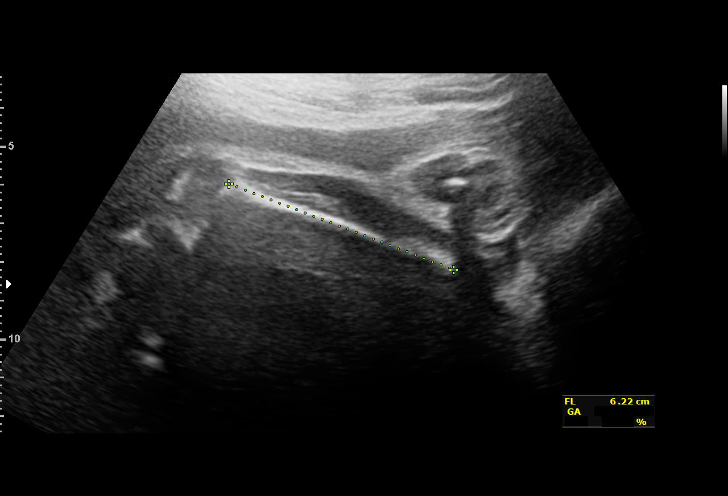
[im 33/36]
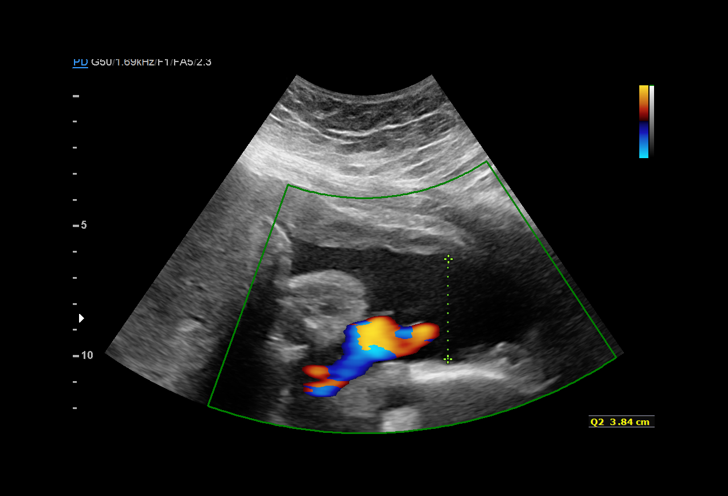
[im 36/36]
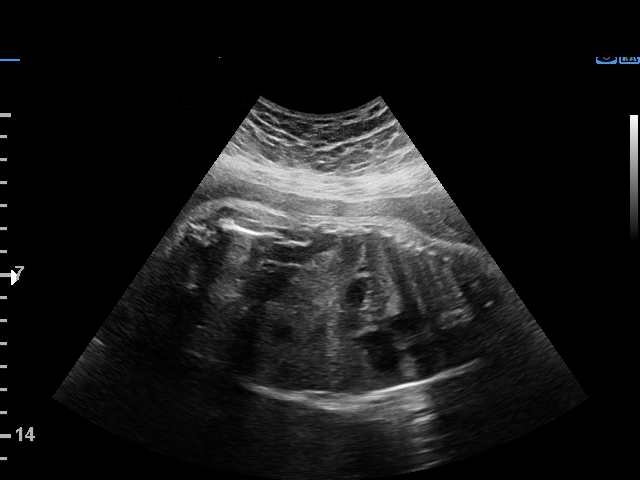

[14 of 28 positions shown; findings below may reference images not displayed]

[address]

                                                       [place]
 ----------------------------------------------------------------------

 ----------------------------------------------------------------------
Indications

  Poor obstetric history: Previous preterm
  delivery, antepartum (PPROM 24 weeks -
  ND)
  Previous cesarean delivery, antepartum (1
  classical)
  Obesity complicating pregnancy, second
  trimester (pregravid BMI 41)
  Encounter for other antenatal screening
  follow-up (denied genetic screening)
  32 weeks gestation of pregnancy
 ----------------------------------------------------------------------
Vital Signs

 BMI:
Fetal Evaluation

 Num Of Fetuses:         1
 Fetal Heart Rate(bpm):  130
 Cardiac Activity:       Observed
 Presentation:           Cephalic
 Placenta:               Posterior
 P. Cord Insertion:      Previously Visualized

 Amniotic Fluid
 AFI FV:      Within normal limits

 AFI Sum(cm)     %Tile       Largest Pocket(cm)
 10.78           22

 RUQ(cm)       RLQ(cm)       LUQ(cm)        LLQ(cm)

Biometry

 BPD:      79.1  mm     G. Age:  31w 5d         33  %    CI:        79.65   %    70 - 86
                                                         FL/HC:      22.7   %    19.1 -
 HC:      280.1  mm     G. Age:  30w 5d        < 3  %    HC/AC:      0.97        0.96 -
 AC:      289.8  mm     G. Age:  33w 0d         76  %    FL/BPD:     80.5   %    71 - 87
 FL:       63.7  mm     G. Age:  33w 0d         63  %    FL/AC:      22.0   %    20 - 24

 Est. FW:    9797  gm      4 lb 7 oz     68  %
OB History

 Gravidity:    3         Term:   1        Prem:   1        SAB:   0
 TOP:          0       Ectopic:  0        Living: 1
Gestational Age

 LMP:           32w 0d        Date:  12/27/17                 EDD:   10/03/18
 U/S Today:     32w 1d                                        EDD:   10/02/18
 Best:          32w 0d     Det. By:  LMP  (12/27/17)          EDD:   10/03/18
Anatomy

 Cranium:               Appears normal         Aortic Arch:            Previously seen
 Cavum:                 Appears normal         Ductal Arch:            Not well visualized
 Ventricles:            Previously seen        Diaphragm:              Previously seen
 Choroid Plexus:        Previously seen        Stomach:                Appears normal, left
                                                                       sided
 Cerebellum:            Previously seen        Abdomen:                Previously seen
 Posterior Fossa:       Previously seen        Abdominal Wall:         Previously seen
 Nuchal Fold:           Previously seen        Cord Vessels:           Previously seen
 Face:                  Orbits and profile     Kidneys:                Appear normal
                        previously seen
 Lips:                  Previously seen        Bladder:                Appears normal
 Thoracic:              Appears normal         Spine:                  Limited views
                                                                       appear normal prev.
 Heart:                 Previously seen        Upper Extremities:      Previously seen
 RVOT:                  Previously seen        Lower Extremities:      Previously seen
 LVOT:                  Previously seen

 Other:  Technically difficult due to maternal habitus and fetal position.
Cervix Uterus Adnexa

 Cervix
 Not visualized (advanced GA >91wks)
Impression

 Amniotic fluid is normal and good fetal activity is seen. Fetal
 growth is appropriate for gestational age.
Recommendations

 Follow-up as clinically indicated.
                 Pride, Senait

## 2020-06-23 ENCOUNTER — Encounter: Payer: Medicaid Other | Admitting: Family

## 2020-06-23 NOTE — Progress Notes (Signed)
Patient did not show for appointment.   

## 2020-08-15 NOTE — Progress Notes (Signed)
Patient did not show for appointment.   

## 2020-08-16 ENCOUNTER — Encounter: Payer: Medicaid Other | Admitting: Family

## 2020-08-16 DIAGNOSIS — Z7689 Persons encountering health services in other specified circumstances: Secondary | ICD-10-CM

## 2020-11-04 ENCOUNTER — Ambulatory Visit (HOSPITAL_COMMUNITY)
Admission: EM | Admit: 2020-11-04 | Discharge: 2020-11-04 | Disposition: A | Discharge status: Home / Self Care | Payer: Medicaid Other

## 2020-11-04 ENCOUNTER — Other Ambulatory Visit: Payer: Self-pay

## 2020-11-04 ENCOUNTER — Encounter (HOSPITAL_COMMUNITY): Payer: Self-pay

## 2020-11-04 DIAGNOSIS — G43809 Other migraine, not intractable, without status migrainosus: Secondary | ICD-10-CM | POA: Diagnosis not present

## 2020-11-04 MED ORDER — SUMATRIPTAN SUCCINATE 6 MG/0.5ML ~~LOC~~ SOLN
6.0000 mg | Freq: Once | SUBCUTANEOUS | Status: AC
  Administered 2020-11-04: 6 mg via SUBCUTANEOUS

## 2020-11-04 MED ORDER — SUMATRIPTAN SUCCINATE 6 MG/0.5ML ~~LOC~~ SOLN
SUBCUTANEOUS | Status: AC
  Filled 2020-11-04: qty 0.5

## 2020-11-04 MED ORDER — METOCLOPRAMIDE HCL 5 MG/ML IJ SOLN
5.0000 mg | Freq: Once | INTRAMUSCULAR | Status: AC
  Administered 2020-11-04: 5 mg via INTRAMUSCULAR

## 2020-11-04 MED ORDER — METOCLOPRAMIDE HCL 5 MG/ML IJ SOLN
INTRAMUSCULAR | Status: AC
  Filled 2020-11-04: qty 2

## 2020-11-04 MED ORDER — KETOROLAC TROMETHAMINE 30 MG/ML IJ SOLN
30.0000 mg | Freq: Once | INTRAMUSCULAR | Status: AC
  Administered 2020-11-04: 30 mg via INTRAMUSCULAR

## 2020-11-04 MED ORDER — KETOROLAC TROMETHAMINE 30 MG/ML IJ SOLN
INTRAMUSCULAR | Status: AC
  Filled 2020-11-04: qty 1

## 2020-11-04 NOTE — Discharge Instructions (Addendum)
Rest and drink plenty of fluids.    If you develop any worsening pain, go to the ED for further evaluation.    Return or go to the Emergency Department if symptoms worsen or do not improve in the next few days.

## 2020-11-04 NOTE — ED Triage Notes (Signed)
Pt reports right sided headache x 1 day. Ibuprofen and Tylenol gives no relief.

## 2020-11-04 NOTE — ED Provider Notes (Signed)
[facility]    CSN: [id number] Arrival date & time: 11/04/20  1528      History   Chief Complaint Chief Complaint  Patient presents with   Migraine    HPI Melinda Richardson is a 25 y.o. female.   Patient here for evaluation of headache that has been ongoing for the past day.  Reports having similar headaches in the past but states that they have never lasted this long.  Reports taking ibuprofen and Tylenol with minimal relief.  Reports some nausea but denies vomiting.  Denies any photophobia or visual changes.  Denies any trauma, injury, or other precipitating event.  Denies any specific alleviating or aggravating factors.  Denies any fevers, chest pain, shortness of breath, N/V/D, numbness, tingling, weakness, abdominal pain, or headaches.     The history is provided by the patient.  Migraine Associated symptoms include headaches.   Past Medical History:  Diagnosis Date   Anemia    Anxiety    Arthritis    Phreesia 06/20/2020   Diabetes mellitus without complication (HCC)    Phreesia 06/20/2020   Preterm labor    Preterm premature rupture of membranes (PPROM) with unknown onset of labor 12/11/2014    Patient Active Problem List   Diagnosis Date Noted   Class 3 severe obesity without serious comorbidity with body mass index (BMI) of 50.0 to 59.9 in adult [facility]) 05/20/2019   S/P cesarean section 08/31/2018   History of cesarean delivery 04/05/2017   ASCUS with positive high risk HPV cervical 01/10/2017    Past Surgical History:  Procedure Laterality Date   CESAREAN SECTION N/A 12/17/2014   Procedure: CESAREAN SECTION;  Surgeon: [name], MD;  Location: [facility];  Service: Obstetrics;  Laterality: N/A;   CESAREAN SECTION N/A 04/05/2017   Procedure: CESAREAN SECTION;  Surgeon: [name], DO;  Location: [facility] BIRTHING SUITES;  Service: Obstetrics;  Laterality: N/A;   CESAREAN SECTION N/A 08/30/2018   Procedure: CESAREAN SECTION;  Surgeon: [name],  MD;  Location: [facility] LD [facility]S;  Service: Obstetrics;  Laterality: N/A;   CESAREAN SECTION N/A    Phreesia 06/20/2020   TONSILLECTOMY      OB History     Gravida  3   Para  3   Term  1   Preterm  2   AB  0   Living  2      SAB  0   IAB  0   Ectopic  0   Multiple  0   Live Births  3            Home Medications    Prior to Admission medications   Medication Sig Start Date End Date Taking? Authorizing Provider  acetaminophen (TYLENOL) 325 MG tablet Take 650 mg by mouth every 6 (six) hours as needed.   Yes [provider]  ibuprofen (ADVIL) 800 MG tablet Take 1 tablet (800 mg total) by mouth every 6 (six) hours. 09/02/18  Yes Cresenzo-Dishmon, Scarlette Calico, CNM  oxyCODONE-acetaminophen (PERCOCET/ROXICET) 5-325 MG tablet Take 1 tablet by mouth every 6 (six) hours as needed for moderate pain or severe pain. Patient not taking: Reported on 05/20/2019 09/02/18   Jacklyn Shell, CNM    Family History Family History  Problem Relation Age of Onset   Kidney failure Father    Diabetes Mother     Social History Social History   Tobacco Use   Smoking status: Every Day    Packs/day: 0.25  Types: Cigarettes   Smokeless tobacco: Never  Vaping Use   Vaping Use: Never used  Substance Use Topics   Alcohol use: No   Drug use: Yes    Types: Marijuana     Allergies   Patient has no known allergies.   Review of Systems Review of Systems  Constitutional:  Negative for fever.  Eyes:  Negative for photophobia and visual disturbance.  Gastrointestinal:  Positive for nausea.  Neurological:  Positive for headaches. Negative for dizziness, facial asymmetry, weakness, light-headedness and numbness.  All other systems reviewed and are negative.   Physical Exam Triage Vital Signs ED Triage Vitals  Enc Vitals Group     BP 11/04/20 1545 125/83     Pulse Rate 11/04/20 1545 96     Resp 11/04/20 1545 20     Temp 11/04/20 1545 98.6 F (37 C)     Temp Source  11/04/20 1545 Oral     SpO2 11/04/20 1545 97 %     Weight --      Height --      Head Circumference --      Peak Flow --      Pain Score 11/04/20 1543 9     Pain Loc --      Pain Edu? --      Excl. in GC? --    No data found.  Updated Vital Signs BP 125/83 (BP Location: Right Arm)   Pulse 96   Temp 98.6 F (37 C) (Oral)   Resp 20   LMP  (Within Days)   SpO2 97%   Breastfeeding No   Visual Acuity Right Eye Distance:   Left Eye Distance:   Bilateral Distance:    Right Eye Near:   Left Eye Near:    Bilateral Near:     Physical Exam Vitals and nursing note reviewed.  Constitutional:      General: She is not in acute distress.    Appearance: Normal appearance. She is not ill-appearing, toxic-appearing or diaphoretic.  HENT:     Head: Normocephalic and atraumatic.     Nose: Nose normal.  Eyes:     Conjunctiva/sclera: Conjunctivae normal.     Pupils: Pupils are equal, round, and reactive to light.  Cardiovascular:     Rate and Rhythm: Normal rate.     Pulses: Normal pulses.     Heart sounds: Normal heart sounds.  Pulmonary:     Effort: Pulmonary effort is normal.     Breath sounds: Normal breath sounds.  Abdominal:     General: Abdomen is flat.  Musculoskeletal:        General: Normal range of motion.     Cervical back: Normal range of motion and neck supple.  Skin:    General: Skin is warm and dry.  Neurological:     General: No focal deficit present.     Mental Status: She is alert and oriented to person, place, and time.     GCS: GCS eye subscore is 4. GCS verbal subscore is 5. GCS motor subscore is 6.     Cranial Nerves: No dysarthria or facial asymmetry.     Sensory: Sensation is intact.     Motor: No weakness.     Coordination: Romberg sign negative.  Psychiatric:        Mood and Affect: Mood normal.     UC Treatments / Results  Labs (all labs ordered are listed, but only abnormal results are displayed) Labs Reviewed -  No data to  display  EKG   Radiology No results found.  Procedures Procedures (including critical care time)  Medications Ordered in UC Medications  ketorolac (TORADOL) 30 MG/ML injection 30 mg (has no administration in time range)  metoCLOPramide (REGLAN) injection 5 mg (has no administration in time range)  SUMAtriptan (IMITREX) injection 6 mg (has no administration in time range)    Initial Impression / Assessment and Plan / UC Course  I have reviewed the triage vital signs and the nursing notes.  Pertinent labs & imaging results that were available during my care of the patient were reviewed by me and considered in my medical decision making (see chart for details).    Assessment negative labs or concerns.  We will treat with migraine cocktail in office.  Recommend patient going home and resting.  Strict ED follow-up for any red flag symptoms. Final Clinical Impressions(s) / UC Diagnoses   Final diagnoses:  Other migraine without status migrainosus, not intractable     Discharge Instructions      Rest and drink plenty of fluids.    If you develop any worsening pain, go to the ED for further evaluation.    Return or go to the Emergency Department if symptoms worsen or do not improve in the next few days.      ED Prescriptions   None    PDMP not reviewed this encounter.   Ivette Loyal, NP 11/04/20 1610

## 2020-11-07 ENCOUNTER — Encounter (HOSPITAL_COMMUNITY): Payer: Self-pay | Admitting: Emergency Medicine

## 2020-11-07 ENCOUNTER — Other Ambulatory Visit: Payer: Self-pay

## 2020-11-07 ENCOUNTER — Emergency Department (HOSPITAL_COMMUNITY)
Admission: EM | Admit: 2020-11-07 | Discharge: 2020-11-07 | Disposition: A | Discharge status: Home / Self Care | Payer: Medicaid Other | Attending: Emergency Medicine | Admitting: Emergency Medicine

## 2020-11-07 DIAGNOSIS — F17210 Nicotine dependence, cigarettes, uncomplicated: Secondary | ICD-10-CM | POA: Diagnosis not present

## 2020-11-07 DIAGNOSIS — R3589 Other polyuria: Secondary | ICD-10-CM | POA: Diagnosis present

## 2020-11-07 DIAGNOSIS — E119 Type 2 diabetes mellitus without complications: Secondary | ICD-10-CM

## 2020-11-07 DIAGNOSIS — E1165 Type 2 diabetes mellitus with hyperglycemia: Secondary | ICD-10-CM | POA: Diagnosis not present

## 2020-11-07 LAB — COMPREHENSIVE METABOLIC PANEL
ALT: 22 U/L (ref 0–44)
AST: 16 U/L (ref 15–41)
Albumin: 4 g/dL (ref 3.5–5.0)
Alkaline Phosphatase: 83 U/L (ref 38–126)
Anion gap: 11 (ref 5–15)
BUN: 13 mg/dL (ref 6–20)
CO2: 22 mmol/L (ref 22–32)
Calcium: 9.1 mg/dL (ref 8.9–10.3)
Chloride: 99 mmol/L (ref 98–111)
Creatinine, Ser: 0.71 mg/dL (ref 0.44–1.00)
GFR, Estimated: >60 mL/min (ref >60)
Glucose, Bld: 441 mg/dL — ABNORMAL HIGH (ref 70–99)
Potassium: 4 mmol/L (ref 3.5–5.1)
Sodium: 132 mmol/L — ABNORMAL LOW (ref 135–145)
Total Bilirubin: 0.6 mg/dL (ref 0.3–1.2)
Total Protein: 7.6 g/dL (ref 6.5–8.1)

## 2020-11-07 LAB — URINALYSIS, ROUTINE W REFLEX MICROSCOPIC
Bilirubin Urine: NEGATIVE
Glucose, UA: >=500 mg/dL — AB
Ketones, ur: 80 mg/dL — AB
Leukocytes,Ua: NEGATIVE
Nitrite: NEGATIVE
Protein, ur: 30 mg/dL — AB
RBC / HPF: >50 RBC/hpf — ABNORMAL HIGH (ref 0–5)
Specific Gravity, Urine: 1.042 — ABNORMAL HIGH (ref 1.005–1.030)
pH: 5 (ref 5.0–8.0)

## 2020-11-07 LAB — CBC WITH DIFFERENTIAL/PLATELET
Abs Immature Granulocytes: 0.02 10*3/uL (ref 0.00–0.07)
Basophils Absolute: 0 10*3/uL (ref 0.0–0.1)
Basophils Relative: 0 %
Eosinophils Absolute: 0.1 10*3/uL (ref 0.0–0.5)
Eosinophils Relative: 2 %
HCT: 39.3 % (ref 36.0–46.0)
Hemoglobin: 12.5 g/dL (ref 12.0–15.0)
Immature Granulocytes: 0 %
Lymphocytes Relative: 42 %
Lymphs Abs: 2.8 10*3/uL (ref 0.7–4.0)
MCH: 27.6 pg (ref 26.0–34.0)
MCHC: 31.8 g/dL (ref 30.0–36.0)
MCV: 86.8 fL (ref 80.0–100.0)
Monocytes Absolute: 0.4 10*3/uL (ref 0.1–1.0)
Monocytes Relative: 6 %
Neutro Abs: 3.3 10*3/uL (ref 1.7–7.7)
Neutrophils Relative %: 50 %
Platelets: 222 10*3/uL (ref 150–400)
RBC: 4.53 MIL/uL (ref 3.87–5.11)
RDW: 14 % (ref 11.5–15.5)
WBC: 6.7 10*3/uL (ref 4.0–10.5)
nRBC: 0 % (ref 0.0–0.2)

## 2020-11-07 LAB — CBG MONITORING, ED
Glucose-Capillary: 410 mg/dL — ABNORMAL HIGH (ref 70–99)
Glucose-Capillary: 256 mg/dL — ABNORMAL HIGH (ref 70–99)

## 2020-11-07 LAB — PREGNANCY, URINE: Preg Test, Ur: NEGATIVE

## 2020-11-07 MED ORDER — METFORMIN HCL 500 MG PO TABS: 500.0000 mg | ORAL_TABLET | Freq: Two times a day (BID) | ORAL | 0 refills | Status: DC

## 2020-11-07 MED ORDER — SODIUM CHLORIDE 0.9 % IV BOLUS
1000.0000 mL | Freq: Once | INTRAVENOUS | Status: AC
  Administered 2020-11-07: 1000 mL via INTRAVENOUS

## 2020-11-07 NOTE — ED Provider Notes (Signed)
[facility]EMERGENCY [facility]PT Provider Note   CSN: [id number] Arrival date & time: 11/07/20  1548     History Chief Complaint  Patient presents with   Hyperglycemia    Melinda Richardson is a 25 y.o. female.  Patient is a 25 year old female who presents with polyuria and polydipsia.  She said that she was told she had diabetes about a year and a half ago by her OB/GYN.  She was told that she needed to follow-up with a primary care doctor but has not been able to get into see one.  She recently started checking her blood sugars over the last few days and have noted them to be in the 4-500 range.  She says that she does pee a lot and she is very thirsty but otherwise feels okay.  She has no nausea or vomiting.  No dizziness.  No chest pain or shortness of breath.  She has never been on medication for diabetes.  She said she did not have diabetes during her pregnancy.      Past Medical History:  Diagnosis Date   Anemia    Anxiety    Arthritis    Phreesia 06/20/2020   Diabetes mellitus without complication (HCC)    Phreesia 06/20/2020   Preterm labor    Preterm premature rupture of membranes (PPROM) with unknown onset of labor 12/11/2014    Patient Active Problem List   Diagnosis Date Noted   Class 3 severe obesity without serious comorbidity with body mass index (BMI) of 50.0 to 59.9 in adult [facility]) 05/20/2019   S/P cesarean section 08/31/2018   History of cesarean delivery 04/05/2017   ASCUS with positive high risk HPV cervical 01/10/2017    Past Surgical History:  Procedure Laterality Date   CESAREAN SECTION N/A 12/17/2014   Procedure: CESAREAN SECTION;  Surgeon: [name], MD;  Location: [facility];  Service: Obstetrics;  Laterality: N/A;   CESAREAN SECTION N/A 04/05/2017   Procedure: CESAREAN SECTION;  Surgeon: [name], DO;  Location: [facility] BIRTHING SUITES;  Service: Obstetrics;  Laterality: N/A;   CESAREAN SECTION N/A 08/30/2018   Procedure: CESAREAN  SECTION;  Surgeon: [name], MD;  Location: [facility];  Service: Obstetrics;  Laterality: N/A;   CESAREAN SECTION N/A    Phreesia 06/20/2020   TONSILLECTOMY       OB History     Gravida  3   Para  3   Term  1   Preterm  2   AB  0   Living  2      SAB  0   IAB  0   Ectopic  0   Multiple  0   Live Births  3           Family History  Problem Relation Age of Onset   Kidney failure Father    Diabetes Mother     Social History   Tobacco Use   Smoking status: Every Day    Packs/day: 0.25    Types: Cigarettes   Smokeless tobacco: Never  Vaping Use   Vaping Use: Never used  Substance Use Topics   Alcohol use: No   Drug use: Yes    Types: Marijuana    Home Medications Prior to Admission medications   Medication Sig Start Date End Date Taking? Authorizing Provider  acetaminophen (TYLENOL) 325 MG tablet Take 650 mg by mouth every 6 (six) hours as needed.    [provider]  ibuprofen (ADVIL) 800 MG tablet Take 1 tablet (800 mg total) by mouth every 6 (six) hours. 09/02/18   Cresenzo-Dishmon, Scarlette Calico, CNM  oxyCODONE-acetaminophen (PERCOCET/ROXICET) 5-325 MG tablet Take 1 tablet by mouth every 6 (six) hours as needed for moderate pain or severe pain. Patient not taking: Reported on 05/20/2019 09/02/18   Jacklyn Shell, CNM    Allergies    Patient has no known allergies.  Review of Systems   Review of Systems  Constitutional:  Negative for chills, diaphoresis, fatigue and fever.  HENT:  Negative for congestion, rhinorrhea and sneezing.   Eyes: Negative.   Respiratory:  Negative for cough, chest tightness and shortness of breath.   Cardiovascular:  Negative for chest pain and leg swelling.  Gastrointestinal:  Negative for abdominal pain, blood in stool, diarrhea, nausea and vomiting.  Endocrine: Positive for polydipsia and polyuria.  Genitourinary:  Negative for difficulty urinating, flank pain, frequency and hematuria.   Musculoskeletal:  Negative for arthralgias and back pain.  Skin:  Negative for rash.  Neurological:  Negative for dizziness, speech difficulty, weakness, numbness and headaches.   Physical Exam Updated Vital Signs BP (!) 137/95   Pulse 83   Temp 98.2 F (36.8 C) (Oral)   Resp 14   Ht 5\' 5"  (1.651 m)   Wt 136.1 kg   SpO2 100%   BMI 49.92 kg/m   Physical Exam Constitutional:      Appearance: She is well-developed. She is obese.  HENT:     Head: Normocephalic and atraumatic.  Eyes:     Pupils: Pupils are equal, round, and reactive to light.  Cardiovascular:     Rate and Rhythm: Normal rate and regular rhythm.     Heart sounds: Normal heart sounds.  Pulmonary:     Effort: Pulmonary effort is normal. No respiratory distress.     Breath sounds: Normal breath sounds. No wheezing or rales.  Chest:     Chest wall: No tenderness.  Abdominal:     General: Bowel sounds are normal.     Palpations: Abdomen is soft.     Tenderness: There is no abdominal tenderness. There is no guarding or rebound.  Musculoskeletal:        General: Normal range of motion.     Cervical back: Normal range of motion and neck supple.  Lymphadenopathy:     Cervical: No cervical adenopathy.  Skin:    General: Skin is warm and dry.     Findings: No rash.  Neurological:     Mental Status: She is alert and oriented to person, place, and time.    ED Results / Procedures / Treatments   Labs (all labs ordered are listed, but only abnormal results are displayed) Labs Reviewed  COMPREHENSIVE METABOLIC PANEL - Abnormal; Notable for the following components:      Result Value   Sodium 132 (*)    Glucose, Bld 441 (*)    All other components within normal limits  URINALYSIS, ROUTINE W REFLEX MICROSCOPIC - Abnormal; Notable for the following components:   APPearance HAZY (*)    Specific Gravity, Urine 1.042 (*)    Glucose, UA >=500 (*)    Hgb urine dipstick LARGE (*)    Ketones, ur 80 (*)    Protein, ur  30 (*)    RBC / HPF >50 (*)    Bacteria, UA RARE (*)    All other components within normal limits  CBG MONITORING, ED - Abnormal; Notable for the following components:  Glucose-Capillary 410 (*)    All other components within normal limits  CBC WITH DIFFERENTIAL/PLATELET  PREGNANCY, URINE  CBG MONITORING, ED    EKG None  Radiology No results found.  Procedures Procedures   Medications Ordered in ED Medications  sodium chloride 0.9 % bolus 1,000 mL (has no administration in time range)    ED Course  I have reviewed the triage vital signs and the nursing notes.  Pertinent labs & imaging results that were available during my care of the patient were reviewed by me and considered in my medical decision making (see chart for details).    MDM Rules/Calculators/A&P                           Patient is a 25 year old female who presents with hyperglycemia.  There is no suggestions of DKA.  She is otherwise well-appearing.  We will give her some IV fluids to improve her sugar and start her on metformin.  She has an appointment with the PCP on August 22.  I have sent a TOC consult to try to get the appointment moved up.  She does have a glucometer at home to check her sugars.  Dr. Madilyn Hook to take over pending recheck on her sugars after the IV fluids. Final Clinical Impression(s) / ED Diagnoses Final diagnoses:  Type 2 diabetes mellitus without complication, without long-term current use of insulin [facility])    Rx / DC Orders ED Discharge Orders     None        Rolan Bucco, MD 11/07/20 2256

## 2020-11-07 NOTE — ED Triage Notes (Signed)
Patient states her mother is diabetic and has been checking her blood sugar and it has been in the 500s the past few days, no confirmed dx of DM, has hx of gestational DM but no PCP. Endorses polyuria and polydipsia. No emesis or abdominal pain.

## 2020-11-07 NOTE — ED Provider Notes (Signed)
Emergency Medicine Provider Triage Evaluation Note  Melinda Richardson , a 25 y.o. female  was evaluated in triage.  Pt complains of polyuria/polydipsia.  She does not have any diagnosed history of diabetes mellitus but her mother is a diabetic and has been measuring her blood sugar and it has been in the 500s for the past few days so she sent her to the emergency department for evaluation.  Patient does note a history of gestational diabetes mellitus.  She has her first appointment with a new PCP on August 22.  Denies any blurry vision, nausea, vomiting, abdominal pain.  Physical Exam  BP (!) 152/82 (BP Location: Left Arm)   Pulse 82   Temp 98.2 F (36.8 C) (Oral)   Resp 16   Ht 5\' 5"  (1.651 m)   Wt 136.1 kg   SpO2 99%   BMI 49.92 kg/m  Gen:   Awake, no distress   Resp:  Normal effort  MSK:   Moves extremities without difficulty  Other:    Medical Decision Making  Medically screening exam initiated at 4:22 PM.  Appropriate orders placed.  Melinda Richardson was informed that the remainder of the evaluation will be completed by another provider, this initial triage assessment does not replace that evaluation, and the importance of remaining in the ED until their evaluation is complete.   , PA-C 11/07/20 1622    11/09/20, MD 11/07/20 1721

## 2020-11-26 ENCOUNTER — Ambulatory Visit: Payer: Medicaid Other | Admitting: Family Medicine

## 2020-11-29 ENCOUNTER — Telehealth: Payer: Medicaid Other | Admitting: Family

## 2020-11-29 ENCOUNTER — Encounter: Payer: Self-pay | Admitting: Family Medicine

## 2020-11-29 ENCOUNTER — Telehealth: Payer: Self-pay | Admitting: Family Medicine

## 2020-11-29 ENCOUNTER — Other Ambulatory Visit: Payer: Self-pay

## 2020-11-29 ENCOUNTER — Ambulatory Visit (INDEPENDENT_AMBULATORY_CARE_PROVIDER_SITE_OTHER): Payer: Medicaid Other | Admitting: Family Medicine

## 2020-11-29 VITALS — BP 121/80 | HR 97 | Temp 97.2°F | Resp 16 | Ht 65.0 in | Wt 272.0 lb

## 2020-11-29 DIAGNOSIS — Z7689 Persons encountering health services in other specified circumstances: Secondary | ICD-10-CM | POA: Diagnosis not present

## 2020-11-29 DIAGNOSIS — Z6841 Body Mass Index (BMI) 40.0 and over, adult: Secondary | ICD-10-CM | POA: Diagnosis not present

## 2020-11-29 DIAGNOSIS — E119 Type 2 diabetes mellitus without complications: Secondary | ICD-10-CM | POA: Diagnosis not present

## 2020-11-29 LAB — POCT GLYCOSYLATED HEMOGLOBIN (HGB A1C)

## 2020-11-29 LAB — GLUCOSE, POCT (MANUAL RESULT ENTRY): POC Glucose: 458 mg/dl — AB (ref 70–99)

## 2020-11-29 MED ORDER — LEVEMIR FLEXTOUCH 100 UNIT/ML ~~LOC~~ SOPN: PEN_INJECTOR | SUBCUTANEOUS | 0 refills | Status: DC

## 2020-11-29 MED ORDER — METFORMIN HCL 1000 MG PO TABS
1000.0000 mg | ORAL_TABLET | Freq: Two times a day (BID) | ORAL | 0 refills | Status: DC
Start: 2020-11-29 — End: 2021-03-07

## 2020-11-29 NOTE — Progress Notes (Signed)
New Patient Office Visit  Subjective:  Patient ID: Melinda Richardson, female    DOB: 09-Aug-1995  Age: 25 y.o. MRN: [id number]  CC:  Chief Complaint  Patient presents with   Diabetes   Back Pain    HPI [name] presents for to establish care. Patient reports some fatigue and polyuria and polydipsia. She is presently on no meds for her symptoms.   Past Medical History:  Diagnosis Date   Anemia    Anxiety    Arthritis    Phreesia 06/20/2020   Diabetes mellitus without complication (HCC)    Phreesia 06/20/2020   Preterm labor    Preterm premature rupture of membranes (PPROM) with unknown onset of labor 12/11/2014    Past Surgical History:  Procedure Laterality Date   CESAREAN SECTION N/A 12/17/2014   Procedure: CESAREAN SECTION;  Surgeon: [name], MD;  Location: [facility];  Service: Obstetrics;  Laterality: N/A;   CESAREAN SECTION N/A 04/05/2017   Procedure: CESAREAN SECTION;  Surgeon: [name], DO;  Location: [facility];  Service: Obstetrics;  Laterality: N/A;   CESAREAN SECTION N/A 08/30/2018   Procedure: CESAREAN SECTION;  Surgeon: [name], MD;  Location: [facility];  Service: Obstetrics;  Laterality: N/A;   CESAREAN SECTION N/A    Phreesia 06/20/2020   TONSILLECTOMY      Family History  Problem Relation Age of Onset   Kidney failure Father    Diabetes Mother     Social History   Socioeconomic History   Marital status: Single    Spouse name: Not on file   Number of children: Not on file   Years of education: Not on file   Highest education level: Not on file  Occupational History   Not on file  Tobacco Use   Smoking status: Every Day    Packs/day: 0.25    Types: Cigarettes   Smokeless tobacco: Never  Vaping Use   Vaping Use: Never used  Substance and Sexual Activity   Alcohol use: No   Drug use: Yes    Types: Marijuana   Sexual activity: Not Currently    Birth control/protection: None    Comment: desires Nexplanon   Other Topics Concern   Not on file  Social History Narrative   ** Merged History Encounter **       Social Determinants of Health   Financial Resource Strain: Not on file  Food Insecurity: Not on file  Transportation Needs: Not on file  Physical Activity: Not on file  Stress: Not on file  Social Connections: Not on file  Intimate Partner Violence: Not on file    ROS Review of Systems  Constitutional:  Positive for fatigue.  Eyes:  Positive for visual disturbance.  Endocrine: Positive for polydipsia and polyuria. Negative for polyphagia.  All other systems reviewed and are negative.  Objective:   Today's Vitals: BP 121/80 (BP Location: Right Arm, Patient Position: Sitting, Cuff Size: Large)   Pulse 97   Temp (!) 97.2 F (36.2 C)   Resp 16   Ht 5\' 5"  (1.651 m)   Wt 272 lb (123.4 kg)   LMP 10/18/2020 (Approximate) Comment: irregular  SpO2 98%   BMI 45.26 kg/m   Physical Exam Vitals and nursing note reviewed.  Constitutional:      General: She is not in acute distress.    Appearance: She is obese.  Cardiovascular:     Rate and Rhythm: Normal rate and regular rhythm.  Pulmonary:     Effort: Pulmonary effort is normal.     Breath sounds: Normal breath sounds.  Abdominal:     Palpations: Abdomen is soft.     Tenderness: There is no abdominal tenderness.  Neurological:     General: No focal deficit present.     Mental Status: She is alert and oriented to person, place, and time.    Assessment & Plan:   1. Type 2 diabetes mellitus without complication, without long-term current use of insulin (HCC) Discussed dietary and activity options in detail. Metformin 100 mg bid prescribed. Levimer 15 units daily was prescribed with titration to be done through [facility].   - HgB A1c - Glucose (CBG)  2. Class 3 severe obesity due to excess calories with serious comorbidity and body mass index (BMI) of 45.0 to 49.9 in adult [facility]) Discussed dietary and activity options with  goal of 3-5 lbs wt loss/mo to goal initial of 250 lbs.   3. Encounter to establish care      Follow-up: Return in about 6 weeks (around 01/10/2021) for follow up.   [name], MD

## 2020-11-29 NOTE — Progress Notes (Signed)
Establish care- DM-   Concerns with left lower back pain

## 2020-12-17 ENCOUNTER — Other Ambulatory Visit: Payer: Self-pay | Admitting: *Deleted

## 2020-12-17 DIAGNOSIS — E119 Type 2 diabetes mellitus without complications: Secondary | ICD-10-CM

## 2020-12-17 MED ORDER — ACCU-CHEK GUIDE ME W/DEVICE KIT: PACK | 0 refills | Status: DC

## 2020-12-17 NOTE — Progress Notes (Signed)
Order patient glucose meter and supplies

## 2021-01-11 ENCOUNTER — Ambulatory Visit: Payer: Medicaid Other | Admitting: Family Medicine

## 2021-01-17 ENCOUNTER — Ambulatory Visit
Admission: EM | Admit: 2021-01-17 | Discharge: 2021-01-17 | Disposition: A | Discharge status: Home / Self Care | Payer: Medicaid Other | Attending: Physician Assistant | Admitting: Physician Assistant

## 2021-01-17 ENCOUNTER — Other Ambulatory Visit: Payer: Self-pay

## 2021-01-17 ENCOUNTER — Encounter: Payer: Self-pay | Admitting: Emergency Medicine

## 2021-01-17 DIAGNOSIS — J029 Acute pharyngitis, unspecified: Secondary | ICD-10-CM | POA: Insufficient documentation

## 2021-01-17 DIAGNOSIS — R52 Pain, unspecified: Secondary | ICD-10-CM | POA: Insufficient documentation

## 2021-01-17 LAB — POCT RAPID STREP A (OFFICE): Rapid Strep A Screen: NEGATIVE

## 2021-01-17 NOTE — ED Triage Notes (Signed)
Patient c/o sore throat x 1 day, headache and body chills.  Patient has taken Tylenol and throat spray.  Patient is vaccinated for COVID.

## 2021-01-17 NOTE — Discharge Instructions (Addendum)
Check MyChart for results. We will call with any positive findings. Follow up with any further concerns.  

## 2021-01-17 NOTE — ED Provider Notes (Signed)
[facility]    CSN: [id number] Arrival date & time: 01/17/21  1554      History   Chief Complaint Chief Complaint  Patient presents with   Sore Throat    HPI Melinda Richardson is a 25 y.o. female.   Patient here today for evaluation of sore throat, headache, and body aches that started today.  She denies any significant nasal congestion or drainage.  She has not had any significant cough.  She denies nausea, vomiting or diarrhea.  She does not report any sick contacts.  She has tried over-the-counter treatment without significant relief.  The history is provided by the patient.  Sore Throat Associated symptoms include headaches. Pertinent negatives include no abdominal pain and no shortness of breath.   Past Medical History:  Diagnosis Date   Anemia    Anxiety    Arthritis    Phreesia 06/20/2020   Diabetes mellitus without complication ([place])    Phreesia 06/20/2020   Preterm labor    Preterm premature rupture of membranes (PPROM) with unknown onset of labor 12/11/2014    Patient Active Problem List   Diagnosis Date Noted   Class 3 severe obesity without serious comorbidity with body mass index (BMI) of 50.0 to 59.9 in adult [facility]) 05/20/2019   S/P cesarean section 08/31/2018   History of cesarean delivery 04/05/2017   ASCUS with positive high risk HPV cervical 01/10/2017    Past Surgical History:  Procedure Laterality Date   CESAREAN SECTION N/A 12/17/2014   Procedure: CESAREAN SECTION;  Surgeon: Jonnie Kind, MD;  Location: [place] [facility];  Service: Obstetrics;  Laterality: N/A;   CESAREAN SECTION N/A 04/05/2017   Procedure: CESAREAN SECTION;  Surgeon: Truett Mainland, DO;  Location: [place];  Service: Obstetrics;  Laterality: N/A;   CESAREAN SECTION N/A 08/30/2018   Procedure: CESAREAN SECTION;  Surgeon: Florian Buff, MD;  Location: [facility];  Service: Obstetrics;  Laterality: N/A;   CESAREAN SECTION N/A    Phreesia 06/20/2020   TONSILLECTOMY       OB History     Gravida  3   Para  3   Term  1   Preterm  2   AB  0   Living  2      SAB  0   IAB  0   Ectopic  0   Multiple  0   Live Births  3            Home Medications    Prior to Admission medications   Medication Sig Start Date End Date Taking? Authorizing Provider  Blood Glucose Monitoring Suppl (ACCU-CHEK GUIDE ME) w/Device KIT USE TO TAKE BLOOD SUGAR DAILY 12/17/20  Yes Dorna Mai, MD  insulin detemir (LEVEMIR FLEXTOUCH) 100 UNIT/ML FlexPen 15 units sq daily an d titrate upwards as recommended by consultant 11/29/20  Yes Dorna Mai, MD  metFORMIN (GLUCOPHAGE) 1000 MG tablet Take 1 tablet (1,000 mg total) by mouth 2 (two) times daily with a meal. 11/29/20  Yes Dorna Mai, MD    Family History Family History  Problem Relation Age of Onset   Kidney failure Father    Diabetes Mother     Social History Social History   Tobacco Use   Smoking status: Every Day    Packs/day: 0.25    Types: Cigarettes   Smokeless tobacco: Never  Vaping Use   Vaping Use: Never used  Substance Use Topics   Alcohol use: No  Drug use: Yes    Types: Marijuana     Allergies   Patient has no known allergies.   Review of Systems Review of Systems  Constitutional:  Negative for chills and fever.  HENT:  Positive for sore throat. Negative for congestion, ear pain and sinus pressure.   Eyes:  Negative for discharge and redness.  Respiratory:  Negative for cough, shortness of breath and wheezing.   Gastrointestinal:  Negative for abdominal pain, diarrhea, nausea and vomiting.  Musculoskeletal:  Positive for myalgias.  Neurological:  Positive for headaches.    Physical Exam Triage Vital Signs ED Triage Vitals  Enc Vitals Group     BP 01/17/21 1615 134/90     Pulse Rate 01/17/21 1615 94     Resp --      Temp 01/17/21 1615 98.2 F (36.8 C)     Temp Source 01/17/21 1615 Oral     SpO2 01/17/21 1615 97 %     Weight 01/17/21 1616 285 lb (129.3 kg)      Height 01/17/21 1616 5' 5"  (1.651 m)     Head Circumference --      Peak Flow --      Pain Score 01/17/21 1616 9     Pain Loc --      Pain Edu? --      Excl. in [place]? --    No data found.  Updated Vital Signs BP 134/90 (BP Location: Left Arm)   Pulse 94   Temp 98.2 F (36.8 C) (Oral)   Ht 5' 5"  (1.651 m)   Wt 285 lb (129.3 kg)   SpO2 97%   BMI 47.43 kg/m    Physical Exam Vitals and nursing note reviewed.  Constitutional:      General: She is not in acute distress.    Appearance: Normal appearance. She is not ill-appearing.  HENT:     Head: Normocephalic and atraumatic.     Right Ear: Tympanic membrane normal.     Left Ear: Tympanic membrane normal.     Nose: No congestion or rhinorrhea.     Mouth/Throat:     Mouth: Mucous membranes are moist.     Pharynx: Posterior oropharyngeal erythema present. No oropharyngeal exudate.  Eyes:     Conjunctiva/sclera: Conjunctivae normal.  Cardiovascular:     Rate and Rhythm: Normal rate and regular rhythm.     Heart sounds: Normal heart sounds. No murmur heard. Pulmonary:     Effort: Pulmonary effort is normal. No respiratory distress.     Breath sounds: Normal breath sounds. No wheezing, rhonchi or rales.  Skin:    General: Skin is warm and dry.  Neurological:     Mental Status: She is alert.  Psychiatric:        Mood and Affect: Mood normal.        Thought Content: Thought content normal.     UC Treatments / Results  Labs (all labs ordered are listed, but only abnormal results are displayed) Labs Reviewed  COVID-19, FLU A+B NAA  CULTURE, GROUP A STREP [facility])  POCT RAPID STREP A (OFFICE)    EKG   Radiology No results found.  Procedures Procedures (including critical care time)  Medications Ordered in UC Medications - No data to display  Initial Impression / Assessment and Plan / UC Course  I have reviewed the triage vital signs and the nursing notes.  Pertinent labs & imaging results that were  available during my care of the patient were  reviewed by me and considered in my medical decision making (see chart for details).  Strep test negative in office.  Will order throat culture as well as COVID and flu screening.  Will await results further recommendation.  Recommend over-the-counter treatment in the meantime.  Encouraged follow-up with any worsening.  Final Clinical Impressions(s) / UC Diagnoses   Final diagnoses:  Acute pharyngitis, unspecified etiology  Body aches     Discharge Instructions      Check MyChart for results. We will call with any positive findings. Follow up with any further concerns.      ED Prescriptions   None    PDMP not reviewed this encounter.   Francene Finders, PA-C 01/17/21 1715

## 2021-01-18 ENCOUNTER — Ambulatory Visit: Payer: Medicaid Other | Admitting: Family Medicine

## 2021-01-18 LAB — COVID-19, FLU A+B NAA
Influenza A, NAA: NOT DETECTED
Influenza B, NAA: NOT DETECTED
SARS-CoV-2, NAA: NOT DETECTED

## 2021-01-19 ENCOUNTER — Telehealth (HOSPITAL_COMMUNITY): Payer: Self-pay | Admitting: Emergency Medicine

## 2021-01-19 LAB — CULTURE, GROUP A STREP (THRC)

## 2021-01-19 MED ORDER — PENICILLIN V POTASSIUM 500 MG PO TABS: 500.0000 mg | ORAL_TABLET | Freq: Two times a day (BID) | ORAL | 0 refills | Status: AC

## 2021-02-07 ENCOUNTER — Ambulatory Visit: Payer: Medicaid Other | Admitting: Family Medicine

## 2021-02-11 ENCOUNTER — Other Ambulatory Visit: Payer: Self-pay

## 2021-02-11 ENCOUNTER — Ambulatory Visit (HOSPITAL_COMMUNITY)
Admission: EM | Admit: 2021-02-11 | Discharge: 2021-02-11 | Disposition: A | Discharge status: Home / Self Care | Payer: Medicaid Other | Attending: Emergency Medicine | Admitting: Emergency Medicine

## 2021-02-11 ENCOUNTER — Encounter (HOSPITAL_COMMUNITY): Payer: Self-pay

## 2021-02-11 DIAGNOSIS — J069 Acute upper respiratory infection, unspecified: Secondary | ICD-10-CM | POA: Diagnosis not present

## 2021-02-11 DIAGNOSIS — Z20822 Contact with and (suspected) exposure to covid-19: Secondary | ICD-10-CM | POA: Insufficient documentation

## 2021-02-11 MED ORDER — PROMETHAZINE-DM 6.25-15 MG/5ML PO SYRP: 5.0000 mL | ORAL_SOLUTION | Freq: Four times a day (QID) | ORAL | 0 refills | Status: DC | PRN

## 2021-02-11 MED ORDER — IBUPROFEN 600 MG PO TABS: 600.0000 mg | ORAL_TABLET | Freq: Four times a day (QID) | ORAL | 0 refills | Status: DC | PRN

## 2021-02-11 MED ORDER — OSELTAMIVIR PHOSPHATE 75 MG PO CAPS: 75.0000 mg | ORAL_CAPSULE | Freq: Two times a day (BID) | ORAL | 0 refills | Status: DC

## 2021-02-11 MED ORDER — FLUTICASONE PROPIONATE 50 MCG/ACT NA SUSP: 2.0000 | Freq: Every day | NASAL | 0 refills | Status: DC

## 2021-02-11 NOTE — ED Triage Notes (Signed)
Pt presents with URI sx x3 days

## 2021-02-11 NOTE — Discharge Instructions (Signed)
I will call in Tamiflu if your flu is positive, and Molnupiravir if your COVID is positive.  COVID will be back tomorrow.  Flonase, saline nasal irrigation with a Lloyd Huger Med rinse and distilled water as often as you want, Mucinex D and the nasal congestion, sinus pain and pressure, 600 mg of ibuprofen combined with 1000 mg of Tylenol together 3-4 times a day as needed for fevers, body aches, headaches, Promethazine DM for the cough.

## 2021-02-11 NOTE — ED Provider Notes (Signed)
HPI  SUBJECTIVE:  Melinda Richardson is a 25 y.o. female who presents with 3 days of headaches, sinus pain and pressure that is worse with bending over, loss of sense of smell and taste, fevers T-max 100, chills, nasal congestion, rhinorrhea, postnasal drip, mild sore throat and a dry cough.  States that she is unable to sleep at night secondary to the cough.  No wheezing, shortness of breath, nausea, vomiting, diarrhea, abdominal pain.  No known COVID or flu exposure.  She got the second dose of the COVID-vaccine.  She also got this years flu vaccine.  No antipyretic in the past 6 hours.  No allergy symptoms.  She tried Alka-Seltzer with improvement in her symptoms.  No aggravating factors.  She has a past medical history of diabetes and is status post tonsillectomy.  LMP: Amenorrheic due to OCPs.  Denies the possibility of being pregnant.  WGN:FAOZHY, Clyde Canterbury, MD   Past Medical History:  Diagnosis Date   Anemia    Anxiety    Arthritis    Phreesia 06/20/2020   Diabetes mellitus without complication ([place])    Phreesia 06/20/2020   Preterm labor    Preterm premature rupture of membranes (PPROM) with unknown onset of labor 12/11/2014    Past Surgical History:  Procedure Laterality Date   CESAREAN SECTION N/A 12/17/2014   Procedure: CESAREAN SECTION;  Surgeon: Jonnie Kind, MD;  Location: [place] [facility];  Service: Obstetrics;  Laterality: N/A;   CESAREAN SECTION N/A 04/05/2017   Procedure: CESAREAN SECTION;  Surgeon: Truett Mainland, DO;  Location: [place];  Service: Obstetrics;  Laterality: N/A;   CESAREAN SECTION N/A 08/30/2018   Procedure: CESAREAN SECTION;  Surgeon: Florian Buff, MD;  Location: [facility];  Service: Obstetrics;  Laterality: N/A;   CESAREAN SECTION N/A    Phreesia 06/20/2020   TONSILLECTOMY      Family History  Problem Relation Age of Onset   Kidney failure Father    Diabetes Mother     Social History   Tobacco Use   Smoking status: Every Day    Packs/day:  0.25    Types: Cigarettes   Smokeless tobacco: Never  Vaping Use   Vaping Use: Never used  Substance Use Topics   Alcohol use: No   Drug use: Yes    Types: Marijuana    No current facility-administered medications for this encounter.  Current Outpatient Medications:    fluticasone (FLONASE) 50 MCG/ACT nasal spray, Place 2 sprays into both nostrils daily., Disp: 16 g, Rfl: 0   ibuprofen (ADVIL) 600 MG tablet, Take 1 tablet (600 mg total) by mouth every 6 (six) hours as needed., Disp: 30 tablet, Rfl: 0   oseltamivir (TAMIFLU) 75 MG capsule, Take 1 capsule (75 mg total) by mouth 2 (two) times daily. X 5 days, Disp: 10 capsule, Rfl: 0   promethazine-dextromethorphan (PROMETHAZINE-DM) 6.25-15 MG/5ML syrup, Take 5 mLs by mouth 4 (four) times daily as needed for cough., Disp: 118 mL, Rfl: 0   Blood Glucose Monitoring Suppl (ACCU-CHEK GUIDE ME) w/Device KIT, USE TO TAKE BLOOD SUGAR DAILY, Disp: 1 kit, Rfl: 0   insulin detemir (LEVEMIR FLEXTOUCH) 100 UNIT/ML FlexPen, 15 units sq daily an d titrate upwards as recommended by consultant, Disp: 15 mL, Rfl: 0   metFORMIN (GLUCOPHAGE) 1000 MG tablet, Take 1 tablet (1,000 mg total) by mouth 2 (two) times daily with a meal., Disp: 180 tablet, Rfl: 0  No Known Allergies   ROS  As noted in  HPI.   Physical Exam  BP 113/78 (BP Location: Right Arm)   Pulse 78   Temp 98.3 F (36.8 C) (Oral)   Resp 14   SpO2 98%   Constitutional: Well developed, well nourished, no acute distress Eyes:  EOMI, conjunctiva normal bilaterally HENT: Normocephalic, atraumatic,mucus membranes moist.  Clear nasal congestion.  Erythematous, swollen turbinates.  Positive maxillary, frontal sinus tenderness.  Tonsils surgically absent.  Slightly erythematous oropharynx.  Uvula midline.  No postnasal drip. Neck: Positive shotty cervical lymphadenopathy Respiratory: Normal inspiratory effort, lungs clear bilaterally Cardiovascular: Normal rate, regular rhythm no murmurs rubs  or gallops GI: nondistended skin: No rash, skin intact Musculoskeletal: no deformities Neurologic: Alert & oriented x 3, no focal neuro deficits Psychiatric: Speech and behavior appropriate  ED Course   Medications - No data to display  Orders Placed This Encounter  Procedures   SARS CORONAVIRUS 2 (TAT 6-24 HRS) Nasopharyngeal Nasopharyngeal Swab    Standing Status:   Standing    Number of Occurrences:   1    Results for orders placed or performed during the hospital encounter of 02/11/21 (from the past 24 hour(s))  SARS CORONAVIRUS 2 (TAT 6-24 HRS) Nasopharyngeal Nasopharyngeal Swab     Status: None   Collection Time: 02/11/21  5:18 PM   Specimen: Nasopharyngeal Swab  Result Value Ref Range   SARS Coronavirus 2 NEGATIVE NEGATIVE   No results found.  ED Clinical Impression  1. Upper respiratory tract infection, unspecified type   2. Encounter for laboratory testing for COVID-19 virus      ED Assessment/Plan  COVID, flu sent.  She will be a candidate for Molnupiravir if COVID is positive due to BMI and diabetes.  Will prescribe Tamiflu if flu is positive.  In the meantime, Flonase, saline nasal irrigation, Mucinex D, Tylenol/ibuprofen, Promethazine DM.  COVID work note.  Follow-up with PMD as needed.  ER return precautions given.  Patient left before flu swab was obtained.  She is unable to come back for swab.  Today is the last day that she could start Tamiflu.  Will prescribe Tamiflu.  She is to to discontinue it if her COVID is positive.  COVID-negative.  Discussed labs, MDM, treatment plan, and plan for follow-up with patient. Discussed sn/sx that should prompt return to the ED. patient agrees with plan.   Meds ordered this encounter  Medications   fluticasone (FLONASE) 50 MCG/ACT nasal spray    Sig: Place 2 sprays into both nostrils daily.    Dispense:  16 g    Refill:  0   ibuprofen (ADVIL) 600 MG tablet    Sig: Take 1 tablet (600 mg total) by mouth every 6  (six) hours as needed.    Dispense:  30 tablet    Refill:  0   promethazine-dextromethorphan (PROMETHAZINE-DM) 6.25-15 MG/5ML syrup    Sig: Take 5 mLs by mouth 4 (four) times daily as needed for cough.    Dispense:  118 mL    Refill:  0   oseltamivir (TAMIFLU) 75 MG capsule    Sig: Take 1 capsule (75 mg total) by mouth 2 (two) times daily. X 5 days    Dispense:  10 capsule    Refill:  0      *This clinic note was created using [employer]. Therefore, there may be occasional mistakes despite careful proofreading.  ?    Melynda Ripple, MD 02/12/21 [phone]

## 2021-02-11 NOTE — ED Notes (Signed)
Called pt back to have her do POC flu swab but she took an uber so she couldn't return to UC. Notified provider.

## 2021-02-12 LAB — SARS CORONAVIRUS 2 (TAT 6-24 HRS): SARS Coronavirus 2: NEGATIVE

## 2021-02-16 ENCOUNTER — Ambulatory Visit: Payer: Medicaid Other | Admitting: Family Medicine

## 2021-02-25 ENCOUNTER — Encounter: Payer: Self-pay | Admitting: Emergency Medicine

## 2021-02-25 ENCOUNTER — Other Ambulatory Visit: Payer: Self-pay

## 2021-02-25 ENCOUNTER — Ambulatory Visit
Admission: EM | Admit: 2021-02-25 | Discharge: 2021-02-25 | Disposition: A | Discharge status: Home / Self Care | Payer: Medicaid Other | Attending: Physician Assistant | Admitting: Physician Assistant

## 2021-02-25 DIAGNOSIS — J029 Acute pharyngitis, unspecified: Secondary | ICD-10-CM | POA: Diagnosis not present

## 2021-02-25 LAB — POCT RAPID STREP A (OFFICE): Rapid Strep A Screen: NEGATIVE

## 2021-02-25 NOTE — ED Triage Notes (Signed)
Sore throat since she was seen last. States the medication we gave her helped mildly but that it didn't go away. Developed left eye irritation and body aches over the last two days that are new. Denies nausea, vomiting, diarrhea, fever.  Also complaining of a bump that formed on her hard palate  that hurts very bad at the start of symptoms.

## 2021-02-25 NOTE — Discharge Instructions (Signed)
Return if any problems.

## 2021-02-28 NOTE — ED Provider Notes (Signed)
[facility]    CSN: [id number] Arrival date & time: 02/25/21  1330      History   Chief Complaint Chief Complaint  Patient presents with   Sore Throat   Otalgia    HPI Melinda Richardson is a 25 y.o. female.   The history is provided by the patient. No language interpreter was used.  Sore Throat This is a new problem. The current episode started more than 1 week ago. The problem occurs constantly. The problem has not changed since onset.Pertinent negatives include no headaches. Nothing aggravates the symptoms. Nothing relieves the symptoms. She has tried nothing for the symptoms. The treatment provided no relief.  Otalgia Relieved by:  Nothing Worsened by:  Nothing Ineffective treatments:  None tried Associated symptoms: sore throat   Associated symptoms: no headaches    Past Medical History:  Diagnosis Date   Anemia    Anxiety    Arthritis    Phreesia 06/20/2020   Diabetes mellitus without complication ([place])    Phreesia 06/20/2020   Preterm labor    Preterm premature rupture of membranes (PPROM) with unknown onset of labor 12/11/2014    Patient Active Problem List   Diagnosis Date Noted   Class 3 severe obesity without serious comorbidity with body mass index (BMI) of 50.0 to 59.9 in adult [facility]) 05/20/2019   S/P cesarean section 08/31/2018   History of cesarean delivery 04/05/2017   ASCUS with positive high risk HPV cervical 01/10/2017    Past Surgical History:  Procedure Laterality Date   CESAREAN SECTION N/A 12/17/2014   Procedure: CESAREAN SECTION;  Surgeon: Jonnie Kind, MD;  Location: [facility];  Service: Obstetrics;  Laterality: N/A;   CESAREAN SECTION N/A 04/05/2017   Procedure: CESAREAN SECTION;  Surgeon: Truett Mainland, DO;  Location: [facility];  Service: Obstetrics;  Laterality: N/A;   CESAREAN SECTION N/A 08/30/2018   Procedure: CESAREAN SECTION;  Surgeon: Florian Buff, MD;  [facility];  Service: Obstetrics;   Laterality: N/A;   CESAREAN SECTION N/A    Phreesia 06/20/2020   TONSILLECTOMY      OB History     Gravida  3   Para  3   Term  1   Preterm  2   AB  0   Living  2      SAB  0   IAB  0   Ectopic  0   Multiple  0   Live Births  3            Home Medications    Prior to Admission medications   Medication Sig Start Date End Date Taking? Authorizing Provider  Blood Glucose Monitoring Suppl (ACCU-CHEK GUIDE ME) w/Device KIT USE TO TAKE BLOOD SUGAR DAILY 12/17/20   Dorna Mai, MD  fluticasone [facility]) 50 MCG/ACT nasal spray Place 2 sprays into both nostrils daily. 02/11/21   Melynda Ripple, MD  ibuprofen (ADVIL) 600 MG tablet Take 1 tablet (600 mg total) by mouth every 6 (six) hours as needed. 02/11/21   Melynda Ripple, MD  insulin detemir (LEVEMIR FLEXTOUCH) 100 UNIT/ML FlexPen 15 units sq daily an d titrate upwards as recommended by consultant 11/29/20   Dorna Mai, MD  metFORMIN (GLUCOPHAGE) 1000 MG tablet Take 1 tablet (1,000 mg total) by mouth 2 (two) times daily with a meal. 11/29/20   Dorna Mai, MD  oseltamivir (TAMIFLU) 75 MG capsule Take 1 capsule (75 mg total) by mouth 2 (two) times daily. X  5 days 02/11/21   Melynda Ripple, MD  promethazine-dextromethorphan (PROMETHAZINE-DM) 6.25-15 MG/5ML syrup Take 5 mLs by mouth 4 (four) times daily as needed for cough. 02/11/21   Melynda Ripple, MD    Family History Family History  Problem Relation Age of Onset   Kidney failure Father    Diabetes Mother     Social History Social History   Tobacco Use   Smoking status: Every Day    Packs/day: 0.25    Types: Cigarettes   Smokeless tobacco: Never  Vaping Use   Vaping Use: Never used  Substance Use Topics   Alcohol use: No   Drug use: Yes    Types: Marijuana     Allergies   Patient has no known allergies.   Review of Systems Review of Systems  HENT:  Positive for ear pain and sore throat.   Neurological:  Negative for headaches.   All other systems reviewed and are negative.   Physical Exam Triage Vital Signs ED Triage Vitals  Enc Vitals Group     BP 02/25/21 1549 134/83     Pulse Rate 02/25/21 1549 82     Resp 02/25/21 1549 16     Temp 02/25/21 1549 98.2 F (36.8 C)     Temp Source 02/25/21 1549 Oral     SpO2 02/25/21 1549 98 %     Weight --      Height --      Head Circumference --      Peak Flow --      Pain Score 02/25/21 1550 10     Pain Loc --      Pain Edu? --      Excl. in [place]? --    No data found.  Updated Vital Signs BP 134/83 (BP Location: Left Arm)   Pulse 82   Temp 98.2 F (36.8 C) (Oral)   Resp 16   SpO2 98%   Visual Acuity Right Eye Distance:   Left Eye Distance:   Bilateral Distance:    Right Eye Near:   Left Eye Near:    Bilateral Near:     Physical Exam Vitals and nursing note reviewed.  Constitutional:      Appearance: She is well-developed.  HENT:     Head: Normocephalic.     Mouth/Throat:     Mouth: Mucous membranes are moist.     Pharynx: Pharyngeal swelling and posterior oropharyngeal erythema present.  Pulmonary:     Effort: Pulmonary effort is normal.  Abdominal:     General: There is no distension.  Musculoskeletal:        General: Normal range of motion.     Cervical back: Normal range of motion.  Neurological:     Mental Status: She is alert and oriented to person, place, and time.     UC Treatments / Results  Labs (all labs ordered are listed, but only abnormal results are displayed) Labs Reviewed  POCT RAPID STREP A (OFFICE)    EKG   Radiology No results found.  Procedures Procedures (including critical care time)  Medications Ordered in UC Medications - No data to display  Initial Impression / Assessment and Plan / UC Course  I have reviewed the triage vital signs and the nursing notes.  Pertinent labs & imaging results that were available during my care of the patient were reviewed by me and considered in my medical decision  making (see chart for details).      Final Clinical Impressions(s) /  UC Diagnoses   Final diagnoses:  Acute pharyngitis, unspecified etiology     Discharge Instructions      Return if any problems.     ED Prescriptions   None    PDMP not reviewed this encounter.   Fransico Meadow, [place] 02/28/21 1004

## 2021-03-07 ENCOUNTER — Ambulatory Visit (INDEPENDENT_AMBULATORY_CARE_PROVIDER_SITE_OTHER): Payer: Medicaid Other | Admitting: Family Medicine

## 2021-03-07 ENCOUNTER — Other Ambulatory Visit: Payer: Self-pay

## 2021-03-07 ENCOUNTER — Encounter: Payer: Self-pay | Admitting: Family Medicine

## 2021-03-07 DIAGNOSIS — E119 Type 2 diabetes mellitus without complications: Secondary | ICD-10-CM | POA: Diagnosis not present

## 2021-03-07 DIAGNOSIS — Z794 Long term (current) use of insulin: Secondary | ICD-10-CM

## 2021-03-07 LAB — POCT GLYCOSYLATED HEMOGLOBIN (HGB A1C): Hemoglobin A1C: 7.9 % — AB (ref 4.0–5.6)

## 2021-03-07 MED ORDER — METFORMIN HCL 1000 MG PO TABS: 1000.0000 mg | ORAL_TABLET | Freq: Two times a day (BID) | ORAL | 0 refills | Status: DC

## 2021-03-07 MED ORDER — FREESTYLE LIBRE 2 SENSOR MISC: 5 refills | Status: AC

## 2021-03-07 MED ORDER — LEVEMIR FLEXTOUCH 100 UNIT/ML ~~LOC~~ SOPN: PEN_INJECTOR | SUBCUTANEOUS | 0 refills | Status: DC

## 2021-03-07 NOTE — Progress Notes (Signed)
Patient said she has been taking her medication and feeling.Marland KitchenMarland Kitchen

## 2021-03-08 ENCOUNTER — Encounter: Payer: Self-pay | Admitting: Family Medicine

## 2021-03-08 NOTE — Progress Notes (Signed)
Established Patient Office Visit  Subjective:  Patient ID: Melinda Richardson, female    DOB: 11-Apr-1995  Age: 25 y.o. MRN: [id number]  CC:  Chief Complaint  Patient presents with   Diabetes    HPI Melinda Richardson presents for complaint of follow up of diabetes. Patient reports that she would like something to help her monitor her blood sugars. Patient denies acute complaints.   Past Medical History:  Diagnosis Date   Anemia    Anxiety    Arthritis    Phreesia 06/20/2020   Diabetes mellitus without complication ([place])    Phreesia 06/20/2020   Preterm labor    Preterm premature rupture of membranes (PPROM) with unknown onset of labor 12/11/2014    Past Surgical History:  Procedure Laterality Date   CESAREAN SECTION N/A 12/17/2014   Procedure: CESAREAN SECTION;  Surgeon: Jonnie Kind, MD;  Location: [facility];  Service: Obstetrics;  Laterality: N/A;   CESAREAN SECTION N/A 04/05/2017   Procedure: CESAREAN SECTION;  Surgeon: Truett Mainland, DO;  Location: [facility];  Service: Obstetrics;  Laterality: N/A;   CESAREAN SECTION N/A 08/30/2018   Procedure: CESAREAN SECTION;  Surgeon: Florian Buff, MD;  [facility];  Service: Obstetrics;  Laterality: N/A;   CESAREAN SECTION N/A    Phreesia 06/20/2020   TONSILLECTOMY      Family History  Problem Relation Age of Onset   Kidney failure Father    Diabetes Mother     Social History   Socioeconomic History   Marital status: Single    Spouse name: Not on file   Number of children: Not on file   Years of education: Not on file   Highest education level: Not on file  Occupational History   Not on file  Tobacco Use   Smoking status: Every Day    Packs/day: 0.25    Types: Cigarettes   Smokeless tobacco: Never  Vaping Use   Vaping Use: Never used  Substance and Sexual Activity   Alcohol use: No   Drug use: Yes    Types: Marijuana   Sexual activity: Not Currently    Birth control/protection: None     Comment: desires Nexplanon  Other Topics Concern   Not on file  Social History Narrative   ** Merged History Encounter **       Social Determinants of Health   Financial Resource Strain: Not on file  Food Insecurity: Not on file  Transportation Needs: Not on file  Physical Activity: Not on file  Stress: Not on file  Social Connections: Not on file  Intimate Partner Violence: Not on file    ROS Review of Systems  All other systems reviewed and are negative.  Objective:   Today's Vitals: BP 124/81   Pulse 93   Temp 98.2 F (36.8 C) (Oral)   Resp 16   Wt 279 lb (126.6 kg)   SpO2 97%   BMI 46.43 kg/m   Physical Exam Vitals and nursing note reviewed.  Constitutional:      General: She is not in acute distress. Cardiovascular:     Rate and Rhythm: Normal rate and regular rhythm.  Pulmonary:     Effort: Pulmonary effort is normal.     Breath sounds: Normal breath sounds.  Abdominal:     Palpations: Abdomen is soft.     Tenderness: There is no abdominal tenderness.  Neurological:     General: No focal deficit present.  Mental Status: She is alert and oriented to person, place, and time.    Assessment & Plan:   1. Type 2 diabetes mellitus without complication, with long-term current use of insulin (HCC) Improved A1c but not quite at goal. Continue present management. [place] libre sensor prescribed. Will monitor  - POCT glycosylated hemoglobin (Hb A1C)  Outpatient Encounter Medications as of 03/07/2021  Medication Sig   Continuous Blood Gluc Sensor (FREESTYLE LIBRE 2 SENSOR) MISC Utilize one sensor q14 days to monitor blood glucose   Blood Glucose Monitoring Suppl (ACCU-CHEK GUIDE ME) w/Device KIT USE TO TAKE BLOOD SUGAR DAILY   fluticasone (FLONASE) 50 MCG/ACT nasal spray Place 2 sprays into both nostrils daily.   ibuprofen (ADVIL) 600 MG tablet Take 1 tablet (600 mg total) by mouth every 6 (six) hours as needed.   insulin detemir (LEVEMIR FLEXTOUCH) 100  UNIT/ML FlexPen 15 units sq daily an d titrate upwards as recommended by consultant   metFORMIN (GLUCOPHAGE) 1000 MG tablet Take 1 tablet (1,000 mg total) by mouth 2 (two) times daily with a meal.   oseltamivir (TAMIFLU) 75 MG capsule Take 1 capsule (75 mg total) by mouth 2 (two) times daily. X 5 days   promethazine-dextromethorphan (PROMETHAZINE-DM) 6.25-15 MG/5ML syrup Take 5 mLs by mouth 4 (four) times daily as needed for cough.   [DISCONTINUED] insulin detemir (LEVEMIR FLEXTOUCH) 100 UNIT/ML FlexPen 15 units sq daily an d titrate upwards as recommended by consultant   [DISCONTINUED] metFORMIN (GLUCOPHAGE) 1000 MG tablet Take 1 tablet (1,000 mg total) by mouth 2 (two) times daily with a meal.   No facility-administered encounter medications on file as of 03/07/2021.    Follow-up: Return in about 3 months (around 06/07/2021).   Becky Sax, MD

## 2021-04-01 ENCOUNTER — Other Ambulatory Visit: Payer: Self-pay | Admitting: Family Medicine

## 2021-04-06 ENCOUNTER — Telehealth: Payer: Self-pay | Admitting: Family Medicine

## 2021-04-06 ENCOUNTER — Other Ambulatory Visit: Payer: Self-pay | Admitting: *Deleted

## 2021-04-06 MED ORDER — LEVEMIR FLEXTOUCH 100 UNIT/ML ~~LOC~~ SOPN: PEN_INJECTOR | SUBCUTANEOUS | 0 refills | Status: DC

## 2021-04-06 MED ORDER — METFORMIN HCL 1000 MG PO TABS: 1000.0000 mg | ORAL_TABLET | Freq: Two times a day (BID) | ORAL | 0 refills | Status: DC

## 2021-04-06 NOTE — Telephone Encounter (Signed)
Refill has been sent to pharmacy.  

## 2021-04-06 NOTE — Telephone Encounter (Signed)
°  Has 0 of this medication:  insulin detemir (LEVEMIR FLEXTOUCH) 100 UNIT/ML FlexPen [[id number]]   Has few left of this: metFORMIN (GLUCOPHAGE) 1000 MG tablet [[id number]]   Pharmacy  [facility] [address], [place] - [address]  [address], [place] [address]  Phone:  [phone]  Fax:  [phone]

## 2021-04-11 ENCOUNTER — Other Ambulatory Visit: Payer: Self-pay | Admitting: Family Medicine

## 2021-06-06 ENCOUNTER — Other Ambulatory Visit: Payer: Self-pay

## 2021-06-06 ENCOUNTER — Ambulatory Visit (INDEPENDENT_AMBULATORY_CARE_PROVIDER_SITE_OTHER): Payer: Medicaid Other | Admitting: Family Medicine

## 2021-06-06 ENCOUNTER — Encounter: Payer: Self-pay | Admitting: Family Medicine

## 2021-06-06 VITALS — BP 117/73 | HR 94 | Temp 97.1°F | Resp 16 | Wt 295.0 lb

## 2021-06-06 DIAGNOSIS — B3731 Acute candidiasis of vulva and vagina: Secondary | ICD-10-CM | POA: Diagnosis not present

## 2021-06-06 DIAGNOSIS — Z794 Long term (current) use of insulin: Secondary | ICD-10-CM

## 2021-06-06 DIAGNOSIS — Z6841 Body Mass Index (BMI) 40.0 and over, adult: Secondary | ICD-10-CM

## 2021-06-06 DIAGNOSIS — E119 Type 2 diabetes mellitus without complications: Secondary | ICD-10-CM

## 2021-06-06 MED ORDER — FLUCONAZOLE 150 MG PO TABS: ORAL_TABLET | ORAL | 0 refills | Status: DC

## 2021-06-06 NOTE — Progress Notes (Signed)
Established Patient Office Visit  Subjective:  Patient ID: Melinda Richardson, female    DOB: 1995-11-05  Age: 26 y.o. MRN: [id number]  CC:  Chief Complaint  Patient presents with   Follow-up   Diabetes   Urinary Tract Infection    HPI Melinda Richardson presents for follow up of diabetes. Also reports that she has been having a persistent vaginal yeast infection.   Past Medical History:  Diagnosis Date   Anemia    Anxiety    Arthritis    Phreesia 06/20/2020   Diabetes mellitus without complication ([place])    Phreesia 06/20/2020   Preterm labor    Preterm premature rupture of membranes (PPROM) with unknown onset of labor 12/11/2014    Past Surgical History:  Procedure Laterality Date   CESAREAN SECTION N/A 12/17/2014   Procedure: CESAREAN SECTION;  Surgeon: Jonnie Kind, MD;  Location: [facility];  Service: Obstetrics;  Laterality: N/A;   CESAREAN SECTION N/A 04/05/2017   Procedure: CESAREAN SECTION;  Surgeon: Truett Mainland, DO;  Location: [facility];  Service: Obstetrics;  Laterality: N/A;   CESAREAN SECTION N/A 08/30/2018   Procedure: CESAREAN SECTION;  Surgeon: Florian Buff, MD;  [facility];  Service: Obstetrics;  Laterality: N/A;   CESAREAN SECTION N/A    Phreesia 06/20/2020   TONSILLECTOMY      Family History  Problem Relation Age of Onset   Kidney failure Father    Diabetes Mother     Social History   Socioeconomic History   Marital status: Single    Spouse name: Not on file   Number of children: Not on file   Years of education: Not on file   Highest education level: Not on file  Occupational History   Not on file  Tobacco Use   Smoking status: Every Day    Packs/day: 0.25    Types: Cigarettes   Smokeless tobacco: Never  Vaping Use   Vaping Use: Never used  Substance and Sexual Activity   Alcohol use: No   Drug use: Yes    Types: Marijuana   Sexual activity: Not Currently    Birth control/protection: None    Comment: desires  Nexplanon  Other Topics Concern   Not on file  Social History Narrative   ** Merged History Encounter **       Social Determinants of Health   Financial Resource Strain: Not on file  Food Insecurity: Not on file  Transportation Needs: Not on file  Physical Activity: Not on file  Stress: Not on file  Social Connections: Not on file  Intimate Partner Violence: Not on file    ROS Review of Systems  Genitourinary:  Positive for vaginal discharge. Negative for dysuria and menstrual problem.  All other systems reviewed and are negative.  Objective:   Today's Vitals: BP 117/73    Pulse 94    Temp (!) 97.1 F (36.2 C) (Oral)    Resp 16    Wt 295 lb (133.8 kg)    SpO2 96%    BMI 49.09 kg/m   Physical Exam Vitals and nursing note reviewed.  Constitutional:      General: She is not in acute distress.    Appearance: She is obese.  Cardiovascular:     Rate and Rhythm: Normal rate and regular rhythm.  Pulmonary:     Effort: Pulmonary effort is normal.     Breath sounds: Normal breath sounds.  Abdominal:  Palpations: Abdomen is soft.     Tenderness: There is no abdominal tenderness.  Genitourinary:    Comments: Deferred. Patient on menses Neurological:     General: No focal deficit present.     Mental Status: She is alert and oriented to person, place, and time.    Assessment & Plan:    1. Yeast vaginitis Diflucan prescribed. ? DM controlled.   2. Type 2 diabetes mellitus without complication, with long-term current use of insulin ([place]) Improved per patient. Patient to schedule for check of A1c.  3. Class 3 severe obesity due to excess calories with serious comorbidity and body mass index (BMI) of 45.0 to 49.9 in adult [facility]) Patient gained weight. Discussed diet and activity options.    Outpatient Encounter Medications as of 06/06/2021  Medication Sig   Blood Glucose Monitoring Suppl (ACCU-CHEK GUIDE ME) w/Device KIT USE TO TAKE BLOOD SUGAR DAILY   Continuous Blood  Gluc Sensor (FREESTYLE LIBRE 2 SENSOR) MISC Utilize one sensor q14 days to monitor blood glucose   fluconazole (DIFLUCAN) 150 MG tablet 1 po now and 1 po day 4 and day 7   fluticasone (FLONASE) 50 MCG/ACT nasal spray Place 2 sprays into both nostrils daily.   ibuprofen (ADVIL) 600 MG tablet Take 1 tablet (600 mg total) by mouth every 6 (six) hours as needed.   insulin detemir (LEVEMIR FLEXTOUCH) 100 UNIT/ML FlexPen INJECT 15 UNITS SUBCUTANEOUSLY ONCE DAILY AND TITRATE UPWARDS AS RECOMMENDED BY CONSULTANT TO A MAX OF 30 UNITS DAILY   metFORMIN (GLUCOPHAGE) 1000 MG tablet Take 1 tablet (1,000 mg total) by mouth 2 (two) times daily with a meal.   oseltamivir (TAMIFLU) 75 MG capsule Take 1 capsule (75 mg total) by mouth 2 (two) times daily. X 5 days   promethazine-dextromethorphan (PROMETHAZINE-DM) 6.25-15 MG/5ML syrup Take 5 mLs by mouth 4 (four) times daily as needed for cough.   No facility-administered encounter medications on file as of 06/06/2021.    Follow-up: No follow-ups on file.   Becky Sax, MD

## 2021-06-06 NOTE — Progress Notes (Signed)
Patient is here for f/up DMII. Patient also c/o burning and yeast x 2 months. Patient denies back pain

## 2021-06-13 ENCOUNTER — Ambulatory Visit: Payer: Medicaid Other

## 2021-08-29 ENCOUNTER — Ambulatory Visit: Payer: Medicaid Other | Admitting: Family Medicine

## 2021-11-03 ENCOUNTER — Encounter: Payer: Self-pay | Admitting: Family Medicine

## 2021-11-03 ENCOUNTER — Other Ambulatory Visit: Payer: Self-pay | Admitting: Family Medicine

## 2021-11-03 ENCOUNTER — Ambulatory Visit (INDEPENDENT_AMBULATORY_CARE_PROVIDER_SITE_OTHER): Payer: Medicaid Other | Admitting: Family Medicine

## 2021-11-03 VITALS — BP 125/80 | HR 76 | Temp 98.1°F | Resp 16 | Ht 65.0 in | Wt 290.6 lb

## 2021-11-03 DIAGNOSIS — Z794 Long term (current) use of insulin: Secondary | ICD-10-CM | POA: Diagnosis not present

## 2021-11-03 DIAGNOSIS — E1165 Type 2 diabetes mellitus with hyperglycemia: Secondary | ICD-10-CM

## 2021-11-03 DIAGNOSIS — B3731 Acute candidiasis of vulva and vagina: Secondary | ICD-10-CM | POA: Diagnosis not present

## 2021-11-03 DIAGNOSIS — E119 Type 2 diabetes mellitus without complications: Secondary | ICD-10-CM

## 2021-11-03 LAB — POCT GLYCOSYLATED HEMOGLOBIN (HGB A1C): Hemoglobin A1C: 13.4 % — AB (ref 4.0–5.6)

## 2021-11-03 MED ORDER — FLUCONAZOLE 150 MG PO TABS: 150.0000 mg | ORAL_TABLET | Freq: Once | ORAL | 0 refills | Status: DC

## 2021-11-03 MED ORDER — LEVEMIR FLEXTOUCH 100 UNIT/ML ~~LOC~~ SOPN: PEN_INJECTOR | SUBCUTANEOUS | 1 refills | Status: DC

## 2021-11-05 ENCOUNTER — Encounter: Payer: Self-pay | Admitting: Family Medicine

## 2021-11-05 ENCOUNTER — Other Ambulatory Visit: Payer: Self-pay | Admitting: Family Medicine

## 2021-11-05 MED ORDER — FLUCONAZOLE 150 MG PO TABS: ORAL_TABLET | ORAL | 0 refills | Status: DC

## 2021-11-05 NOTE — Progress Notes (Signed)
Establislhed Patient Office Visit  Subjective    Patient ID: Melinda Richardson, female    DOB: Jan 20, 1996  Age: 26 y.o. MRN: [id number]  CC: No chief complaint on file.   HPI [employer] presents for follow up of diabetes. Patient complains of vaginal yeast infection.    Outpatient Encounter Medications as of 11/03/2021  Medication Sig   Blood Glucose Monitoring Suppl (ACCU-CHEK GUIDE ME) w/Device KIT USE TO TAKE BLOOD SUGAR DAILY   Continuous Blood Gluc Sensor (FREESTYLE LIBRE 2 SENSOR) MISC Utilize one sensor q14 days to monitor blood glucose   [EXPIRED] fluconazole (DIFLUCAN) 150 MG tablet Take 1 tablet (150 mg total) by mouth once for 1 dose. 1 po now and 1 po day 4 and day 7   fluticasone (FLONASE) 50 MCG/ACT nasal spray Place 2 sprays into both nostrils daily.   ibuprofen (ADVIL) 600 MG tablet Take 1 tablet (600 mg total) by mouth every 6 (six) hours as needed.   metFORMIN (GLUCOPHAGE) 1000 MG tablet Take 1 tablet (1,000 mg total) by mouth 2 (two) times daily with a meal.   oseltamivir (TAMIFLU) 75 MG capsule Take 1 capsule (75 mg total) by mouth 2 (two) times daily. X 5 days   promethazine-dextromethorphan (PROMETHAZINE-DM) 6.25-15 MG/5ML syrup Take 5 mLs by mouth 4 (four) times daily as needed for cough.   [DISCONTINUED] fluconazole (DIFLUCAN) 150 MG tablet 1 po now and 1 po day 4 and day 7   [DISCONTINUED] insulin detemir (LEVEMIR FLEXTOUCH) 100 UNIT/ML FlexPen INJECT 15 UNITS SUBCUTANEOUSLY ONCE DAILY AND TITRATE UPWARDS AS RECOMMENDED BY CONSULTANT TO A MAX OF 30 UNITS DAILY   insulin detemir (LEVEMIR FLEXTOUCH) 100 UNIT/ML FlexPen 20 units SQ BID   No facility-administered encounter medications on file as of 11/03/2021.    Past Medical History:  Diagnosis Date   Anemia    Anxiety    Arthritis    Phreesia 06/20/2020   Diabetes mellitus without complication ([place])    Phreesia 06/20/2020   Preterm labor    Preterm premature rupture of membranes (PPROM) with unknown  onset of labor 12/11/2014    Past Surgical History:  Procedure Laterality Date   CESAREAN SECTION N/A 12/17/2014   Procedure: CESAREAN SECTION;  Surgeon: Jonnie Kind, MD;  Location: [facility];  Service: Obstetrics;  Laterality: N/A;   CESAREAN SECTION N/A 04/05/2017   Procedure: CESAREAN SECTION;  Surgeon: Truett Mainland, DO;  Location: [facility];  Service: Obstetrics;  Laterality: N/A;   CESAREAN SECTION N/A 08/30/2018   Procedure: CESAREAN SECTION;  Surgeon: Florian Buff, MD;  [facility];  Service: Obstetrics;  Laterality: N/A;   CESAREAN SECTION N/A    Phreesia 06/20/2020   TONSILLECTOMY      Family History  Problem Relation Age of Onset   Kidney failure Father    Diabetes Mother     Social History   Socioeconomic History   Marital status: Single    Spouse name: Not on file   Number of children: Not on file   Years of education: Not on file   Highest education level: Not on file  Occupational History   Not on file  Tobacco Use   Smoking status: Every Day    Packs/day: 0.25    Types: Cigarettes   Smokeless tobacco: Never  Vaping Use   Vaping Use: Never used  Substance and Sexual Activity   Alcohol use: No   Drug use: Yes    Types: Marijuana  Sexual activity: Not Currently    Birth control/protection: None    Comment: desires Nexplanon  Other Topics Concern   Not on file  Social History Narrative   ** Merged History Encounter **       Social Determinants of Health   Financial Resource Strain: Low Risk  (08/28/2018)   Overall Financial Resource Strain (CARDIA)    Difficulty of Paying Living Expenses: Not hard at all  Food Insecurity: No Food Insecurity (08/28/2018)   Hunger Vital Sign    Worried About Running Out of Food in the Last Year: Never true    [place] in the Last Year: Never true  Transportation Needs: Unknown (08/28/2018)   PRAPARE - Hydrologist (Medical): No    Lack of Transportation  (Non-Medical): Not on file  Physical Activity: Not on file  Stress: No Stress Concern Present (08/28/2018)   [place]    Feeling of Stress : Not at all  Social Connections: Not on file  Intimate Partner Violence: Not At Risk (08/28/2018)   Humiliation, Afraid, Rape, and Kick questionnaire    Fear of Current or Ex-Partner: No    Emotionally Abused: No    Physically Abused: No    Sexually Abused: No    Review of Systems  All other systems reviewed and are negative.       Objective    BP 125/80   Pulse 76   Temp 98.1 F (36.7 C) (Oral)   Resp 16   Ht _0  (1.651 m)   Wt 290 lb 9.6 oz (131.8 kg)   SpO2 97%   BMI 48.36 kg/m   Physical Exam Vitals and nursing note reviewed.  Constitutional:      General: She is not in acute distress. Cardiovascular:     Rate and Rhythm: Normal rate and regular rhythm.  Pulmonary:     Effort: Pulmonary effort is normal.     Breath sounds: Normal breath sounds.  Abdominal:     Palpations: Abdomen is soft.     Tenderness: There is no abdominal tenderness.  Neurological:     General: No focal deficit present.     Mental Status: She is alert and oriented to person, place, and time.  Psychiatric:        Mood and Affect: Mood normal.        Behavior: Behavior normal.         Assessment & Plan:   1. Uncontrolled diabetes mellitus with hyperglycemia, with long-term current use of insulin (HCC) Greatly increased A1c and far above goal.  Referral to [facility] for further eval/mgt - POCT glycosylated hemoglobin (Hb A1C) - Microalbumin / creatinine urine ratio  2. Vaginitis due to Candida Diflucan prescribed    Return in about 4 months (around 03/06/2022) for chronic med issues.   Becky Sax, MD

## 2021-11-08 LAB — MICROALBUMIN / CREATININE URINE RATIO
Creatinine, Urine: 30.2 mg/dL
Microalb/Creat Ratio: 96 mg/g creat — ABNORMAL HIGH (ref 0–29)
Microalbumin, Urine: 29.1 ug/mL

## 2021-12-06 NOTE — Progress Notes (Deleted)
S:    PCP: Bonnita Nasuti is a 26 y.o. female who presents for diabetes evaluation, education, and management. PMH is significant for T2DM, anxiety, arthritis and obesity.   Patient was referred and last seen by Primary Care Provider, Dr. Georganna Skeans, on 7/27. At last visit, A1c was elevated at that visit to 13.4, previously 7.9 (11/22). Levemir was increased from 15 units BID to 20 units BID  Today, patient arrives in *** good spirits and presents without *** any assistance. ***  Patient reports Diabetes was diagnosed in ***.   Family/Social History:  -FH: CKD and DM -Tobacco: 0.25 ppd -Alcohol: denies  Current diabetes medications include: Levemir 20 units BID, metformin 1000mg  BID Current hyperlipidemia medications include: ***  Patient reports adherence to taking all medications as prescribed.  *** Patient denies adherence with medications, reports missing *** medications *** times per week, on average.  Do you feel that your medications are working for you? {YES NO:22349} Have you been experiencing any side effects to the medications prescribed? {YES NO:22349} Do you have any problems obtaining medications due to transportation or finances? {YES Insurance coverage: ***  Patient {Actions; denies-reports:120008} hypoglycemic events.  Reported home fasting blood sugars: ***  Reported 2 hour post-meal/random blood sugars: ***.  Patient {Actions; denies-reports:120008} nocturia (nighttime urination).  Patient {Actions; denies-reports:120008} neuropathy (nerve pain). Patient {Actions; denies-reports:120008} visual changes. Patient {Actions; denies-reports:120008} self foot exams.   Patient reported dietary habits: Eats *** meals/day Breakfast: *** Lunch: *** Dinner: *** Snacks: *** Drinks: ***  Within the past 12 months, did you worry whether your food would run out before you got money to buy more? {YES NO:22349} Within the past 12  months, did the food you bought run out, and you didn't have money to get more? {YES NO:22349} PHQ-9 Score: ***  Patient-reported exercise habits: ***   O:   Lab Results  Component Value Date   HGBA1C 13.4 (A) 11/03/2021   There were no vitals filed for this visit.  Lipid Panel     Component Value Date/Time   CHOL 172 05/20/2019 1350   TRIG 91 05/20/2019 1350   HDL 34 (L) 05/20/2019 1350   CHOLHDL 5.1 (H) 05/20/2019 1350   LDLCALC 121 (H) 05/20/2019 1350    Clinical Atherosclerotic Cardiovascular Disease (ASCVD):  A/P: Diabetes longstanding *** currently ***. Patient is *** able to verbalize appropriate hypoglycemia management plan. Medication adherence appears ***. Control is suboptimal due to ***. -{Meds adjust:18428} basal insulin *** (insulin ***). Patient will continue to titrate 1 unit every *** days if fasting blood sugar > 100mg /dl until fasting blood sugars reach goal or next visit.  -{Meds adjust:18428} rapid insulin *** (insulin ***) to ***.  -{Meds adjust:18428} GLP-1 *** (generic ***) to ***.  -{Meds adjust:18428} SGLT2-I *** (generic ***) to ***. Counseled on sick day rules. -{Meds adjust:18428} metformin *** to ***.  -Patient educated on purpose, proper use, and potential adverse effects of ***.  -Extensively discussed pathophysiology of diabetes, recommended lifestyle interventions, dietary effects on blood sugar control.  -Counseled on s/sx of and management of hypoglycemia.  -Next A1c anticipated ***.   ASCVD risk - primary prevention in patient with diabetes. Last LDL is *** not at goal of 07/18/2019 *** mg/dL. ASCVD risk factors include *** and 10-year ASCVD risk score of ***. {Desc; low/moderate/high:110033} intensity statin indicated.  -{Meds adjust:18428} ***statin *** mg.   Written patient instructions provided. Patient verbalized understanding of treatment plan.  Total time in  face to face counseling *** minutes.    Follow-up:  Pharmacist ***. PCP clinic  visit in November

## 2021-12-08 ENCOUNTER — Ambulatory Visit: Payer: Medicaid Other | Admitting: Pharmacist

## 2022-03-06 ENCOUNTER — Ambulatory Visit: Payer: Medicaid Other | Admitting: Family Medicine

## 2022-03-23 ENCOUNTER — Ambulatory Visit: Payer: Medicaid Other | Admitting: Family Medicine

## 2022-05-02 ENCOUNTER — Ambulatory Visit: Payer: Medicaid Other | Admitting: Family Medicine

## 2022-07-04 ENCOUNTER — Other Ambulatory Visit: Payer: Self-pay

## 2022-07-04 ENCOUNTER — Other Ambulatory Visit (HOSPITAL_COMMUNITY)
Admission: RE | Admit: 2022-07-04 | Discharge: 2022-07-04 | Disposition: A | Discharge status: Home / Self Care | Payer: Medicaid Other | Source: Ambulatory Visit | Attending: Family Medicine | Admitting: Family Medicine

## 2022-07-04 ENCOUNTER — Encounter: Payer: Self-pay | Admitting: Family Medicine

## 2022-07-04 ENCOUNTER — Ambulatory Visit (INDEPENDENT_AMBULATORY_CARE_PROVIDER_SITE_OTHER): Payer: Medicaid Other | Admitting: Family Medicine

## 2022-07-04 VITALS — BP 123/82 | HR 89 | Ht 65.0 in | Wt 270.8 lb

## 2022-07-04 DIAGNOSIS — Z113 Encounter for screening for infections with a predominantly sexual mode of transmission: Secondary | ICD-10-CM | POA: Diagnosis not present

## 2022-07-04 DIAGNOSIS — Z3046 Encounter for surveillance of implantable subdermal contraceptive: Secondary | ICD-10-CM | POA: Diagnosis not present

## 2022-07-04 DIAGNOSIS — Z124 Encounter for screening for malignant neoplasm of cervix: Secondary | ICD-10-CM | POA: Diagnosis not present

## 2022-07-04 DIAGNOSIS — Z975 Presence of (intrauterine) contraceptive device: Secondary | ICD-10-CM

## 2022-07-04 LAB — POCT PREGNANCY, URINE: Preg Test, Ur: NEGATIVE

## 2022-07-04 MED ORDER — ETONOGESTREL 68 MG ~~LOC~~ IMPL
68.0000 mg | DRUG_IMPLANT | Freq: Once | SUBCUTANEOUS | Status: AC
  Administered 2022-07-04: 68 mg via SUBCUTANEOUS

## 2022-07-04 NOTE — Progress Notes (Signed)
     GYNECOLOGY OFFICE PROCEDURE NOTE  Melinda Richardson is a 27 y.o. [id number] here for Nexplanon removal and insertion.  Last pap smear:    Component Value Date/Time   DIAGPAP  05/20/2019 1314    - Negative for intraepithelial lesion or malignancy (NILM)   DIAGPAP  03/29/2018 0000    NEGATIVE FOR INTRAEPITHELIAL LESIONS OR MALIGNANCY.   ADEQPAP  05/20/2019 1314    Satisfactory for evaluation; transformation zone component PRESENT.   ADEQPAP  03/29/2018 0000    Satisfactory for evaluation. The absence of an endocervical / transformation zone component is not uncommon in pregnant patients.    Repeat collected today. Also desires STD testing.   UPT negative  Nexplanon removal and insertion Procedure Patient identified, informed consent performed, consent signed.   Patient does understand that irregular bleeding is a very common side effect of this medication. She was advised to have backup contraception for one week after replacement of the implant. Pregnancy test in clinic today was negative.  Appropriate time out taken. Nexplanon site identified. Area prepped in usual sterile fashon. One ml of 1% lidocaine was used to anesthetize the area at the distal end of the implant. A small stab incision was made right beside the implant on the distal portion. The Nexplanon rod was grasped using hemostats and removed without difficulty. There was minimal blood loss. There were no complications. Area was then injected with 3 ml of 1 % lidocaine. She was re-prepped with betadine, Nexplanon removed from packaging, Device confirmed in needle, then inserted full length of needle and withdrawn per handbook instructions. Nexplanon was able to palpated in the patient's arm; patient palpated the insert herself.  There was minimal blood loss. Patient insertion site covered with guaze and a pressure bandage to reduce any bruising. The patient tolerated the procedure well and was given post procedure instructions.   She was advised to have backup contraception for one week.    Clarnce Flock, MD/[facility], [facility] for [employer], [place]

## 2022-07-05 ENCOUNTER — Ambulatory Visit: Payer: Medicaid Other | Admitting: Family Medicine

## 2022-07-05 LAB — CYTOLOGY - PAP
Chlamydia: NEGATIVE
Comment: NEGATIVE
Comment: NEGATIVE
Comment: NORMAL
Diagnosis: NEGATIVE
Neisseria Gonorrhea: NEGATIVE
Trichomonas: POSITIVE — AB

## 2022-07-05 LAB — HIV ANTIBODY (ROUTINE TESTING W REFLEX): HIV Screen 4th Generation wRfx: NONREACTIVE

## 2022-07-05 LAB — RPR: RPR Ser Ql: NONREACTIVE

## 2022-07-06 ENCOUNTER — Other Ambulatory Visit: Payer: Self-pay | Admitting: Obstetrics and Gynecology

## 2022-07-06 ENCOUNTER — Telehealth: Payer: Self-pay

## 2022-07-06 DIAGNOSIS — A599 Trichomoniasis, unspecified: Secondary | ICD-10-CM

## 2022-07-06 MED ORDER — METRONIDAZOLE 500 MG PO TABS: 500.0000 mg | ORAL_TABLET | Freq: Two times a day (BID) | ORAL | 0 refills | Status: AC

## 2022-07-06 NOTE — Telephone Encounter (Signed)
-----   Message from Margarita Grizzle, [place] sent at 07/06/2022 12:35 PM EDT ----- Positive for Trich, abx sent, TOC one month

## 2022-07-06 NOTE — Telephone Encounter (Signed)
Call placed to pt. Spoke with pt. Pt given results per [facility], [place]. Pt verbalized understanding and agreeable to plan of care.  Pt will come back in 1 month for TOC.  Colletta [place], RNC

## 2022-07-13 ENCOUNTER — Encounter: Payer: Self-pay | Admitting: Obstetrics and Gynecology

## 2022-07-17 ENCOUNTER — Ambulatory Visit (INDEPENDENT_AMBULATORY_CARE_PROVIDER_SITE_OTHER): Payer: Medicaid Other | Admitting: Family Medicine

## 2022-07-17 VITALS — BP 124/83 | HR 86 | Temp 98.1°F | Resp 16 | Wt 269.7 lb

## 2022-07-17 DIAGNOSIS — Z794 Long term (current) use of insulin: Secondary | ICD-10-CM | POA: Diagnosis not present

## 2022-07-17 DIAGNOSIS — E1165 Type 2 diabetes mellitus with hyperglycemia: Secondary | ICD-10-CM

## 2022-07-17 DIAGNOSIS — Z6841 Body Mass Index (BMI) 40.0 and over, adult: Secondary | ICD-10-CM | POA: Diagnosis not present

## 2022-07-17 DIAGNOSIS — E66813 Obesity, class 3: Secondary | ICD-10-CM

## 2022-07-17 NOTE — Progress Notes (Unsigned)
Patient is here for their 6 month follow-up Patient has no concerns today Care gaps have been discussed with patient  

## 2022-07-18 ENCOUNTER — Ambulatory Visit: Payer: Medicaid Other | Attending: Family Medicine | Admitting: Pharmacist

## 2022-07-18 ENCOUNTER — Encounter: Payer: Self-pay | Admitting: Pharmacist

## 2022-07-18 ENCOUNTER — Other Ambulatory Visit: Payer: Self-pay

## 2022-07-18 DIAGNOSIS — E1165 Type 2 diabetes mellitus with hyperglycemia: Secondary | ICD-10-CM | POA: Diagnosis not present

## 2022-07-18 DIAGNOSIS — Z794 Long term (current) use of insulin: Secondary | ICD-10-CM

## 2022-07-18 MED ORDER — ACCU-CHEK SOFTCLIX LANCETS MISC
6 refills | Status: AC
Start: 2022-07-18 — End: ?
  Filled 2022-07-18: qty 100, 33d supply, fill #0
  Filled 2022-08-20 – 2022-09-14 (×2): qty 100, 33d supply, fill #1
  Filled 2022-10-12 – 2022-10-26 (×2): qty 100, 33d supply, fill #2
  Filled 2023-02-02: qty 100, 33d supply, fill #3
  Filled 2023-04-02 – 2023-04-06 (×3): qty 100, 33d supply, fill #4
  Filled 2023-05-11: qty 100, 33d supply, fill #5

## 2022-07-18 MED ORDER — METFORMIN HCL ER 500 MG PO TB24
500.0000 mg | ORAL_TABLET | Freq: Two times a day (BID) | ORAL | 1 refills | Status: DC
Start: 2022-07-18 — End: 2023-02-02
  Filled 2022-07-18: qty 180, 90d supply, fill #0
  Filled 2022-10-12 – 2022-10-26 (×2): qty 180, 90d supply, fill #1

## 2022-07-18 MED ORDER — INSULIN PEN NEEDLE 32G X 4 MM MISC
6 refills | Status: AC
  Filled 2022-07-18: qty 100, 90d supply, fill #0
  Filled 2022-10-12 – 2022-10-26 (×2): qty 100, 90d supply, fill #1
  Filled 2023-02-02 – 2023-04-06 (×3): qty 100, 90d supply, fill #2

## 2022-07-18 MED ORDER — ACCU-CHEK GUIDE W/DEVICE KIT
PACK | 0 refills | Status: AC
Start: 2022-07-18 — End: ?
  Filled 2022-07-18: qty 1, 1d supply, fill #0

## 2022-07-18 MED ORDER — OZEMPIC (0.25 OR 0.5 MG/DOSE) 2 MG/3ML ~~LOC~~ SOPN
0.2500 mg | PEN_INJECTOR | SUBCUTANEOUS | 1 refills | Status: DC
Start: 2022-07-18 — End: 2023-01-18
  Filled 2022-07-18: qty 3, 28d supply, fill #0
  Filled 2022-08-20: qty 3, 30d supply, fill #0
  Filled 2022-09-14: qty 3, 28d supply, fill #0
  Filled 2022-10-12 – 2022-11-13 (×4): qty 3, 28d supply, fill #1

## 2022-07-18 MED ORDER — ACCU-CHEK GUIDE VI STRP
ORAL_STRIP | 6 refills | Status: AC
Start: 2022-07-18 — End: ?
  Filled 2022-07-18: qty 100, 33d supply, fill #0
  Filled 2022-08-20 – 2022-09-14 (×2): qty 100, 33d supply, fill #1
  Filled 2022-10-12 – 2022-10-26 (×2): qty 100, 33d supply, fill #2
  Filled 2023-02-02: qty 100, 33d supply, fill #3

## 2022-07-18 MED ORDER — LANTUS SOLOSTAR 100 UNIT/ML ~~LOC~~ SOPN
40.0000 [IU] | PEN_INJECTOR | Freq: Every day | SUBCUTANEOUS | 2 refills | Status: DC
Start: 2022-07-18 — End: 2022-12-22
  Filled 2022-07-18: qty 15, 37d supply, fill #0
  Filled 2022-08-20 – 2022-09-14 (×2): qty 15, 37d supply, fill #1
  Filled 2022-10-26: qty 15, 37d supply, fill #2

## 2022-07-18 NOTE — Progress Notes (Signed)
    S:     No chief complaint on file.  26 y.o. female who presents for diabetes evaluation, education, and management.  PMH is significant for T2DM, obesity, currently on contraception via Neplanon.  Patient was referred and last seen by Primary Care Provider, Dr. Andrey Campanile, on 07/17/2022.   At last visit, A1c was >10%. Pt was referred to me for DM management. Today, patient arrives in good spirits and presents without any assistance.  Patient reports Diabetes was diagnosed 3 years ago. Found out via routine blood work. Denies any hospitalizations for DKA. No clinical ASCVD, CHF, or CKD in her hx. No thyroid cancer or pancreatitis hx.    Family/Social History:  -Fhx: DM, kidney failure  -Tobacco: current 0.25 PPD smoker -Alcohol: denies use   Current diabetes medications include: Levemir 20u BID, metformin 1000mg  BID Current hypertension medications include: none Current hyperlipidemia medications include: none  Patient reports adherence to taking all medications as prescribed.   Insurance coverage: [place] Medicaid  Patient denies hypoglycemic events.  Reported home fasting blood sugars: 280s - 300s   Patient denies nocturia (nighttime urination).  Patient reports neuropathy (nerve pain). Patient denies visual changes.  Patient reported dietary habits:  -Admits to dietary indiscretion -Drinks regular sodas but tries to limit -Does not limit carbs or sugary foods  Patient-reported exercise habits:  -Nothing formal. Will occasionally walk her children to the park and play basketball.   O:   ROS  Physical Exam  7 day average blood glucose: no GM with her today. Uses ReliOn.  No CGM in place.    Lab Results  Component Value Date   HGBA1C 13.4 (A) 11/03/2021   There were no vitals filed for this visit.  Lipid Panel     Component Value Date/Time   CHOL 172 05/20/2019 1350   TRIG 91 05/20/2019 1350   HDL 34 (L) 05/20/2019 1350   CHOLHDL 5.1 (H) 05/20/2019 1350    LDLCALC 121 (H) 05/20/2019 1350    Clinical Atherosclerotic Cardiovascular Disease (ASCVD): No   Patient is participating in a Managed Medicaid Plan:  No   A/P: Diabetes longstanding currently uncontrolled. Patient is able to verbalize appropriate hypoglycemia management plan. Medication adherence appears appropriate. Control is suboptimal due to dietary indiscretion and physical inactivity. -Discontinued Levemir.  -Start Lantus 40u daily in the morning.  -Started Ozempic 0.25 mg once weekly.  -Stop IR metformin. Start XR 500 mg BID. We'll see how she tolerates this.  -Accu Chek Guide supplies sent.  -Patient educated on purpose, proper use, and potential adverse effects of Lantus, Ozempic.  -Extensively discussed pathophysiology of diabetes, recommended lifestyle interventions, dietary effects on blood sugar control.  -Counseled on s/sx of and management of hypoglycemia.  -Next A1c anticipated 10/2022.   Written patient instructions provided. Patient verbalized understanding of treatment plan.  Total time in face to face counseling 30 minutes.    Follow-up:  Pharmacist in 1 month.  Butch Penny, PharmD, P[place]y [place], CPP Clinical Pharmacist [facility] [phone]

## 2022-07-19 ENCOUNTER — Other Ambulatory Visit: Payer: Self-pay

## 2022-07-19 ENCOUNTER — Encounter: Payer: Self-pay | Admitting: Family Medicine

## 2022-07-19 NOTE — Progress Notes (Signed)
Established Patient Office Visit  Subjective    Patient ID: Melinda Richardson, female    DOB: 08-Aug-1995  Age: 27 y.o. MRN: [id number]  CC: No chief complaint on file.   HPI [employer] presents for follow up of chronic med issues   Outpatient Encounter Medications as of 07/17/2022  Medication Sig   Continuous Blood Gluc Sensor (FREESTYLE LIBRE 2 SENSOR) MISC Utilize one sensor q14 days to monitor blood glucose   [DISCONTINUED] insulin detemir (LEVEMIR FLEXTOUCH) 100 UNIT/ML FlexPen 20 units SQ BID   [DISCONTINUED] metFORMIN (GLUCOPHAGE) 1000 MG tablet Take 1 tablet (1,000 mg total) by mouth 2 (two) times daily with a meal.   No facility-administered encounter medications on file as of 07/17/2022.    Past Medical History:  Diagnosis Date   Anemia    Anxiety    Arthritis    Phreesia 06/20/2020   Diabetes mellitus without complication    Phreesia 06/20/2020   Preterm labor    Preterm premature rupture of membranes (PPROM) with unknown onset of labor 12/11/2014    Past Surgical History:  Procedure Laterality Date   CESAREAN SECTION N/A 12/17/2014   Procedure: CESAREAN SECTION;  Surgeon: [name], MD;  Location: [facility];  Service: Obstetrics;  Laterality: N/A;   CESAREAN SECTION N/A 04/05/2017   Procedure: CESAREAN SECTION;  Surgeon: [name], DO;  Location: [facility] BIRTH[facility] SU[facility];  Service: Obstetrics;  Laterality: N/A;   CESAREAN SECTION N/A 08/30/2018   Procedure: CESAREAN SECTION;  Surgeon: [name], MD;  Location: [facility] LD ORS;  Service: Obstetrics;  Laterality: N/A;   CESAREAN SECTION N/A    Phreesia 06/20/2020   TONSILLECTOMY      Family History  Problem Relation Age of Onset   Kidney failure Father    Diabetes Mother     Social History   Socioeconomic History   Marital status: Single    Spouse name: Not on file   Number of children: Not on file   Years of education: Not on file   Highest education level: Not on file  Occupational History    Not on file  Tobacco Use   Smoking status: Every Day    Packs/day: .25    Types: Cigarettes   Smokeless tobacco: Never  Vaping Use   Vaping Use: Never used  Substance and Sexual Activity   Alcohol use: No   Drug use: Yes    Types: Marijuana   Sexual activity: Not Currently    Birth control/protection: None    Comment: desires Nexplanon  Other Topics Concern   Not on file  Social History Narrative   ** Merged History Encounter **       Social Determinants of Health   Financial Resource Strain: Low Risk  (07/18/2022)   Overall Financial Resource Strain (CARDIA)    Difficulty of Paying Living Expenses: Not hard at all  Food Insecurity: No Food Insecurity (07/18/2022)   Hunger Vital Sign    Worried About Running Out of Food in the Last Year: Never true    Ran Out of Food in the Last Year: Never true  Transportation Needs: No Transportation Needs (07/04/2022)   PRAPARE - Administrator, Civil Service (Medical): No    Lack of Transportation (Non-Medical): No  Physical Activity: Inactive (07/18/2022)   Exercise Vital Sign    Days of Exercise per Week: 0 days    Minutes of Exercise per Session: 0 min  Stress: No Stress Concern Present (  07/18/2022)   [employer] - Occupational Stress Questionnaire    Feeling of Stress : Not at all  Social Connections: Not on file  Intimate Partner Violence: Not At Risk (07/18/2022)   Humiliation, Afraid, Rape, and Kick questionnaire    Fear of Current or Ex-Partner: No    Emotionally Abused: No    Physically Abused: No    Sexually Abused: No    Review of Systems  All other systems reviewed and are negative.       Objective    BP 124/83   Pulse 86   Temp 98.1 F (36.7 C) (Oral)   Resp 16   Wt 269 lb 11.2 oz (122.3 kg)   LMP  (LMP Unknown)   SpO2 95%   BMI 44.88 kg/m   Physical Exam Vitals and nursing note reviewed.  Constitutional:      General: She is not in acute distress.    Appearance:  She is obese.  Cardiovascular:     Rate and Rhythm: Normal rate and regular rhythm.  Pulmonary:     Effort: Pulmonary effort is normal.     Breath sounds: Normal breath sounds.  Abdominal:     Palpations: Abdomen is soft.     Tenderness: There is no abdominal tenderness.  Neurological:     General: No focal deficit present.     Mental Status: She is alert and oriented to person, place, and time.  Psychiatric:        Mood and Affect: Mood normal.        Behavior: Behavior normal.         Assessment & Plan:   1. Uncontrolled diabetes mellitus with hyperglycemia, with long-term current use of insulin Slightly decreased A1c but no where near goal. Discussed compliance. Patient referred to [facility] for med management  2. Class 3 severe obesity due to excess calories with serious comorbidity and body mass index (BMI) of 45.0 to 49.9 in adult Discussed dietary and activity options.     Return in about 4 months (around 11/16/2022) for follow up.   [name], MD

## 2022-07-26 ENCOUNTER — Other Ambulatory Visit: Payer: Self-pay

## 2022-07-31 ENCOUNTER — Encounter (HOSPITAL_COMMUNITY): Payer: Self-pay | Admitting: Emergency Medicine

## 2022-07-31 ENCOUNTER — Ambulatory Visit (HOSPITAL_COMMUNITY)
Admission: EM | Admit: 2022-07-31 | Discharge: 2022-07-31 | Disposition: A | Discharge status: Home / Self Care | Payer: Medicaid Other | Attending: Family Medicine | Admitting: Family Medicine

## 2022-07-31 DIAGNOSIS — R062 Wheezing: Secondary | ICD-10-CM

## 2022-07-31 DIAGNOSIS — J069 Acute upper respiratory infection, unspecified: Secondary | ICD-10-CM

## 2022-07-31 MED ORDER — PREDNISONE 20 MG PO TABS: 40.0000 mg | ORAL_TABLET | Freq: Every day | ORAL | 0 refills | Status: DC

## 2022-07-31 NOTE — ED Provider Notes (Signed)
[facility]   [phone] 07/31/22 Arrival Time: 1324  ASSESSMENT & PLAN:  1. Viral URI   2. Wheezing    Discussed typical duration of likely viral illness. Is wheezing; no respiratory distress. OTC symptom care as needed.  New Prescriptions   PREDNISONE (DELTASONE) 20 MG TABLET    Take 2 tablets (40 mg total) by mouth daily.     Follow-up Information     Georganna Skeans, MD.   Specialty: Family Medicine Why: If worsening or failing to improve as anticipated. Contact information: [address] [place] [address] [phone]                 Reviewed expectations re: course of current medical issues. Questions answered. Outlined signs and symptoms indicating need for more acute intervention. Understanding verbalized. After Visit Summary given.   SUBJECTIVE: History from: Patient. Melinda Richardson is a 27 y.o. female. Pt c/o sinus congestion and pain, chills, neck pain since Friday. Took Advil and soon as it ran out pains are back.  Denies: fever and difficulty breathing. Feels she is wheezing at times. Normal PO intake without n/v/d.  OBJECTIVE:  Vitals:   07/31/22 1014  BP: 113/80  Pulse: 74  Resp: 17  Temp: 97.9 F (36.6 C)  TempSrc: Oral  SpO2: 97%    General appearance: alert; no distress Eyes: PERRLA; EOMI; conjunctiva normal HENT: [place]; AT; with nasal congestion; throat cobblestoning Neck: supple  Lungs: speaks full sentences without difficulty; unlabored; bilat exp wheezing present Extremities: no edema Skin: warm and dry Neurologic: normal gait Psychological: alert and cooperative; normal mood and affect   No Known Allergies  Past Medical History:  Diagnosis Date   Anemia    Anxiety    Arthritis    Phreesia 06/20/2020   Diabetes mellitus without complication    Phreesia 06/20/2020   Preterm labor    Preterm premature rupture of membranes (PPROM) with unknown onset of labor 12/11/2014   Social History    Socioeconomic History   Marital status: Single    Spouse name: Not on file   Number of children: Not on file   Years of education: Not on file   Highest education level: Not on file  Occupational History   Not on file  Tobacco Use   Smoking status: Every Day    Packs/day: .25    Types: Cigarettes   Smokeless tobacco: Never  Vaping Use   Vaping Use: Never used  Substance and Sexual Activity   Alcohol use: No   Drug use: Yes    Types: Marijuana   Sexual activity: Not Currently    Birth control/protection: None    Comment: desires Nexplanon  Other Topics Concern   Not on file  Social History Narrative   ** Merged History Encounter **       Social Determinants of Health   Financial Resource Strain: Low Risk  (07/18/2022)   Overall Financial Resource Strain (CARDIA)    Difficulty of Paying Living Expenses: Not hard at all  Food Insecurity: No Food Insecurity (07/18/2022)   Hunger Vital Sign    Worried About Radiation protection practitioner of Food in the Last Year: Never true    Ran Out of Food in the Last Year: Never true  Transportation Needs: No Transportation Needs (07/04/2022)   PRAPARE - Administrator, Civil Service (Medical): No    Lack of Transportation (Non-Medical): No  Physical Activity: Inactive (07/18/2022)   Exercise Vital Sign  Days of Exercise per Week: 0 days    Minutes of Exercise per Session: 0 min  Stress: No Stress Concern Present (07/18/2022)   [employer] of Occupational Health - Occupational Stress Questionnaire    Feeling of Stress : Not at all  Social Connections: Not on file  Intimate Partner Violence: Not At Risk (07/18/2022)   Humiliation, Afraid, Rape, and Kick questionnaire    Fear of Current or Ex-Partner: No    Emotionally Abused: No    Physically Abused: No    Sexually Abused: No   Family History  Problem Relation Age of Onset   Kidney failure Father    Diabetes Mother    Past Surgical History:  Procedure Laterality Date   CESAREAN  SECTION N/A 12/17/2014   Procedure: CESAREAN SECTION;  Surgeon: [name], MD;  Location: [facility];  Service: Obstetrics;  Laterality: N/A;   CESAREAN SECTION N/A 04/05/2017   Procedure: CESAREAN SECTION;  Surgeon: [name], DO;  Location: [facility] BIRTHING SU[facility];  Service: Obstetrics;  Laterality: N/A;   CESAREAN SECTION N/A 08/30/2018   Procedure: CESAREAN SECTION;  Surgeon: [name], MD;  Location: [facility] LD ORS;  Service: Obstetrics;  Laterality: N/A;   CESAREAN SECTION N/A    Phreesia 06/20/2020   TONSILLECTOMY       Mardella Layman, MD 07/31/22 1125

## 2022-07-31 NOTE — Discharge Instructions (Signed)
Bw aware, your blood sugars will run higher than normal while taking prednisone. Monitor closely.

## 2022-07-31 NOTE — ED Triage Notes (Signed)
Pt c/o sinus congestion and pain, chills, neck pain since Friday. Took Advil and soon as it ran out pains are back.

## 2022-08-03 ENCOUNTER — Ambulatory Visit: Payer: Medicaid Other

## 2022-08-07 ENCOUNTER — Ambulatory Visit: Payer: Medicaid Other

## 2022-08-17 ENCOUNTER — Ambulatory Visit: Payer: Medicaid Other | Admitting: Pharmacist

## 2022-08-21 ENCOUNTER — Other Ambulatory Visit (HOSPITAL_COMMUNITY): Payer: Self-pay

## 2022-08-21 ENCOUNTER — Other Ambulatory Visit: Payer: Self-pay

## 2022-08-22 ENCOUNTER — Other Ambulatory Visit: Payer: Self-pay

## 2022-08-29 ENCOUNTER — Other Ambulatory Visit (HOSPITAL_COMMUNITY): Payer: Self-pay

## 2022-09-14 ENCOUNTER — Other Ambulatory Visit: Payer: Self-pay

## 2022-09-24 ENCOUNTER — Encounter (HOSPITAL_COMMUNITY): Payer: Self-pay | Admitting: Emergency Medicine

## 2022-09-24 ENCOUNTER — Ambulatory Visit (HOSPITAL_COMMUNITY)
Admission: EM | Admit: 2022-09-24 | Discharge: 2022-09-24 | Disposition: A | Discharge status: Home / Self Care | Payer: Medicaid Other | Attending: Emergency Medicine | Admitting: Emergency Medicine

## 2022-09-24 DIAGNOSIS — R0789 Other chest pain: Secondary | ICD-10-CM

## 2022-09-24 MED ORDER — IBUPROFEN 600 MG PO TABS: 600.0000 mg | ORAL_TABLET | Freq: Three times a day (TID) | ORAL | 0 refills | Status: DC | PRN

## 2022-09-24 MED ORDER — KETOROLAC TROMETHAMINE 30 MG/ML IJ SOLN
30.0000 mg | Freq: Once | INTRAMUSCULAR | Status: AC
  Administered 2022-09-24: 30 mg via INTRAMUSCULAR

## 2022-09-24 MED ORDER — KETOROLAC TROMETHAMINE 30 MG/ML IJ SOLN
INTRAMUSCULAR | Status: AC
  Filled 2022-09-24: qty 1

## 2022-09-24 MED ORDER — BACLOFEN 10 MG PO TABS: 10.0000 mg | ORAL_TABLET | Freq: Every day | ORAL | 0 refills | Status: AC

## 2022-09-24 NOTE — Discharge Instructions (Signed)
The mainstay of therapy for musculoskeletal pain is reduction of inflammation and relaxation of tension which is causing inflammation.  Keep in mind, pain always begets more pain.  To help you stay ahead of your pain and inflammation, I have provided the following regimen for you:   During your visit today, you received an injection of ketorolac, high-dose nonsteroidal anti-inflammatory pain medication that should significantly reduce your pain for the next 6 to 8 hours.   This evening, please begin taking baclofen 10 mg.  This is a highly effective muscle relaxer and antispasmodic which should continue to provide you with relaxation of your tense muscles, allow you to sleep well and to keep your pain under control.  This evening, please begin taking ibuprofen 600 mg 3 times daily.  Please keep in mind that it is always easier to treat a little bit of pain that is to treat a lot of pain.  I recommend that for the next several days, you take this medication on a scheduled basis.  After that, take it when you begin to feel the pain returning, do not wait until you are in a lot of pain.  During the day, please set aside time to apply ice to the affected area 4 times daily for 20 minutes each application.  This can be achieved by using a bag of frozen peas or corn, a Ziploc bag filled with ice and water, or Ziploc bag filled with half rubbing alcohol and half Dawn dish detergent, frozen into a slush.  Please be careful not to apply ice directly to your skin, always place a soft cloth between you and the ice pack.  Over-the-counter products such as IcyHot and Biofreeze do not work nearly as well.   Please consider discussing referral to physical therapy with your primary care provider.  Physical therapist are very good at teasing out the underlying cause of acute musculoskeletal pain and helping with prevention of future recurrences.   Please avoid attempts to stretch or strengthen the affected area until you  are feeling completely pain-free.  Attempts to do so will only prolong the healing process.   I also recommend that you remain out of work for the next several days, I provided you with a note to return to work in 3 days.  If you feel that you need this time extended, please follow-up with your primary care provider or return to urgent care for reevaluation so that we can provide you with a note for another 3 days.   Thank you for visiting [facility] today.  We appreciate the opportunity to participate in your care.

## 2022-09-24 NOTE — ED Triage Notes (Signed)
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 Took Advil this morning for the pain but had to leave work.

## 2022-09-24 NOTE — ED Provider Notes (Signed)
[facility]    CSN: [id number] Arrival date & time: 09/24/22  1034    HISTORY   Chief Complaint  Patient presents with   Chest Pain   HPI Melinda Richardson is a pleasant, 27 y.o. female who presents to urgent care today. 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  States she took Advil 400 mg early this morning for the pain which did not help so she had to leave work.   The history is provided by the patient.   Past Medical History:  Diagnosis Date   Anemia    Anxiety    Arthritis    Phreesia 06/20/2020   Diabetes mellitus without complication (HCC)    Phreesia 06/20/2020   Preterm labor    Preterm premature rupture of membranes (PPROM) with unknown onset of labor 12/11/2014   Patient Active Problem List   Diagnosis Date Noted   Trichomonas infection 07/06/2022   Nexplanon in place 07/04/2022   Class 3 severe obesity without serious comorbidity with body mass index (BMI) of 50.0 to 59.9 in adult [facility]) 05/20/2019   History of cesarean delivery 04/05/2017   Past Surgical History:  Procedure Laterality Date   CESAREAN SECTION N/A 12/17/2014   Procedure: CESAREAN SECTION;  Surgeon: [name], MD;  Location: [facility];  Service: Obstetrics;  Laterality: N/A;   CESAREAN SECTION N/A 04/05/2017   Procedure: CESAREAN SECTION;  Surgeon: [name], DO;  Location: [facility];  Service: Obstetrics;  Laterality: N/A;   CESAREAN SECTION N/A 08/30/2018   Procedure: CESAREAN SECTION;  Surgeon: [name], MD;  Location: [facility];  Service: Obstetrics;  Laterality: N/A;   CESAREAN SECTION N/A    Phreesia 06/20/2020   TONSILLECTOMY     OB History     Gravida  3   Para  3   Term  1   Preterm  2   AB  0   Living  2      SAB  0   IAB  0   Ectopic  0   Multiple  0   Live Births  3           Home Medications    Prior to Admission medications   Medication Sig Start Date End Date Taking? Authorizing Provider  Accu-Chek Softclix Lancets lancets Use to check blood sugar three times daily. 07/18/22   Hoy Register, MD  Blood Glucose Monitoring Suppl (ACCU-CHEK GUIDE) w/Device KIT Use to check blood sugar three times daily. 07/18/22   Hoy Register, MD  Continuous Blood Gluc Sensor (FREESTYLE LIBRE 2 SENSOR) MISC Utilize one sensor q14 days to monitor blood glucose 03/07/21   Georganna Skeans, MD  glucose blood (ACCU-CHEK GUIDE) test strip Use to check blood sugar three times daily. 07/18/22   Hoy Register, MD  insulin glargine (LANTUS SOLOSTAR) 100 UNIT/ML Solostar Pen Inject 40 Units into the skin daily. 07/18/22   Hoy Register, MD  Insulin Pen Needle 32G X 4 MM MISC Use to inject Lantus once daily. 07/18/22   Hoy Register, MD  metFORMIN (GLUCOPHAGE-XR) 500 MG 24 hr tablet Take 1 tablet (500 mg total) by mouth 2 (two) times daily with a meal. 07/18/22   Hoy Register, MD  predniSONE (DELTASONE) 20 MG tablet Take 2 tablets (40 mg total) by mouth daily. 07/31/22  Mardella Layman, MD  Semaglutide,0.25 or 0.5MG /DOS, (OZEMPIC, 0.25 OR 0.5 MG/DOSE,) 2 MG/3ML SOPN Inject 0.25 mg into the skin once a week. 07/18/22   Hoy Register, MD    Family History Family History  Problem Relation Age of Onset   Kidney failure Father    Diabetes Mother    Social History Social History   Tobacco Use   Smoking status: Every Day    Packs/day: .25    Types: Cigarettes   Smokeless tobacco: Never  Vaping Use   Vaping Use: Never used  Substance Use Topics   Alcohol use: No   Drug use: Yes    Types: Marijuana   Allergies   Patient has no known allergies.  Review of Systems Review of Systems Pertinent findings revealed after performing a 14 point review of systems has been noted in the history of present illness.  Physical Exam Vital Signs BP 111/72 (BP Location: Left Arm)   Pulse 75    Temp 98.1 F (36.7 C) (Oral)   Resp 17   LMP 09/15/2022 (Approximate)   No data found.  Physical Exam Vitals and nursing note reviewed.  Constitutional:      General: She is not in acute distress.    Appearance: Normal appearance.  HENT:     Head: Normocephalic and atraumatic.  Eyes:     Pupils: Pupils are equal, round, and reactive to light.  Cardiovascular:     Rate and Rhythm: Normal rate and regular rhythm.  Pulmonary:     Effort: Pulmonary effort is normal.     Breath sounds: Normal breath sounds.  Musculoskeletal:        General: Normal range of motion.       Arms:     Cervical back: Normal range of motion and neck supple.  Skin:    General: Skin is warm and dry.  Neurological:     General: No focal deficit present.     Mental Status: She is alert and oriented to person, place, and time. Mental status is at baseline.  Psychiatric:        Mood and Affect: Mood normal.        Behavior: Behavior normal.        Thought Content: Thought content normal.        Judgment: Judgment normal.     Visual Acuity Right Eye Distance:   Left Eye Distance:   Bilateral Distance:    Right Eye Near:   Left Eye Near:    Bilateral Near:     UC Couse / Diagnostics / Procedures:     Radiology No results found.  Procedures Procedures (including critical care time) EKG  Pending results:  Labs Reviewed - No data to display  Medications Ordered in UC: Medications  ketorolac (TORADOL) 30 MG/ML injection 30 mg (30 mg Intramuscular Given 09/24/22 1206)    UC Diagnoses / Final Clinical Impressions(s)   I have reviewed the triage vital signs and the nursing notes.  Pertinent labs & imaging results that were available during my care of the patient were reviewed by me and considered in my medical decision making (see chart for details).    Final diagnoses:  Musculoskeletal chest pain   Patient provided with NSAIDs pain relief and baclofen for relief of muscle spasm likely  causing pain.  Recommend ice to affected area as well to reduce inflammation.  Please see discharge instructions below for details of plan of care as provided to patient. ED Prescriptions  Medication Sig Dispense Auth. Provider   baclofen (LIORESAL) 10 MG tablet Take 1 tablet (10 mg total) by mouth at bedtime for 7 days. 7 tablet Theadora Rama Scales, PA-C   ibuprofen (ADVIL) 600 MG tablet Take 1 tablet (600 mg total) by mouth every 8 (eight) hours as needed for up to 30 doses for fever, headache, mild pain or moderate pain (Inflammation). Take 1 tablet 3 times daily as needed for inflammation of upper airways and/or pain. 30 tablet Theadora Rama Scales, PA-C      PDMP not reviewed this encounter.  Pending results:  Labs Reviewed - No data to display  Discharge Instructions:   Discharge Instructions      The mainstay of therapy for musculoskeletal pain is reduction of inflammation and relaxation of tension which is causing inflammation.  Keep in mind, pain always begets more pain.  To help you stay ahead of your pain and inflammation, I have provided the following regimen for you:   During your visit today, you received an injection of ketorolac, high-dose nonsteroidal anti-inflammatory pain medication that should significantly reduce your pain for the next 6 to 8 hours.   This evening, please begin taking baclofen 10 mg.  This is a highly effective muscle relaxer and antispasmodic which should continue to provide you with relaxation of your tense muscles, allow you to sleep well and to keep your pain under control.  This evening, please begin taking ibuprofen 600 mg 3 times daily.  Please keep in mind that it is always easier to treat a little bit of pain that is to treat a lot of pain.  I recommend that for the next several days, you take this medication on a scheduled basis.  After that, take it when you begin to feel the pain returning, do not wait until you are in a lot of  pain.  During the day, please set aside time to apply ice to the affected area 4 times daily for 20 minutes each application.  This can be achieved by using a bag of frozen peas or corn, a Ziploc bag filled with ice and water, or Ziploc bag filled with half rubbing alcohol and half Dawn dish detergent, frozen into a slush.  Please be careful not to apply ice directly to your skin, always place a soft cloth between you and the ice pack.  Over-the-counter products such as IcyHot and Biofreeze do not work nearly as well.   Please consider discussing referral to physical therapy with your primary care provider.  Physical therapist are very good at teasing out the underlying cause of acute musculoskeletal pain and helping with prevention of future recurrences.   Please avoid attempts to stretch or strengthen the affected area until you are feeling completely pain-free.  Attempts to do so will only prolong the healing process.   I also recommend that you remain out of work for the next several days, I provided you with a note to return to work in 3 days.  If you feel that you need this time extended, please follow-up with your primary care provider or return to urgent care for reevaluation so that we can provide you with a note for another 3 days.   Thank you for visiting [facility] today.  We appreciate the opportunity to participate in your care.       Disposition Upon Discharge:  Condition: stable for discharge home  Patient presented with an acute illness with associated systemic symptoms and significant  discomfort requiring urgent management. In my opinion, this is a condition that a prudent lay person (someone who possesses an average knowledge of health and medicine) may potentially expect to result in complications if not addressed urgently such as respiratory distress, impairment of bodily function or dysfunction of bodily organs.   Routine symptom specific, illness specific  and/or disease specific instructions were discussed with the patient and/or caregiver at length.   As such, the patient has been evaluated and assessed, work-up was performed and treatment was provided in alignment with urgent care protocols and evidence based medicine.  Patient/parent/caregiver has been advised that the patient may require follow up for further testing and treatment if the symptoms continue in spite of treatment, as clinically indicated and appropriate.  Patient/parent/caregiver has been advised to return to the [facility] or PCP if no better; to PCP or the Emergency Department if new signs and symptoms develop, or if the current signs or symptoms continue to change or worsen for further workup, evaluation and treatment as clinically indicated and appropriate  The patient will follow up with their current PCP if and as advised. If the patient does not currently have a PCP we will assist them in obtaining one.   The patient may need specialty follow up if the symptoms continue, in spite of conservative treatment and management, for further workup, evaluation, consultation and treatment as clinically indicated and appropriate.  Patient/parent/caregiver verbalized understanding and agreement of plan as discussed.  All questions were addressed during visit.  Please see discharge instructions below for further details of plan.  This office note has been dictated using Teaching laboratory technician.  Unfortunately, this method of dictation can sometimes lead to typographical or grammatical errors.  I apologize for your inconvenience in advance if this occurs.  Please do not hesitate to reach out to me if clarification is needed.      Theadora Rama Scales, PA-C 09/24/22 1542

## 2022-10-13 ENCOUNTER — Other Ambulatory Visit: Payer: Self-pay

## 2022-10-21 ENCOUNTER — Other Ambulatory Visit (HOSPITAL_COMMUNITY): Payer: Self-pay

## 2022-10-26 ENCOUNTER — Other Ambulatory Visit: Payer: Self-pay

## 2022-11-08 ENCOUNTER — Other Ambulatory Visit: Payer: Self-pay

## 2022-11-13 ENCOUNTER — Other Ambulatory Visit: Payer: Self-pay

## 2022-11-20 ENCOUNTER — Ambulatory Visit: Payer: Medicaid Other | Admitting: Family Medicine

## 2022-11-20 ENCOUNTER — Encounter: Payer: Self-pay | Admitting: Family Medicine

## 2022-12-04 ENCOUNTER — Ambulatory Visit: Payer: Medicaid Other | Admitting: Family Medicine

## 2022-12-05 ENCOUNTER — Ambulatory Visit: Payer: Medicaid Other | Admitting: Family Medicine

## 2022-12-21 ENCOUNTER — Encounter: Payer: Medicaid Other | Admitting: Obstetrics and Gynecology

## 2022-12-22 ENCOUNTER — Other Ambulatory Visit: Payer: Self-pay | Admitting: Family Medicine

## 2022-12-22 DIAGNOSIS — E1165 Type 2 diabetes mellitus with hyperglycemia: Secondary | ICD-10-CM

## 2022-12-22 NOTE — Telephone Encounter (Signed)
Medication Refill - Medication: Rx #: [id number]  insulin glargine (LANTUS SOLOSTAR) 100 UNIT/ML Solostar Pen [[id number]]   Has the patient contacted their pharmacy? Yes.   (Agent: If no, request that the patient contact the pharmacy for the refill. If patient does not wish to contact the pharmacy document the reason why and proceed with request.) (Agent: If yes, when and what did the pharmacy advise?)  Preferred Pharmacy (with phone number or street name):  [facility] Phone: [phone]  Fax: [phone]     Has the patient been seen for an appointment in the last year OR does the patient have an upcoming appointment? Yes.    Agent: Please be advised that RX refills may take up to 3 business days. We ask that you follow-up with your pharmacy.

## 2022-12-25 ENCOUNTER — Other Ambulatory Visit: Payer: Self-pay

## 2022-12-25 MED ORDER — LANTUS SOLOSTAR 100 UNIT/ML ~~LOC~~ SOPN
40.0000 [IU] | PEN_INJECTOR | Freq: Every day | SUBCUTANEOUS | 2 refills | Status: DC
Start: 2022-12-25 — End: 2023-05-11
  Filled 2022-12-25: qty 15, 37d supply, fill #0
  Filled 2023-02-02: qty 15, 37d supply, fill #1
  Filled 2023-04-02 – 2023-04-06 (×2): qty 15, 37d supply, fill #2

## 2022-12-25 NOTE — Telephone Encounter (Signed)
Requested Prescriptions  Pending Prescriptions Disp Refills   insulin glargine (LANTUS SOLOSTAR) 100 UNIT/ML Solostar Pen 15 mL 2    Sig: Inject 40 Units into the skin daily.     Endocrinology:  Diabetes - Insulins Failed - 12/22/2022  1:51 PM      Failed - HBA1C is between 0 and 7.9 and within 180 days    Hemoglobin A1C  Date Value Ref Range Status  11/03/2021 13.4 (A) 4.0 - 5.6 % Final   HbA1c, POC (controlled diabetic range)  Date Value Ref Range Status  11/29/2020   Corrected    Comment:    greater than 15%         Passed - Valid encounter within last 6 months    Recent Outpatient Visits           5 months ago Uncontrolled type 2 diabetes mellitus with hyperglycemia, with long-term current use of insulin [facility])   [facility] [place], [place] L, RPH-CPP   5 months ago Uncontrolled diabetes mellitus with hyperglycemia, with long-term current use of insulin (HCC)   [facility], Lauris Poag, MD   1 year ago Uncontrolled diabetes mellitus with hyperglycemia, with long-term current use of insulin (HCC)   [facility] [facility], MD   1 year ago Yeast vaginitis   [place] [facility], Lauris Poag, MD   1 year ago Type 2 diabetes mellitus without complication, with long-term current use of insulin [facility])   [place] [facility], MD       Future Appointments             In 1 week Georganna Skeans, MD [facility]

## 2022-12-27 ENCOUNTER — Other Ambulatory Visit: Payer: Self-pay

## 2023-01-02 ENCOUNTER — Ambulatory Visit (INDEPENDENT_AMBULATORY_CARE_PROVIDER_SITE_OTHER): Payer: Medicaid Other | Admitting: Family Medicine

## 2023-01-02 ENCOUNTER — Encounter: Payer: Self-pay | Admitting: Family Medicine

## 2023-01-02 VITALS — BP 110/70 | HR 94 | Temp 97.6°F | Resp 16 | Wt 283.2 lb

## 2023-01-02 DIAGNOSIS — E1169 Type 2 diabetes mellitus with other specified complication: Secondary | ICD-10-CM

## 2023-01-02 DIAGNOSIS — Z794 Long term (current) use of insulin: Secondary | ICD-10-CM | POA: Diagnosis not present

## 2023-01-02 DIAGNOSIS — E119 Type 2 diabetes mellitus without complications: Secondary | ICD-10-CM

## 2023-01-02 DIAGNOSIS — Z7984 Long term (current) use of oral hypoglycemic drugs: Secondary | ICD-10-CM

## 2023-01-03 ENCOUNTER — Encounter: Payer: Self-pay | Admitting: Family Medicine

## 2023-01-03 LAB — MICROALBUMIN / CREATININE URINE RATIO
Creatinine, Urine: 131.8 mg/dL
Microalb/Creat Ratio: 27 mg/g creat (ref 0–29)
Microalbumin, Urine: 35.3 ug/mL

## 2023-01-03 NOTE — Progress Notes (Unsigned)
Established Patient Office Visit  Subjective    Patient ID: Melinda Richardson, female    DOB: June 25, 1995  Age: 27 y.o. MRN: [id number]  CC:  Chief Complaint  Patient presents with   Medical Management of Chronic Issues    HPI Melinda Richardson presents for follow up of chronic med issues. Patient denies acute complaints or concerns.   Outpatient Encounter Medications as of 01/02/2023  Medication Sig   Accu-Chek Softclix Lancets lancets Use to check blood sugar three times daily.   Blood Glucose Monitoring Suppl (ACCU-CHEK GUIDE) w/Device KIT Use to check blood sugar three times daily.   Continuous Blood Gluc Sensor (FREESTYLE LIBRE 2 SENSOR) MISC Utilize one sensor q14 days to monitor blood glucose   glucose blood (ACCU-CHEK GUIDE) test strip Use to check blood sugar three times daily.   ibuprofen (ADVIL) 600 MG tablet Take 1 tablet (600 mg total) by mouth every 8 (eight) hours as needed for up to 30 doses for fever, headache, mild pain or moderate pain (Inflammation). Take 1 tablet 3 times daily as needed for inflammation of upper airways and/or pain.   insulin glargine (LANTUS SOLOSTAR) 100 UNIT/ML Solostar Pen Inject 40 Units into the skin daily.   Insulin Pen Needle 32G X 4 MM MISC Use to inject Lantus once daily.   metFORMIN (GLUCOPHAGE-XR) 500 MG 24 hr tablet Take 1 tablet (500 mg total) by mouth 2 (two) times daily with a meal.   Semaglutide,0.25 or 0.5MG /DOS, (OZEMPIC, 0.25 OR 0.5 MG/DOSE,) 2 MG/3ML SOPN Inject 0.25 mg into the skin once a week.   No facility-administered encounter medications on file as of 01/02/2023.    Past Medical History:  Diagnosis Date   Anemia    Anxiety    Arthritis    Phreesia 06/20/2020   Diabetes mellitus without complication (HCC)    Phreesia 06/20/2020   Preterm labor    Preterm premature rupture of membranes (PPROM) with unknown onset of labor 12/11/2014    Past Surgical History:  Procedure Laterality Date   CESAREAN SECTION N/A  12/17/2014   Procedure: CESAREAN SECTION;  Surgeon: [name], MD;  Location: [facility];  Service: Obstetrics;  Laterality: N/A;   CESAREAN SECTION N/A 04/05/2017   Procedure: CESAREAN SECTION;  Surgeon: [name], DO;  Location: [facility];  Service: Obstetrics;  Laterality: N/A;   CESAREAN SECTION N/A 08/30/2018   Procedure: CESAREAN SECTION;  Surgeon: [name], MD;  Location: [facility];  Service: Obstetrics;  Laterality: N/A;   CESAREAN SECTION N/A    Phreesia 06/20/2020   TONSILLECTOMY      Family History  Problem Relation Age of Onset   Kidney failure Father    Diabetes Mother     Social History   Socioeconomic History   Marital status: Single    Spouse name: Not on file   Number of children: Not on file   Years of education: Not on file   Highest education level: Not on file  Occupational History   Not on file  Tobacco Use   Smoking status: Every Day    Current packs/day: 0.25    Types: Cigarettes   Smokeless tobacco: Never  Vaping Use   Vaping status: Never Used  Substance and Sexual Activity   Alcohol use: No   Drug use: Yes    Types: Marijuana   Sexual activity: Not Currently    Birth control/protection: None    Comment: desires Nexplanon  Other Topics Concern  Not on file  Social History Narrative   ** Merged History Encounter **       Social Determinants of Health   Financial Resource Strain: Low Risk  (07/18/2022)   Overall Financial Resource Strain (CARDIA)    Difficulty of Paying Living Expenses: Not hard at all  Food Insecurity: No Food Insecurity (07/18/2022)   Hunger Vital Sign    Worried About Running Out of Food in the Last Year: Never true    Ran Out of Food in the Last Year: Never true  Transportation Needs: No Transportation Needs (07/04/2022)   PRAPARE - Administrator, Civil Service (Medical): No    Lack of Transportation (Non-Medical): No  Physical Activity: Inactive (07/18/2022)   Exercise Vital Sign     Days of Exercise per Week: 0 days    Minutes of Exercise per Session: 0 min  Stress: No Stress Concern Present (07/18/2022)   [employer] of Occupational Health - Occupational Stress Questionnaire    Feeling of Stress : Not at all  Social Connections: Not on file  Intimate Partner Violence: Not At Risk (07/18/2022)   Humiliation, Afraid, Rape, and Kick questionnaire    Fear of Current or Ex-Partner: No    Emotionally Abused: No    Physically Abused: No    Sexually Abused: No    Review of Systems  All other systems reviewed and are negative.       Objective    BP 110/70   Pulse 94   Temp 97.6 F (36.4 C) (Oral)   Resp 16   Wt 283 lb 3.2 oz (128.5 kg)   BMI 47.13 kg/m   Physical Exam Vitals and nursing note reviewed.  Constitutional:      General: She is not in acute distress.    Appearance: She is obese.  Cardiovascular:     Rate and Rhythm: Normal rate and regular rhythm.  Pulmonary:     Effort: Pulmonary effort is normal.     Breath sounds: Normal breath sounds.  Abdominal:     Palpations: Abdomen is soft.     Tenderness: There is no abdominal tenderness.  Neurological:     General: No focal deficit present.     Mental Status: She is alert and oriented to person, place, and time.  Psychiatric:        Mood and Affect: Mood normal.        Behavior: Behavior normal.     {Labs (Optional):23779}    Assessment & Plan:   Type 2 diabetes mellitus with other specified complication, with long-term current use of insulin (HCC) -     Microalbumin / creatinine urine ratio -     POCT glycosylated hemoglobin (Hb A1C)  Encounter for long-term (current) use of insulin (HCC)  Diabetes mellitus treated with oral medication (HCC)     Return in about 3 months (around 04/03/2023) for follow up.   [name], MD

## 2023-01-04 LAB — POCT GLYCOSYLATED HEMOGLOBIN (HGB A1C): Hemoglobin A1C: 6.7 % — AB (ref 4.0–5.6)

## 2023-01-18 ENCOUNTER — Other Ambulatory Visit: Payer: Self-pay

## 2023-01-18 ENCOUNTER — Other Ambulatory Visit: Payer: Self-pay | Admitting: Family Medicine

## 2023-01-18 DIAGNOSIS — E1165 Type 2 diabetes mellitus with hyperglycemia: Secondary | ICD-10-CM

## 2023-01-18 MED ORDER — OZEMPIC (0.25 OR 0.5 MG/DOSE) 2 MG/3ML ~~LOC~~ SOPN
0.2500 mg | PEN_INJECTOR | SUBCUTANEOUS | 1 refills | Status: DC
Start: 2023-01-18 — End: 2023-10-11
  Filled 2023-01-18 – 2023-02-02 (×2): qty 3, 28d supply, fill #0
  Filled 2023-04-02 – 2023-04-06 (×3): qty 3, 28d supply, fill #1

## 2023-01-30 ENCOUNTER — Other Ambulatory Visit: Payer: Self-pay

## 2023-02-02 ENCOUNTER — Other Ambulatory Visit: Payer: Self-pay

## 2023-02-02 ENCOUNTER — Other Ambulatory Visit: Payer: Self-pay | Admitting: Family Medicine

## 2023-02-02 DIAGNOSIS — E1165 Type 2 diabetes mellitus with hyperglycemia: Secondary | ICD-10-CM

## 2023-02-05 ENCOUNTER — Other Ambulatory Visit: Payer: Self-pay

## 2023-02-08 ENCOUNTER — Other Ambulatory Visit: Payer: Self-pay

## 2023-02-08 MED ORDER — METFORMIN HCL ER 500 MG PO TB24
500.0000 mg | ORAL_TABLET | Freq: Two times a day (BID) | ORAL | 1 refills | Status: DC
Start: 2023-02-08 — End: 2023-10-29
  Filled 2023-02-08 – 2023-04-06 (×3): qty 180, 90d supply, fill #0
  Filled 2023-08-06: qty 180, 90d supply, fill #1

## 2023-02-20 ENCOUNTER — Other Ambulatory Visit: Payer: Self-pay

## 2023-02-21 ENCOUNTER — Ambulatory Visit: Payer: Medicaid Other | Admitting: Family

## 2023-04-02 ENCOUNTER — Other Ambulatory Visit: Payer: Self-pay

## 2023-04-03 ENCOUNTER — Other Ambulatory Visit: Payer: Self-pay

## 2023-04-05 ENCOUNTER — Ambulatory Visit: Payer: Medicaid Other | Admitting: Family Medicine

## 2023-04-06 ENCOUNTER — Other Ambulatory Visit: Payer: Self-pay

## 2023-04-09 ENCOUNTER — Other Ambulatory Visit: Payer: Self-pay

## 2023-04-18 ENCOUNTER — Other Ambulatory Visit: Payer: Self-pay

## 2023-04-28 ENCOUNTER — Other Ambulatory Visit: Payer: Self-pay

## 2023-04-28 ENCOUNTER — Emergency Department (HOSPITAL_COMMUNITY)
Admission: EM | Admit: 2023-04-28 | Discharge: 2023-04-29 | Disposition: A | Discharge status: Home / Self Care | Payer: Medicaid Other | Attending: Emergency Medicine | Admitting: Emergency Medicine

## 2023-04-28 ENCOUNTER — Encounter (HOSPITAL_COMMUNITY): Payer: Self-pay | Admitting: Emergency Medicine

## 2023-04-28 DIAGNOSIS — Z794 Long term (current) use of insulin: Secondary | ICD-10-CM | POA: Diagnosis not present

## 2023-04-28 DIAGNOSIS — K5900 Constipation, unspecified: Secondary | ICD-10-CM | POA: Insufficient documentation

## 2023-04-28 MED ORDER — ACETAMINOPHEN 500 MG PO TABS
1000.0000 mg | ORAL_TABLET | Freq: Once | ORAL | Status: AC
  Administered 2023-04-29: 1000 mg via ORAL
  Filled 2023-04-28: qty 2

## 2023-04-28 MED ORDER — POLYETHYLENE GLYCOL 3350 17 G PO PACK
17.0000 g | PACK | Freq: Every day | ORAL | Status: DC
  Administered 2023-04-29: 17 g via ORAL
  Filled 2023-04-28: qty 1

## 2023-04-29 ENCOUNTER — Emergency Department (HOSPITAL_COMMUNITY): Payer: Medicaid Other

## 2023-04-29 LAB — POC URINE PREG, ED: Preg Test, Ur: NEGATIVE

## 2023-04-29 NOTE — ED Provider Notes (Signed)
EMERGENCY DEPARTMENT AT [facility] Provider Note   CSN: [id number] Arrival date & time: 04/28/23  2223     History    Melinda Richardson is a 28 y.o. female.  The history is provided by the patient.  Constipation Severity:  Moderate Time since last bowel movement:  1 day Timing:  Constant Progression:  Unchanged Chronicity:  New Context: not narcotics   Stool description:  Hard and small Relieved by:  Nothing Worsened by:  Nothing Ineffective treatments:  None tried Associated symptoms: no fever and no vomiting   Risk factors: no change in medication        Home Medications Prior to Admission medications   Medication Sig Start Date End Date Taking? Authorizing Provider  Accu-Chek Softclix Lancets lancets Use to check blood sugar three times daily. 07/18/22   Hoy Register, MD  Blood Glucose Monitoring Suppl (ACCU-CHEK GUIDE) w/Device KIT Use to check blood sugar three times daily. 07/18/22   Hoy Register, MD  Continuous Blood Gluc Sensor (FREESTYLE LIBRE 2 SENSOR) MISC Utilize one sensor q14 days to monitor blood glucose 03/07/21   Georganna Skeans, MD  glucose blood (ACCU-CHEK GUIDE) test strip Use to check blood sugar three times daily. 07/18/22   Hoy Register, MD  ibuprofen (ADVIL) 600 MG tablet Take 1 tablet (600 mg total) by mouth every 8 (eight) hours as needed for up to 30 doses for fever, headache, mild pain or moderate pain (Inflammation). Take 1 tablet 3 times daily as needed for inflammation of upper airways and/or pain. 09/24/22   Theadora Rama Scales, PA-C  insulin glargine (LANTUS SOLOSTAR) 100 UNIT/ML Solostar Pen Inject 40 Units into the skin daily. 12/25/22   Georganna Skeans, MD  Insulin Pen Needle 32G X 4 MM MISC Use to inject Lantus once daily. 07/18/22   Hoy Register, MD  metFORMIN (GLUCOPHAGE-XR) 500 MG 24 hr tablet Take 1 tablet (500 mg total) by mouth 2 (two) times daily with a meal. 02/08/23   Georganna Skeans, MD  Semaglutide,0.25  or 0.5MG /DOS, (OZEMPIC, 0.25 OR 0.5 MG/DOSE,) 2 MG/3ML SOPN Inject 0.25 mg into the skin once a week. 01/18/23   Hoy Register, MD      Allergies    Patient has no known allergies.    Review of Systems   Review of Systems  Constitutional:  Negative for fever.  HENT:  Negative for facial swelling.   Respiratory:  Negative for wheezing and stridor.   Gastrointestinal:  Positive for constipation. Negative for vomiting.  All other systems reviewed and are negative.   Physical Exam Updated Vital Signs BP 113/72   Pulse 79   Temp 98.7 F (37.1 C)   Ht 5\' 5"  (1.651 m)   Wt 126.1 kg   SpO2 100%   BMI 46.26 kg/m  Physical Exam Vitals and nursing note reviewed.  Constitutional:      General: She is not in acute distress.    Appearance: Normal appearance. She is well-developed.  HENT:     Head: Normocephalic and atraumatic.     Nose: Nose normal.  Eyes:     Pupils: Pupils are equal, round, and reactive to light.  Cardiovascular:     Rate and Rhythm: Normal rate and regular rhythm.     Pulses: Normal pulses.     Heart sounds: Normal heart sounds.  Pulmonary:     Effort: Pulmonary effort is normal. No respiratory distress.     Breath sounds: Normal breath sounds.  Abdominal:  General: Bowel sounds are normal. There is no distension.     Palpations: Abdomen is soft.     Tenderness: There is no abdominal tenderness. There is no guarding or rebound.  Musculoskeletal:        General: Normal range of motion.     Cervical back: Normal range of motion and neck supple.  Skin:    General: Skin is warm and dry.     Capillary Refill: Capillary refill takes less than 2 seconds.     Findings: No erythema or rash.  Neurological:     General: No focal deficit present.     Mental Status: She is alert and oriented to person, place, and time.     Deep Tendon Reflexes: Reflexes normal.  Psychiatric:        Mood and Affect: Mood normal.     ED Results / Procedures / Treatments    Labs (all labs ordered are listed, but only abnormal results are displayed) Labs Reviewed  POC URINE PREG, ED    EKG None  Radiology DG Abdomen Acute W/Chest Result Date: 04/29/2023 CLINICAL DATA:  Constipation EXAM: DG ABDOMEN ACUTE WITH 1 VIEW CHEST COMPARISON:  Radiographs 06/18/2016 FINDINGS: Bibasilar atelectasis or scarring. The lungs are otherwise clear. Normal cardiomediastinal silhouette. No displaced rib fractures. Nonobstructive bowel-gas pattern. No evidence of free intraperitoneal air. Large stool ball in the rectum. IMPRESSION: No acute cardiopulmonary process. No evidence of obstruction.  Large stool ball in the rectum. Electronically Signed   By: Minerva Fester M.D.   On: 04/29/2023 01:08    Procedures Procedures    Medications Ordered in ED Medications  polyethylene glycol (MIRALAX / GLYCOLAX) packet 17 g (17 g Oral Given 04/29/23 0008)  acetaminophen (TYLENOL) tablet 1,000 mg (1,000 mg Oral Given 04/29/23 0008)    ED Course/ Medical Decision Making/ A&P                                 Medical Decision Making Patient with no BM in one day   Amount and/or Complexity of Data Reviewed External Data Reviewed: notes.    Details: Previous notes reviewed  Labs: ordered.    Details: Negative pregnancy test  Radiology: ordered and independent interpretation performed.    Details: Constipation by me on XR  Risk OTC drugs. Risk Details: Well appearing, abdomen is soft.  Will start bowel regimen.  Miralax on capful time times daily x 7 days then once daily thereafter.  Markedly increase water and fiber in your diet. Stable for discharge with close follow up    Final Clinical Impression(s) / ED Diagnoses Final diagnoses:  Constipation, unspecified constipation type   Return for intractable cough, coughing up blood, fevers > 100.4 unrelieved by medication, shortness of breath, intractable vomiting, chest pain, shortness of breath, weakness, numbness, changes in  speech, facial asymmetry, abdominal pain, passing out, Inability to tolerate liquids or food, cough, altered mental status or any concerns. No signs of systemic illness or infection. The patient is nontoxic-appearing on exam and vital signs are within normal limits.  I have reviewed the triage vital signs and the nursing notes. Pertinent labs & imaging results that were available during my care of the patient were reviewed by me and considered in my medical decision making (see chart for details). After history, exam, and medical workup I feel the patient has been appropriately medically screened and is safe for discharge home. Pertinent diagnoses were  discussed with the patient. Patient was given return precautions.  Rx / DC Orders ED Discharge Orders     None         Knoah Nedeau, MD 04/29/23 1093

## 2023-04-29 NOTE — Discharge Instructions (Addendum)
Miralax 1 capful 3 times a dayfor 7 days then one capful daily.  Dulcolax suppository to loosen stool from bottom.  May repeat after 30 minutes.  Increase the amount of water in your diet and increase fiber in the form of green leafy vegetables

## 2023-04-30 LAB — POC URINE PREG, ED: Preg Test, Ur: NEGATIVE

## 2023-05-07 ENCOUNTER — Ambulatory Visit: Payer: Medicaid Other | Admitting: Family Medicine

## 2023-05-11 ENCOUNTER — Other Ambulatory Visit: Payer: Self-pay | Admitting: Family Medicine

## 2023-05-11 ENCOUNTER — Other Ambulatory Visit (HOSPITAL_COMMUNITY): Payer: Self-pay

## 2023-05-11 DIAGNOSIS — E1165 Type 2 diabetes mellitus with hyperglycemia: Secondary | ICD-10-CM

## 2023-05-11 MED ORDER — LANTUS SOLOSTAR 100 UNIT/ML ~~LOC~~ SOPN
40.0000 [IU] | PEN_INJECTOR | Freq: Every day | SUBCUTANEOUS | 2 refills | Status: DC
Start: 2023-05-11 — End: 2023-10-29
  Filled 2023-05-11 – 2023-05-14 (×2): qty 15, 37d supply, fill #0
  Filled 2023-08-06: qty 15, 37d supply, fill #1
  Filled 2023-09-12: qty 15, 37d supply, fill #2

## 2023-05-14 ENCOUNTER — Other Ambulatory Visit (HOSPITAL_COMMUNITY): Payer: Self-pay

## 2023-05-15 ENCOUNTER — Other Ambulatory Visit: Payer: Self-pay

## 2023-05-16 ENCOUNTER — Ambulatory Visit: Payer: Medicaid Other | Admitting: Family Medicine

## 2023-05-16 ENCOUNTER — Telehealth: Payer: Self-pay | Admitting: Family Medicine

## 2023-05-16 NOTE — Telephone Encounter (Signed)
 Called pt and left vm to call office back to reschedule missed appt

## 2023-08-07 ENCOUNTER — Other Ambulatory Visit: Payer: Self-pay

## 2023-08-28 ENCOUNTER — Ambulatory Visit: Admitting: Family Medicine

## 2023-09-01 ENCOUNTER — Ambulatory Visit: Admission: EM | Admit: 2023-09-01 | Discharge: 2023-09-01 | Disposition: A | Discharge status: Home / Self Care

## 2023-09-01 ENCOUNTER — Ambulatory Visit (INDEPENDENT_AMBULATORY_CARE_PROVIDER_SITE_OTHER)

## 2023-09-01 DIAGNOSIS — R0789 Other chest pain: Secondary | ICD-10-CM

## 2023-09-01 MED ORDER — PREDNISONE 20 MG PO TABS: 40.0000 mg | ORAL_TABLET | Freq: Every day | ORAL | 0 refills | Status: AC

## 2023-09-01 MED ORDER — CYCLOBENZAPRINE HCL 5 MG PO TABS: 5.0000 mg | ORAL_TABLET | Freq: Three times a day (TID) | ORAL | 0 refills | Status: DC | PRN

## 2023-09-01 NOTE — ED Provider Notes (Signed)
 [facility]    CSN: [id number] Arrival date & time: 09/01/23  1328      History   Chief Complaint Chief Complaint  Patient presents with   Chest Discomfort    HPI Melinda Richardson is a 28 y.o. female.   28 year old female who presents urgent care with complaints of pain in the chest wall right under the right breast.  She reports that this started last night.  It was a sharp pain.  The pain does get worse when she bends over or tries to pick something up.  She reports that the pain also gets worse when she is breathing in.  She has had this previously and the last time it lasted several days.  In the past she has been told to take Tylenol  for it.  Friday she was doing a lot of dancing and bending over.  She denies any fevers, chills, abdominal pain, dysuria, hematuria, nausea, vomiting, diarrhea.  She has not had any recent illnesses although she has had a mild cough at times.       Past Medical History:  Diagnosis Date   Anemia    Anxiety    Arthritis    Phreesia 06/20/2020   Diabetes mellitus without complication (HCC)    Phreesia 06/20/2020   Preterm labor    Preterm premature rupture of membranes (PPROM) with unknown onset of labor 12/11/2014    Patient Active Problem List   Diagnosis Date Noted   Trichomonas infection 07/06/2022   Nexplanon  in place 07/04/2022   Class 3 severe obesity without serious comorbidity with body mass index (BMI) of 50.0 to 59.9 in adult 05/20/2019   History of cesarean delivery 04/05/2017    Past Surgical History:  Procedure Laterality Date   CESAREAN SECTION N/A 12/17/2014   Procedure: CESAREAN SECTION;  Surgeon: Albino Hum, MD;  Location: [facility];  Service: Obstetrics;  Laterality: N/A;   CESAREAN SECTION N/A 04/05/2017   Procedure: CESAREAN SECTION;  Surgeon: Malka Sea, DO;  Location: [facility];  Service: Obstetrics;  Laterality: N/A;   CESAREAN SECTION N/A 08/30/2018   Procedure: CESAREAN SECTION;   Surgeon: Wendelyn Halter, MD;  Location: [facility] ORS;  Service: Obstetrics;  Laterality: N/A;   CESAREAN SECTION N/A    Phreesia 06/20/2020   TONSILLECTOMY      OB History     Gravida  3   Para  3   Term  1   Preterm  2   AB  0   Living  2      SAB  0   IAB  0   Ectopic  0   Multiple  0   Live Births  3            Home Medications    Prior to Admission medications   Medication Sig Start Date End Date Taking? Authorizing Provider  cyclobenzaprine (FLEXERIL) 5 MG tablet Take 1 tablet (5 mg total) by mouth every 8 (eight) hours as needed for muscle spasms. 09/01/23  Yes Syncere Eble A, PA-C  etonogestrel  (NEXPLANON ) 68 MG IMPL implant 1 each by Subdermal route once. Left Arm   Yes [provider]  insulin  glargine (LANTUS  SOLOSTAR) 100 UNIT/ML Solostar Pen Inject 40 Units into the skin daily. 05/11/23  Yes Abraham Abo, MD  predniSONE  (DELTASONE ) 20 MG tablet Take 2 tablets (40 mg total) by mouth daily with breakfast for 5 days. 09/01/23 09/06/23 Yes Nastashia Gallo A, PA-C  Accu-Chek Softclix  Lancets lancets Use to check blood sugar three times daily. 07/18/22   Newlin, Enobong, MD  Blood Glucose Monitoring Suppl (ACCU-CHEK GUIDE) w/Device KIT Use to check blood sugar three times daily. 07/18/22   Newlin, Enobong, MD  Continuous Blood Gluc Sensor (FREESTYLE LIBRE 2 SENSOR) MISC Utilize one sensor q14 days to monitor blood glucose 03/07/21   Abraham Abo, MD  glucose blood (ACCU-CHEK GUIDE) test strip Use to check blood sugar three times daily. 07/18/22   Newlin, Enobong, MD  ibuprofen  (ADVIL ) 600 MG tablet Take 1 tablet (600 mg total) by mouth every 8 (eight) hours as needed for up to 30 doses for fever, headache, mild pain or moderate pain (Inflammation). Take 1 tablet 3 times daily as needed for inflammation of upper airways and/or pain. 09/24/22   Eloise Hake Scales, PA-C  Insulin  Pen Needle 32G X 4 MM MISC Use to inject Lantus  once daily. 07/18/22   Newlin,  Enobong, MD  metFORMIN  (GLUCOPHAGE -XR) 500 MG 24 hr tablet Take 1 tablet (500 mg total) by mouth 2 (two) times daily with a meal. 02/08/23   Abraham Abo, MD  Semaglutide ,0.25 or 0.5MG /DOS, (OZEMPIC , 0.25 OR 0.5 MG/DOSE,) 2 MG/3ML SOPN Inject 0.25 mg into the skin once a week. 01/18/23   Joaquin Mulberry, MD    Family History Family History  Problem Relation Age of Onset   Kidney failure Father    Diabetes Mother     Social History Social History   Tobacco Use   Smoking status: Some Days    Current packs/day: 0.25    Types: Cigarettes   Smokeless tobacco: Never  Vaping Use   Vaping status: Never Used  Substance Use Topics   Alcohol use: Not Currently   Drug use: Yes    Types: Marijuana     Allergies   Patient has no known allergies.   Review of Systems Review of Systems  Constitutional:  Negative for chills and fever.  HENT:  Negative for ear pain and sore throat.   Eyes:  Negative for pain and visual disturbance.  Respiratory:  Negative for cough and shortness of breath.   Cardiovascular:  Positive for chest pain (Right side under the breast). Negative for palpitations.  Gastrointestinal:  Negative for abdominal pain and vomiting.  Genitourinary:  Negative for dysuria and hematuria.  Musculoskeletal:  Negative for arthralgias and back pain.  Skin:  Negative for color change and rash.  Neurological:  Negative for seizures and syncope.  All other systems reviewed and are negative.    Physical Exam Triage Vital Signs ED Triage Vitals  Encounter Vitals Group     BP 09/01/23 1359 128/82     Systolic BP Percentile --      Diastolic BP Percentile --      Pulse Rate 09/01/23 1359 90     Resp 09/01/23 1359 20     Temp 09/01/23 1359 98.2 F (36.8 C)     Temp Source 09/01/23 1359 Oral     SpO2 09/01/23 1359 99 %     Weight 09/01/23 1355 290 lb (131.5 kg)     Height 09/01/23 1355 5\' 5"  (1.651 m)     Head Circumference --      Peak Flow --      Pain Score  09/01/23 1352 8     Pain Loc --      Pain Education --      Exclude from Growth Chart --    No data found.  Updated Vital Signs  BP 128/82 (BP Location: Left Arm)   Pulse 90   Temp 98.2 F (36.8 C) (Oral)   Resp 20   Ht 5\' 5"  (1.651 m)   Wt 290 lb (131.5 kg)   LMP  (LMP Unknown)   SpO2 99%   BMI 48.26 kg/m   Visual Acuity Right Eye Distance:   Left Eye Distance:   Bilateral Distance:    Right Eye Near:   Left Eye Near:    Bilateral Near:     Physical Exam Vitals and nursing note reviewed.  Constitutional:      General: She is not in acute distress.    Appearance: She is well-developed.  HENT:     Head: Normocephalic and atraumatic.     Right Ear: Tympanic membrane normal.     Left Ear: Tympanic membrane normal.     Nose: Nose normal.     Mouth/Throat:     Mouth: Mucous membranes are moist.  Eyes:     Conjunctiva/sclera: Conjunctivae normal.  Cardiovascular:     Rate and Rhythm: Normal rate and regular rhythm.     Heart sounds: No murmur heard. Pulmonary:     Effort: Pulmonary effort is normal. No respiratory distress.     Breath sounds: Normal breath sounds.  Chest:     Chest wall: Tenderness (Tender area under the right breast) present.     Comments: Chest pain is reproducible with certain positions as well as with palpitation Abdominal:     General: Bowel sounds are normal.     Palpations: Abdomen is soft.     Tenderness: There is no abdominal tenderness. There is no guarding or rebound.  Musculoskeletal:        General: No swelling.     Cervical back: Neck supple.  Skin:    General: Skin is warm and dry.     Capillary Refill: Capillary refill takes less than 2 seconds.  Neurological:     General: No focal deficit present.     Mental Status: She is alert and oriented to person, place, and time.  Psychiatric:        Mood and Affect: Mood normal.      UC Treatments / Results  Labs (all labs ordered are listed, but only abnormal results are  displayed) Labs Reviewed - No data to display  EKG   Radiology DG Ribs Unilateral W/Chest Right Result Date: 09/01/2023 CLINICAL DATA:  chest pain right sided EXAM: RIGHT RIBS AND CHEST - 3+ VIEW COMPARISON:  04/29/2023 FINDINGS: No fracture or other bone lesions are seen involving the ribs. There is no evidence of pneumothorax or pleural effusion. Both lungs are clear. Heart size and mediastinal contours are within normal limits. IMPRESSION: Negative. Electronically Signed   By: Nicoletta Barrier M.D.   On: 09/01/2023 15:42    Procedures Procedures (including critical care time)  Medications Ordered in UC Medications - No data to display  Initial Impression / Assessment and Plan / UC Course  I have reviewed the triage vital signs and the nursing notes.  Pertinent labs & imaging results that were available during my care of the patient were reviewed by me and considered in my medical decision making (see chart for details).     Right-sided chest wall pain - Plan: DG Ribs Unilateral W/Chest Right, DG Ribs Unilateral W/Chest Right  Chest pain, musculoskeletal  EKG done today shows normal sinus rhythm.  Chest x-ray is negative for any bony abnormalities or abnormalities in the lung.  We cannot 100% rule out a cardiac source however this is a very low likelihood given the symptoms do worsen with movement and are reproducible with palpitation which is more suggestive of a muscular issue.  We will treat with the following: Prednisone  40 mg (2 tablets) once daily for 5 days. Take this in the morning.  This is a steroid to help with inflammation and pain. Flexeril 5 mg every 8 hours as needed for muscle spasms.  Use caution as this medication can cause drowsiness. Light stretching to help with the discomfort. May alternate heat and ice on the area to help with symptoms. If symptoms worsen may need to consider going to the emergency room for further evaluation Can return to urgent care as  needed.   Final Clinical Impressions(s) / UC Diagnoses   Final diagnoses:  Right-sided chest wall pain  Chest pain, musculoskeletal     Discharge Instructions      EKG done today shows normal sinus rhythm.  Chest x-ray is negative for any bony abnormalities or abnormalities in the lung.  We cannot 100% rule out a cardiac source however this is a very low likelihood given the symptoms do worsen with movement and are reproducible with palpitation which is more suggestive of a muscular issue.  We will treat with the following: Prednisone  40 mg (2 tablets) once daily for 5 days. Take this in the morning.  This is a steroid to help with inflammation and pain. Flexeril 5 mg every 8 hours as needed for muscle spasms.  Use caution as this medication can cause drowsiness. Light stretching to help with the discomfort. May alternate heat and ice on the area to help with symptoms. If symptoms worsen may need to consider going to the emergency room for further evaluation Can return to urgent care as needed.   ED Prescriptions     Medication Sig Dispense Auth. Provider   predniSONE  (DELTASONE ) 20 MG tablet Take 2 tablets (40 mg total) by mouth daily with breakfast for 5 days. 10 tablet Raseel Jans A, PA-C   cyclobenzaprine (FLEXERIL) 5 MG tablet Take 1 tablet (5 mg total) by mouth every 8 (eight) hours as needed for muscle spasms. 30 tablet Kreg Pesa, [place]      PDMP not reviewed this encounter.   Kreg Pesa, PA-C 09/01/23 1549

## 2023-09-01 NOTE — ED Triage Notes (Signed)
 Last Glucose: 130 (this morning fasting just before insulin  dose)

## 2023-09-01 NOTE — Discharge Instructions (Addendum)
 EKG done today shows normal sinus rhythm.  Chest x-ray is negative for any bony abnormalities or abnormalities in the lung.  We cannot 100% rule out a cardiac source however this is a very low likelihood given the symptoms do worsen with movement and are reproducible with palpitation which is more suggestive of a muscular issue.  We will treat with the following: Prednisone  40 mg (2 tablets) once daily for 5 days. Take this in the morning.  This is a steroid to help with inflammation and pain. Flexeril 5 mg every 8 hours as needed for muscle spasms.  Use caution as this medication can cause drowsiness. Light stretching to help with the discomfort. May alternate heat and ice on the area to help with symptoms. If symptoms worsen may need to consider going to the emergency room for further evaluation Can return to urgent care as needed.

## 2023-09-01 NOTE — ED Triage Notes (Signed)
"  I have been having Chest Pain's since last night". This morning when passing gas helped some. "The pain is mainly when inhaling". ? Sob when the pain is present and bending. No injury.

## 2023-09-12 ENCOUNTER — Other Ambulatory Visit: Payer: Self-pay | Admitting: Family Medicine

## 2023-09-12 ENCOUNTER — Other Ambulatory Visit: Payer: Self-pay

## 2023-09-12 DIAGNOSIS — E1165 Type 2 diabetes mellitus with hyperglycemia: Secondary | ICD-10-CM

## 2023-09-27 ENCOUNTER — Other Ambulatory Visit (HOSPITAL_COMMUNITY): Payer: Self-pay

## 2023-09-27 ENCOUNTER — Ambulatory Visit: Admitting: Family Medicine

## 2023-10-11 ENCOUNTER — Encounter (HOSPITAL_COMMUNITY): Payer: Self-pay | Admitting: Emergency Medicine

## 2023-10-11 ENCOUNTER — Ambulatory Visit: Payer: Self-pay

## 2023-10-11 ENCOUNTER — Other Ambulatory Visit: Payer: Self-pay

## 2023-10-11 ENCOUNTER — Telehealth (HOSPITAL_COMMUNITY): Payer: Self-pay

## 2023-10-11 ENCOUNTER — Ambulatory Visit (HOSPITAL_COMMUNITY)
Admission: EM | Admit: 2023-10-11 | Discharge: 2023-10-11 | Disposition: A | Discharge status: Home / Self Care | Attending: Family Medicine | Admitting: Family Medicine

## 2023-10-11 DIAGNOSIS — M25532 Pain in left wrist: Secondary | ICD-10-CM

## 2023-10-11 MED ORDER — PREDNISONE 20 MG PO TABS: 40.0000 mg | ORAL_TABLET | Freq: Every day | ORAL | 0 refills | Status: AC

## 2023-10-11 MED ORDER — PREDNISONE 20 MG PO TABS: 40.0000 mg | ORAL_TABLET | Freq: Every day | ORAL | 0 refills | Status: DC

## 2023-10-11 NOTE — Telephone Encounter (Signed)
 FYI Only or Action Required?: FYI only for provider.  Patient was last seen in primary care on 01/02/2023 by Melinda Bleacher, MD. Called Nurse Triage reporting Tingling. Symptoms began a week ago. Interventions attempted: Rest, hydration, or home remedies. Symptoms are: gradually worsening.  Triage Disposition: See HCP Within 4 Hours (Or PCP Triage)  Patient/caregiver understands and will follow disposition?:   Copied from CRM [id number]. Topic: Clinical - Red Word Triage >> Oct 11, 2023 11:07 AM Treva T wrote: [facility] that prompted transfer to Nurse Triage: Patient calling states she is having left hand numbness, and tingling which is worsening, especially when sleeping. Reason for Disposition  [1] Tingling (e.g., pins and needles) of the face, arm / hand, or leg / foot on one side of the body AND [2] present now (Exceptions: Chronic or recurrent symptom lasting > 4 weeks; or tingling from known cause, such as: bumped elbow, carpal tunnel syndrome, pinched nerve, frostbite.)  Answer Assessment - Initial Assessment Questions 1. SYMPTOM: What is the main symptom you are concerned about? (e.g., weakness, numbness)     Tingling in left hand 2. ONSET: When did this start? (minutes, hours, days; while sleeping)     One week ago  3. LAST NORMAL: When was the last time you (the patient) were normal (no symptoms)?     One week ago 4. PATTERN Does this come and go, or has it been constant since it started?  Is it present now?     constant 5. CARDIAC SYMPTOMS: Have you had any of the following symptoms: chest pain, difficulty breathing, palpitations?     Denies 6. NEUROLOGIC SYMPTOMS: Have you had any of the following symptoms: headache, dizziness, vision loss, double vision, changes in speech, unsteady on your feet?     Denies  7. OTHER SYMPTOMS: Do you have any other symptoms?     Denies  Additional info:  1) tingling worse at night. 2) No appointments in office patient advised to  proceed to urgent care  Protocols used: Neurologic [facility]

## 2023-10-11 NOTE — ED Provider Notes (Signed)
 [facility]   [id number] 10/11/23 Arrival Time: 1642  ASSESSMENT & PLAN:  1. Left wrist pain    Trial of: New Prescriptions   PREDNISONE  (DELTASONE ) 20 MG TABLET    Take 2 tablets (40 mg total) by mouth daily.    Orders Placed This Encounter  Procedures   Apply Wrist brace  Wear for next week; occas remove to work on ROM. Work/school excuse note: declined.  Recommend:  Follow-up Information     Schedule an appointment as soon as possible for a visit  with [facility].   Contact information: [address] [name]   [address] [phone]                 Discharge Instructions      Be aware, the medicine prescribed will cause your blood sugars to rise temporarily; monitor closely.      Reviewed expectations re: course of current medical issues. Questions answered. Outlined signs and symptoms indicating need for more acute intervention. Patient verbalized understanding. After Visit Summary given.  SUBJECTIVE: History from: patient. Melinda Richardson is a 28 y.o. female who reports L wrist pain; gradual onset; x 1 week; works housekeeping; with intermittent numbness/tingling that is worse after working and better with rest. Tylenol  with mild help. Denies wrist trauma.  Past Surgical History:  Procedure Laterality Date   CESAREAN SECTION N/A 12/17/2014   Procedure: CESAREAN SECTION;  Surgeon: Norleen Edsel GAILS, MD;  Location: [facility];  Service: Obstetrics;  Laterality: N/A;   CESAREAN SECTION N/A 04/05/2017   Procedure: CESAREAN SECTION;  Surgeon: Barbra Lang PARAS, DO;  Location: [facility];  Service: Obstetrics;  Laterality: N/A;   CESAREAN SECTION N/A 08/30/2018   Procedure: CESAREAN SECTION;  Surgeon: Jayne Vonn DEL, MD;  Location: [facility];  Service: Obstetrics;  Laterality: N/A;   CESAREAN SECTION N/A    Phreesia 06/20/2020   TONSILLECTOMY        OBJECTIVE:  Vitals:   10/11/23 1802   BP: 116/73  Pulse: 82  Resp: 18  Temp: 98.2 F (36.8 C)  TempSrc: Oral  SpO2: 96%    General appearance: alert; no distress HEENT: [place]; AT Neck: supple with FROM Resp: unlabored respirations Extremities: LUE: warm with well perfused appearance; without localized TTP; without gross deformities; swelling: none; bruising: none; wrist ROM: normal CV: brisk extremity capillary refill of LUE; 2+ radial pulse of LUE. Skin: warm and dry; no visible rashes Neurologic: gait normal; normal sensation and strength of LUE Psychological: alert and cooperative; normal mood and affect  Imaging: No results found.    No Known Allergies  Past Medical History:  Diagnosis Date   Anemia    Anxiety    Arthritis    Phreesia 06/20/2020   Diabetes mellitus without complication (HCC)    Phreesia 06/20/2020   Preterm labor    Preterm premature rupture of membranes (PPROM) with unknown onset of labor 12/11/2014   Social History   Socioeconomic History   Marital status: Single    Spouse name: Not on file   Number of children: Not on file   Years of education: Not on file   Highest education level: Not on file  Occupational History   Not on file  Tobacco Use   Smoking status: Some Days    Current packs/day: 0.25    Types: Cigarettes   Smokeless tobacco: Never  Vaping Use   Vaping status: Never Used  Substance and Sexual Activity  Alcohol use: Not Currently   Drug use: Yes    Types: Marijuana   Sexual activity: Yes    Birth control/protection: Implant  Other Topics Concern   Not on file  Social History Narrative   ** Merged History Encounter **       Social Drivers of Health   Financial Resource Strain: Low Risk  (07/18/2022)   Overall Financial Resource Strain (CARDIA)    Difficulty of Paying Living Expenses: Not hard at all  Food Insecurity: No Food Insecurity (07/18/2022)   Hunger Vital Sign    Worried About Running Out of Food in the Last Year: Never true    Ran Out of Food  in the Last Year: Never true  Transportation Needs: No Transportation Needs (07/04/2022)   PRAPARE - Administrator, Civil Service (Medical): No    Lack of Transportation (Non-Medical): No  Physical Activity: Inactive (07/18/2022)   Exercise Vital Sign    Days of Exercise per Week: 0 days    Minutes of Exercise per Session: 0 min  Stress: No Stress Concern Present (07/18/2022)   [employer] of Occupational Health - Occupational Stress Questionnaire    Feeling of Stress : Not at all  Social Connections: Not on file   Family History  Problem Relation Age of Onset   Kidney failure Father    Diabetes Mother    Past Surgical History:  Procedure Laterality Date   CESAREAN SECTION N/A 12/17/2014   Procedure: CESAREAN SECTION;  Surgeon: Norleen Edsel GAILS, MD;  Location: [facility];  Service: Obstetrics;  Laterality: N/A;   CESAREAN SECTION N/A 04/05/2017   Procedure: CESAREAN SECTION;  Surgeon: Barbra Lang PARAS, DO;  Location: [facility];  Service: Obstetrics;  Laterality: N/A;   CESAREAN SECTION N/A 08/30/2018   Procedure: CESAREAN SECTION;  Surgeon: Jayne Vonn DEL, MD;  Location: [facility];  Service: Obstetrics;  Laterality: N/A;   CESAREAN SECTION N/A    Phreesia 06/20/2020   TONSILLECTOMY         Rolinda Rogue, MD 10/11/23 [phone]

## 2023-10-11 NOTE — ED Triage Notes (Signed)
 Left hand, finger tips are numb.  This has been present for a week.  Since one week ago, this numb sensation has remained.  Denies pain, no fall.  Patient is left handed.    Patient has been taking tylenol .    This morning, cbg was 115.

## 2023-10-11 NOTE — Telephone Encounter (Signed)
 Medication sent to [facility]

## 2023-10-11 NOTE — Discharge Instructions (Signed)
 Be aware, the medicine prescribed will cause your blood sugars to rise temporarily; monitor closely.

## 2023-10-24 ENCOUNTER — Encounter: Payer: Self-pay | Admitting: Family

## 2023-10-24 ENCOUNTER — Ambulatory Visit (INDEPENDENT_AMBULATORY_CARE_PROVIDER_SITE_OTHER): Admitting: Family

## 2023-10-24 ENCOUNTER — Encounter: Payer: Self-pay | Admitting: Family Medicine

## 2023-10-24 VITALS — BP 122/73 | HR 89 | Temp 98.7°F | Resp 18 | Ht 63.0 in | Wt 288.6 lb

## 2023-10-24 DIAGNOSIS — M25532 Pain in left wrist: Secondary | ICD-10-CM | POA: Diagnosis not present

## 2023-10-24 MED ORDER — GABAPENTIN 300 MG PO CAPS
300.0000 mg | ORAL_CAPSULE | Freq: Every day | ORAL | 0 refills | Status: DC
Start: 2023-10-24 — End: 2024-01-22

## 2023-10-24 NOTE — Progress Notes (Signed)
 Patient left hand has been numb for more than two weeks now

## 2023-10-24 NOTE — Progress Notes (Signed)
 Patient ID: Melinda Richardson, female    DOB: 11-25-1995  MRN: [id number]  CC: Urgent Care Follow-Up  Subjective: Melinda Richardson is a 28 y.o. female who presents for Urgent Care follow-up.   Her concerns today include:  Patient was seen on 10/11/2023 at [facility] for left wrist pain. States left wrist pain and left middle finger numbness persisting. Denies red flag symptoms. Reports wrist brace helps some. States she doesn't feel Prednisone  is helping.   Patient Active Problem List   Diagnosis Date Noted   Trichomonas infection 07/06/2022   Nexplanon  in place 07/04/2022   Class 3 severe obesity without serious comorbidity with body mass index (BMI) of 50.0 to 59.9 in adult 05/20/2019   History of cesarean delivery 04/05/2017     Current Outpatient Medications on File Prior to Visit  Medication Sig Dispense Refill   Accu-Chek Softclix Lancets lancets Use to check blood sugar three times daily. 100 each 6   Blood Glucose Monitoring Suppl (ACCU-CHEK GUIDE) w/Device KIT Use to check blood sugar three times daily. 1 kit 0   Continuous Blood Gluc Sensor (FREESTYLE LIBRE 2 SENSOR) MISC Utilize one sensor q14 days to monitor blood glucose 2 each 5   etonogestrel  (NEXPLANON ) 68 MG IMPL implant 1 each by Subdermal route once. Left Arm     glucose blood (ACCU-CHEK GUIDE) test strip Use to check blood sugar three times daily. 100 each 6   ibuprofen  (ADVIL ) 600 MG tablet Take 1 tablet (600 mg total) by mouth every 8 (eight) hours as needed for up to 30 doses for fever, headache, mild pain or moderate pain (Inflammation). Take 1 tablet 3 times daily as needed for inflammation of upper airways and/or pain. 30 tablet 0   insulin  glargine (LANTUS  SOLOSTAR) 100 UNIT/ML Solostar Pen Inject 40 Units into the skin daily. 15 mL 2   Insulin  Pen Needle 32G X 4 MM MISC Use to inject Lantus  once daily. 100 each 6   metFORMIN  (GLUCOPHAGE -XR) 500 MG 24 hr tablet Take 1 tablet (500 mg  total) by mouth 2 (two) times daily with a meal. 180 tablet 1   predniSONE  (DELTASONE ) 20 MG tablet Take 2 tablets (40 mg total) by mouth daily. 10 tablet 0   No current facility-administered medications on file prior to visit.    No Known Allergies  Social History   Socioeconomic History   Marital status: Single    Spouse name: Not on file   Number of children: Not on file   Years of education: Not on file   Highest education level: Not on file  Occupational History   Not on file  Tobacco Use   Smoking status: Some Days    Current packs/day: 0.25    Types: Cigarettes   Smokeless tobacco: Never  Vaping Use   Vaping status: Never Used  Substance and Sexual Activity   Alcohol use: Not Currently   Drug use: Yes    Types: Marijuana   Sexual activity: Yes    Birth control/protection: Implant  Other Topics Concern   Not on file  Social History Narrative   ** Merged History Encounter **       Social Drivers of Health   Financial Resource Strain: Low Risk  (07/18/2022)   Overall Financial Resource Strain (CARDIA)    Difficulty of Paying Living Expenses: Not hard at all  Food Insecurity: No Food Insecurity (07/18/2022)   Hunger Vital Sign    Worried About Running  Out of Food in the Last Year: Never true    Ran Out of Food in the Last Year: Never true  Transportation Needs: No Transportation Needs (07/04/2022)   PRAPARE - Administrator, Civil Service (Medical): No    Lack of Transportation (Non-Medical): No  Physical Activity: Inactive (07/18/2022)   Exercise Vital Sign    Days of Exercise per Week: 0 days    Minutes of Exercise per Session: 0 min  Stress: No Stress Concern Present (07/18/2022)   [employer] of Occupational Health - Occupational Stress Questionnaire    Feeling of Stress : Not at all  Social Connections: Socially Isolated (10/24/2023)   Social Connection and Isolation Panel    Frequency of Communication with Friends and Family: Three times a  week    Frequency of Social Gatherings with Friends and Family: Three times a week    Attends Religious Services: Never    Active Member of Clubs or Organizations: No    Attends Banker Meetings: Never    Marital Status: Never married  Intimate Partner Violence: Not At Risk (07/18/2022)   Humiliation, Afraid, Rape, and Kick questionnaire    Fear of Current or Ex-Partner: No    Emotionally Abused: No    Physically Abused: No    Sexually Abused: No    Family History  Problem Relation Age of Onset   Kidney failure Father    Diabetes Mother     Past Surgical History:  Procedure Laterality Date   CESAREAN SECTION N/A 12/17/2014   Procedure: CESAREAN SECTION;  Surgeon: Norleen Edsel GAILS, MD;  Location: [facility];  Service: Obstetrics;  Laterality: N/A;   CESAREAN SECTION N/A 04/05/2017   Procedure: CESAREAN SECTION;  Surgeon: Barbra Lang PARAS, DO;  Location: [facility];  Service: Obstetrics;  Laterality: N/A;   CESAREAN SECTION N/A 08/30/2018   Procedure: CESAREAN SECTION;  Surgeon: Jayne Vonn DEL, MD;  Location: [facility];  Service: Obstetrics;  Laterality: N/A;   CESAREAN SECTION N/A    Phreesia 06/20/2020   TONSILLECTOMY      ROS: Review of Systems Negative except as stated above  PHYSICAL EXAM: BP 122/73   Pulse 89   Temp 98.7 F (37.1 C) (Oral)   Resp 18   Ht 5' 3 (1.6 m)   Wt 288 lb 9.6 oz (130.9 kg)   LMP 10/01/2023 (Exact Date)   SpO2 95%   BMI 51.12 kg/m   Physical Exam HENT:     Head: Normocephalic and atraumatic.     Nose: Nose normal.     Mouth/Throat:     Mouth: Mucous membranes are moist.     Pharynx: Oropharynx is clear.  Eyes:     Extraocular Movements: Extraocular movements intact.     Conjunctiva/sclera: Conjunctivae normal.     Pupils: Pupils are equal, round, and reactive to light.  Cardiovascular:     Rate and Rhythm: Normal rate and regular rhythm.     Pulses: Normal pulses.     Heart sounds: Normal heart sounds.  Pulmonary:      Effort: Pulmonary effort is normal.     Breath sounds: Normal breath sounds.  Musculoskeletal:        General: Normal range of motion.     Right shoulder: Normal.     Left shoulder: Normal.     Right upper arm: Normal.     Left upper arm: Normal.     Right elbow: Normal.  Left elbow: Normal.     Right forearm: Normal.     Left forearm: Normal.     Right wrist: Normal.     Left wrist: Normal.     Right hand: Normal.     Left hand: Normal.     Cervical back: Normal range of motion and neck supple.     Comments: Left wrist brace.  Neurological:     General: No focal deficit present.     Mental Status: She is alert and oriented to person, place, and time.  Psychiatric:        Mood and Affect: Mood normal.        Behavior: Behavior normal.    ASSESSMENT AND PLAN: 1. Left wrist pain (Primary) - Gabapentin  as prescribed. Counseled on medication adherence/adverse effects.  - Follow-up with primary provider in 4 weeks or sooner if needed. - gabapentin  (NEURONTIN ) 300 MG capsule; Take 1 capsule (300 mg total) by mouth at bedtime.  Dispense: 90 capsule; Refill: 0  Patient was given the opportunity to ask questions.  Patient verbalized understanding of the plan and was able to repeat key elements of the plan. Patient was given clear instructions to go to Emergency Department or return to medical center if symptoms don't improve, worsen, or new problems develop.The patient verbalized understanding.   Requested Prescriptions   Signed Prescriptions Disp Refills   gabapentin  (NEURONTIN ) 300 MG capsule 90 capsule 0    Sig: Take 1 capsule (300 mg total) by mouth at bedtime.    Return for Follow-Up or next available with Raguel Blush, MD.  Greig JINNY Drones, NP

## 2023-10-29 ENCOUNTER — Other Ambulatory Visit: Payer: Self-pay | Admitting: Family Medicine

## 2023-10-29 DIAGNOSIS — E1165 Type 2 diabetes mellitus with hyperglycemia: Secondary | ICD-10-CM

## 2023-10-30 ENCOUNTER — Other Ambulatory Visit: Payer: Self-pay

## 2023-10-30 ENCOUNTER — Other Ambulatory Visit (HOSPITAL_COMMUNITY): Payer: Self-pay

## 2023-10-30 MED ORDER — METFORMIN HCL ER 500 MG PO TB24
500.0000 mg | ORAL_TABLET | Freq: Two times a day (BID) | ORAL | 1 refills | Status: AC
Start: 2023-10-30 — End: ?
  Filled 2023-10-30: qty 180, 90d supply, fill #0

## 2023-10-30 MED ORDER — LANTUS SOLOSTAR 100 UNIT/ML ~~LOC~~ SOPN
40.0000 [IU] | PEN_INJECTOR | Freq: Every day | SUBCUTANEOUS | 2 refills | Status: AC
Start: 2023-10-30 — End: ?
  Filled 2023-10-30: qty 15, 37d supply, fill #0

## 2023-12-03 ENCOUNTER — Encounter: Admitting: Family Medicine

## 2023-12-07 ENCOUNTER — Ambulatory Visit
Admission: EM | Admit: 2023-12-07 | Discharge: 2023-12-07 | Disposition: A | Discharge status: Home / Self Care | Attending: Nurse Practitioner | Admitting: Nurse Practitioner

## 2023-12-07 ENCOUNTER — Ambulatory Visit (INDEPENDENT_AMBULATORY_CARE_PROVIDER_SITE_OTHER)

## 2023-12-07 DIAGNOSIS — M25461 Effusion, right knee: Secondary | ICD-10-CM

## 2023-12-07 DIAGNOSIS — M25561 Pain in right knee: Secondary | ICD-10-CM

## 2023-12-07 MED ORDER — IBUPROFEN 800 MG PO TABS: 800.0000 mg | ORAL_TABLET | Freq: Three times a day (TID) | ORAL | 0 refills | Status: DC | PRN

## 2023-12-07 NOTE — ED Provider Notes (Signed)
 [facility]    CSN: [id number] Arrival date & time: 12/07/23  0934      History   Chief Complaint Chief Complaint  Patient presents with   Knee Pain    HPI Melinda Richardson is a 28 y.o. female.   Discussed the use of AI scribe software for clinical note transcription with the patient, who gave verbal consent to proceed.   The patient presents with right knee pain that has been bothering her for a couple of weeks. 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  The patient denies any specific injury, falls, or unusual activities that may have triggered the knee pain. She has experienced issues with her left knee in the past, but currently, the left knee is not causing any discomfort. The right knee pain is described as causing stiffness, particularly when walking. The patient has attempted to manage the pain with Tylenol  and massage, which may have irritated the knee slightly. The patient denies any numbness, tingling, or weakness in their legs. She reports no swelling but mention that the knee feels stiff at times. The pain is described as real bad when turning in certain ways.   The following portions of the patient's history were reviewed and updated as appropriate: allergies, current medications, past family history, past medical history, past social history, past surgical history, and problem list.      Past Medical History:  Diagnosis Date   Anemia    Anxiety    Arthritis    Phreesia 06/20/2020   Diabetes mellitus without complication (HCC)    Phreesia 06/20/2020   Preterm labor    Preterm premature rupture of membranes (PPROM) with unknown onset of labor 12/11/2014    Patient Active Problem List   Diagnosis Date Noted   Trichomonas infection 07/06/2022   Nexplanon  in place 07/04/2022   Class 3 severe obesity without serious comorbidity with body mass index  (BMI) of 50.0 to 59.9 in adult 05/20/2019   History of cesarean delivery 04/05/2017    Past Surgical History:  Procedure Laterality Date   CESAREAN SECTION N/A 12/17/2014   Procedure: CESAREAN SECTION;  Surgeon: Norleen Edsel GAILS, MD;  Location: [facility];  Service: Obstetrics;  Laterality: N/A;   CESAREAN SECTION N/A 04/05/2017   Procedure: CESAREAN SECTION;  Surgeon: Barbra Lang PARAS, DO;  Location: [facility];  Service: Obstetrics;  Laterality: N/A;   CESAREAN SECTION N/A 08/30/2018   Procedure: CESAREAN SECTION;  Surgeon: Jayne Vonn DEL, MD;  Location: [facility] LD ORS;  Service: Obstetrics;  Laterality: N/A;   CESAREAN SECTION N/A    Phreesia 06/20/2020   TONSILLECTOMY      OB History     Gravida  3   Para  3   Term  1   Preterm  2   AB  0   Living  2      SAB  0   IAB  0   Ectopic  0   Multiple  0   Live Births  3            Home Medications    Prior to Admission medications   Medication Sig Start Date End Date Taking? Authorizing Provider  gabapentin  (NEURONTIN ) 300 MG capsule Take 1 capsule (300 mg total) by mouth at bedtime. 10/24/23 01/22/24 Yes Lorren, Amy J, NP  ibuprofen  (ADVIL ) 800 MG tablet Take 1  tablet (800 mg total) by mouth every 8 (eight) hours as needed (pain). Take with food to avoid stomach upset. Do not take any additional NSAIDs while on this. You may take tylenol  in addition to this if needed for extra pain relief. 12/07/23  Yes Iola Lukes, FNP  insulin  glargine (LANTUS  SOLOSTAR) 100 UNIT/ML Solostar Pen Inject 40 Units into the skin daily. 10/30/23  Yes Tanda Bleacher, MD  Accu-Chek Softclix Lancets lancets Use to check blood sugar three times daily. 07/18/22   Newlin, Enobong, MD  Blood Glucose Monitoring Suppl (ACCU-CHEK GUIDE) w/Device KIT Use to check blood sugar three times daily. 07/18/22   Newlin, Enobong, MD  Continuous Blood Gluc Sensor (FREESTYLE LIBRE 2 SENSOR) MISC Utilize one sensor q14 days to monitor blood glucose 03/07/21    Tanda Bleacher, MD  etonogestrel  (NEXPLANON ) 68 MG IMPL implant 1 each by Subdermal route once. Left Arm    [provider]  glucose blood (ACCU-CHEK GUIDE) test strip Use to check blood sugar three times daily. 07/18/22   Newlin, Enobong, MD  Insulin  Pen Needle 32G X 4 MM MISC Use to inject Lantus  once daily. 07/18/22   Newlin, Enobong, MD  metFORMIN  (GLUCOPHAGE -XR) 500 MG 24 hr tablet Take 1 tablet (500 mg total) by mouth 2 (two) times daily with a meal. 10/30/23   Tanda Bleacher, MD  predniSONE  (DELTASONE ) 20 MG tablet Take 2 tablets (40 mg total) by mouth daily. 10/11/23   Rolinda Rogue, MD    Family History Family History  Problem Relation Age of Onset   Kidney failure Father    Diabetes Mother     Social History Social History   Tobacco Use   Smoking status: Some Days    Current packs/day: 0.25    Types: Cigarettes   Smokeless tobacco: Never  Vaping Use   Vaping status: Never Used  Substance Use Topics   Alcohol use: Not Currently   Drug use: Yes    Types: Marijuana     Allergies   Patient has no known allergies.   Review of Systems Review of Systems  Musculoskeletal:  Positive for arthralgias (right knee pain). Negative for gait problem and joint swelling.  Neurological:  Negative for weakness and numbness.  All other systems reviewed and are negative.    Physical Exam Triage Vital Signs ED Triage Vitals  Encounter Vitals Group     BP 12/07/23 1011 119/77     Girls Systolic BP Percentile --      Girls Diastolic BP Percentile --      Boys Systolic BP Percentile --      Boys Diastolic BP Percentile --      Pulse Rate 12/07/23 1011 74     Resp 12/07/23 1011 18     Temp 12/07/23 1011 97.8 F (36.6 C)     Temp Source 12/07/23 1011 Oral     SpO2 12/07/23 1011 97 %     Weight 12/07/23 1009 288 lb (130.6 kg)     Height 12/07/23 1009 5' 5 (1.651 m)     Head Circumference --      Peak Flow --      Pain Score 12/07/23 1005 10     Pain Loc --      Pain  Education --      Exclude from Growth Chart --    No data found.  Updated Vital Signs BP 119/77 (BP Location: Left Arm)   Pulse 74   Temp 97.8 F (36.6 C) (Oral)  Resp 18   Ht 5' 5 (1.651 m)   Wt 288 lb (130.6 kg)   LMP  (LMP Unknown)   SpO2 97%   BMI 47.93 kg/m   Visual Acuity Right Eye Distance:   Left Eye Distance:   Bilateral Distance:    Right Eye Near:   Left Eye Near:    Bilateral Near:     Physical Exam Vitals reviewed.  Constitutional:      General: She is awake. She is not in acute distress.    Appearance: Normal appearance. She is well-developed. She is obese. She is not ill-appearing, toxic-appearing or diaphoretic.  HENT:     Head: Normocephalic.     Right Ear: Hearing normal.     Left Ear: Hearing normal.     Nose: Nose normal.     Mouth/Throat:     Mouth: Mucous membranes are moist.  Eyes:     General: Vision grossly intact.     Conjunctiva/sclera: Conjunctivae normal.  Cardiovascular:     Rate and Rhythm: Normal rate and regular rhythm.     Heart sounds: Normal heart sounds.  Pulmonary:     Effort: Pulmonary effort is normal.     Breath sounds: Normal breath sounds and air entry.  Musculoskeletal:        General: Normal range of motion.     Cervical back: Full passive range of motion without pain, normal range of motion and neck supple.     Right knee: No swelling, deformity, effusion, erythema or crepitus. Normal range of motion. Tenderness present. Normal alignment, normal meniscus and normal patellar mobility.  Skin:    General: Skin is warm and dry.  Neurological:     General: No focal deficit present.     Mental Status: She is alert and oriented to person, place, and time.     Sensory: Sensation is intact. No sensory deficit.     Motor: Motor function is intact.     Gait: Gait is intact.  Psychiatric:        Speech: Speech normal.        Behavior: Behavior is cooperative.      UC Treatments / Results  Labs (all labs ordered  are listed, but only abnormal results are displayed) Labs Reviewed - No data to display  EKG   Radiology DG Knee Complete 4 Views Right Result Date: 12/07/2023 CLINICAL DATA:  Right knee pain. EXAM: RIGHT KNEE - COMPLETE 4+ VIEW COMPARISON:  None Available. FINDINGS: No acute fracture or dislocation. Small joint effusion. Mild medial compartment joint space narrowing. Soft tissues are grossly unremarkable. IMPRESSION: 1. No acute osseous abnormality. 2. Small knee joint effusion. 3. Mild medial femorotibial compartment joint space narrowing. Electronically Signed   By: Harrietta Sherry M.D.   On: 12/07/2023 10:49    Procedures Procedures (including critical care time)  Medications Ordered in UC Medications - No data to display  Initial Impression / Assessment and Plan / UC Course  I have reviewed the triage vital signs and the nursing notes.  Pertinent labs & imaging results that were available during my care of the patient were reviewed by me and considered in my medical decision making (see chart for details).     Patient presents with right knee pain ongoing for several weeks without a specific injury. He reports stiffness and pain with certain movements but denies numbness, tingling, or weakness. X-ray revealed a very small joint effusion with no evidence of fracture, dislocation, or arthritis. Exam and  history suggest inflammation or a minor injury as the likely cause. A knee sleeve was provided for support, and the patient was instructed to use ice several times daily with a towel barrier, elevate the knee if swelling occurs, and take Motrin  three times daily as needed for pain. He was advised to follow up with orthopedics if symptoms persist or worsen over the next several days, and to seek immediate evaluation if he develops severe pain, significant swelling, inability to bear weight, or new neurological symptoms.  Today's evaluation has revealed no signs of a dangerous process.  Discussed diagnosis with patient and/or guardian. Patient and/or guardian aware of their diagnosis, possible red flag symptoms to watch out for and need for close follow up. Patient and/or guardian understands verbal and written discharge instructions. Patient and/or guardian comfortable with plan and disposition.  Patient and/or guardian has a clear mental status at this time, good insight into illness (after discussion and teaching) and has clear judgment to make decisions regarding their care  Documentation was completed with the aid of voice recognition software. Transcription may contain typographical errors. Final Clinical Impressions(s) / UC Diagnoses   Final diagnoses:  Acute pain of right knee  Effusion of right knee     Discharge Instructions      You were seen today for knee pain. There are many possible causes for knee pain. Sometimes sudden pain happens because of damage, swelling, or irritation of the muscles and tissues that support your knee. An X-ray of your knee has been done and t shows a very small effusion of your right knee but no fracture, dislocation or other abnormality was noted. An effusion happens when extra fluid builds up in or around your knee. Please take any prescribed medication exactly as directed. Do not take over-the-counter medications that contain aspirin  or NSAIDs such as Motrin , ibuprofen , or Aleve unless specifically told otherwise.  If needed, you may take Tylenol  (acetaminophen ) 1000 mg every six hours for additional pain relief. This equals two 500 mg tablets at a time. Be careful not to take more than 4000 mg of Tylenol  in a 24-hour period.  It is important to use RICE therapy to help your knee heal. Rest your knee as much as possible. Apply ice for about 20 minutes at a time, several times throughout the day. Always place a towel or cloth between the ice and your skin to protect it. If your skin becomes very red or you lose the sensation of cold, remove  the ice immediately to avoid injury.  You were placed in a knee sleeve to support your knee and help control discomfort. If your toes start to tingle, feel numb, or turn cold and blue, loosen the sleeve or brace right away. You can remove it when sleeping or showering.  Whenever possible, keep your leg elevated by propping it up on a pillow, especially under the knee, to reduce swelling.  If your symptoms do not improve within the next 10 to 14 days, please follow up with an orthopedic specialist for further evaluation.     ED Prescriptions     Medication Sig Dispense Auth. Provider   ibuprofen  (ADVIL ) 800 MG tablet Take 1 tablet (800 mg total) by mouth every 8 (eight) hours as needed (pain). Take with food to avoid stomach upset. Do not take any additional NSAIDs while on this. You may take tylenol  in addition to this if needed for extra pain relief. 21 tablet Iola Lukes, FNP  PDMP not reviewed this encounter.   Iola Lukes, [place] 12/07/23 1112

## 2023-12-07 NOTE — ED Triage Notes (Signed)
 I have a really bad sharp pain in my right knee, especially with standing or moving certain ways. No injury known. 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

## 2023-12-07 NOTE — Discharge Instructions (Addendum)
 You were seen today for knee pain. There are many possible causes for knee pain. Sometimes sudden pain happens because of damage, swelling, or irritation of the muscles and tissues that support your knee. An X-ray of your knee has been done and t shows a very small effusion of your right knee but no fracture, dislocation or other abnormality was noted. An effusion happens when extra fluid builds up in or around your knee. Please take any prescribed medication exactly as directed. Do not take over-the-counter medications that contain aspirin  or NSAIDs such as Motrin , ibuprofen , or Aleve unless specifically told otherwise.  If needed, you may take Tylenol  (acetaminophen ) 1000 mg every six hours for additional pain relief. This equals two 500 mg tablets at a time. Be careful not to take more than 4000 mg of Tylenol  in a 24-hour period.  It is important to use RICE therapy to help your knee heal. Rest your knee as much as possible. Apply ice for about 20 minutes at a time, several times throughout the day. Always place a towel or cloth between the ice and your skin to protect it. If your skin becomes very red or you lose the sensation of cold, remove the ice immediately to avoid injury.  You were placed in a knee sleeve to support your knee and help control discomfort. If your toes start to tingle, feel numb, or turn cold and blue, loosen the sleeve or brace right away. You can remove it when sleeping or showering.  Whenever possible, keep your leg elevated by propping it up on a pillow, especially under the knee, to reduce swelling.  If your symptoms do not improve within the next 10 to 14 days, please follow up with an orthopedic specialist for further evaluation.

## 2024-01-15 ENCOUNTER — Ambulatory Visit (INDEPENDENT_AMBULATORY_CARE_PROVIDER_SITE_OTHER): Admitting: Family Medicine

## 2024-01-15 ENCOUNTER — Encounter: Payer: Self-pay | Admitting: Family Medicine

## 2024-01-15 VITALS — BP 111/71 | HR 83 | Temp 98.1°F | Resp 16 | Ht 65.0 in | Wt 291.2 lb

## 2024-01-15 DIAGNOSIS — Z7984 Long term (current) use of oral hypoglycemic drugs: Secondary | ICD-10-CM | POA: Diagnosis not present

## 2024-01-15 DIAGNOSIS — Z130 Encounter for screening for diseases of the blood and blood-forming organs and certain disorders involving the immune mechanism: Secondary | ICD-10-CM | POA: Diagnosis not present

## 2024-01-15 DIAGNOSIS — E119 Type 2 diabetes mellitus without complications: Secondary | ICD-10-CM | POA: Diagnosis not present

## 2024-01-15 DIAGNOSIS — Z1329 Encounter for screening for other suspected endocrine disorder: Secondary | ICD-10-CM | POA: Diagnosis not present

## 2024-01-15 DIAGNOSIS — Z0000 Encounter for general adult medical examination without abnormal findings: Secondary | ICD-10-CM

## 2024-01-15 DIAGNOSIS — Z13228 Encounter for screening for other metabolic disorders: Secondary | ICD-10-CM | POA: Diagnosis not present

## 2024-01-15 DIAGNOSIS — Z23 Encounter for immunization: Secondary | ICD-10-CM

## 2024-01-15 DIAGNOSIS — Z136 Encounter for screening for cardiovascular disorders: Secondary | ICD-10-CM

## 2024-01-15 LAB — POCT GLYCOSYLATED HEMOGLOBIN (HGB A1C): Hemoglobin A1C: 8.6 % — AB (ref 4.0–5.6)

## 2024-01-15 NOTE — Progress Notes (Signed)
 Established Patient Office Visit  Subjective    Patient ID: Melinda Richardson, female    DOB: February 18, 1996  Age: 28 y.o. MRN: [id number]  CC:  Chief Complaint  Patient presents with   Annual Exam    HPI [name] presents for routine annual exam. Patient reports that she has not been compliant with her regimen for diabetes but denies acute complaints.   Outpatient Encounter Medications as of 01/15/2024  Medication Sig   Accu-Chek Softclix Lancets lancets Use to check blood sugar three times daily.   Blood Glucose Monitoring Suppl (ACCU-CHEK GUIDE) w/Device KIT Use to check blood sugar three times daily.   Continuous Blood Gluc Sensor (FREESTYLE LIBRE 2 SENSOR) MISC Utilize one sensor q14 days to monitor blood glucose   etonogestrel  (NEXPLANON ) 68 MG IMPL implant 1 each by Subdermal route once. Left Arm   gabapentin  (NEURONTIN ) 300 MG capsule Take 1 capsule (300 mg total) by mouth at bedtime.   glucose blood (ACCU-CHEK GUIDE) test strip Use to check blood sugar three times daily.   ibuprofen  (ADVIL ) 800 MG tablet Take 1 tablet (800 mg total) by mouth every 8 (eight) hours as needed (pain). Take with food to avoid stomach upset. Do not take any additional NSAIDs while on this. You may take tylenol  in addition to this if needed for extra pain relief.   insulin  glargine (LANTUS  SOLOSTAR) 100 UNIT/ML Solostar Pen Inject 40 Units into the skin daily.   Insulin  Pen Needle 32G X 4 MM MISC Use to inject Lantus  once daily.   metFORMIN  (GLUCOPHAGE -XR) 500 MG 24 hr tablet Take 1 tablet (500 mg total) by mouth 2 (two) times daily with a meal.   predniSONE  (DELTASONE ) 20 MG tablet Take 2 tablets (40 mg total) by mouth daily.   No facility-administered encounter medications on file as of 01/15/2024.    Past Medical History:  Diagnosis Date   Anemia    Anxiety    Arthritis    Phreesia 06/20/2020   Diabetes mellitus without complication (HCC)    Phreesia 06/20/2020   Preterm labor     Preterm premature rupture of membranes (PPROM) with unknown onset of labor 12/11/2014    Past Surgical History:  Procedure Laterality Date   CESAREAN SECTION N/A 12/17/2014   Procedure: CESAREAN SECTION;  Surgeon: Norleen Edsel GAILS, MD;  Location: [facility];  Service: Obstetrics;  Laterality: N/A;   CESAREAN SECTION N/A 04/05/2017   Procedure: CESAREAN SECTION;  Surgeon: Barbra Lang PARAS, DO;  Location: [facility];  Service: Obstetrics;  Laterality: N/A;   CESAREAN SECTION N/A 08/30/2018   Procedure: CESAREAN SECTION;  Surgeon: Jayne Vonn DEL, MD;  Location: [facility] LD [facility];  Service: Obstetrics;  Laterality: N/A;   CESAREAN SECTION N/A    Phreesia 06/20/2020   TONSILLECTOMY      Family History  Problem Relation Age of Onset   Kidney failure Father    Diabetes Mother     Social History   Socioeconomic History   Marital status: Single    Spouse name: Not on file   Number of children: Not on file   Years of education: Not on file   Highest education level: Not on file  Occupational History   Not on file  Tobacco Use   Smoking status: Some Days    Current packs/day: 0.25    Types: Cigarettes   Smokeless tobacco: Never  Vaping Use   Vaping status: Never Used  Substance and Sexual Activity   Alcohol  use: Not Currently   Drug use: Yes    Types: Marijuana   Sexual activity: Yes    Birth control/protection: Implant  Other Topics Concern   Not on file  Social History Narrative   ** Merged History Encounter **       Social Drivers of Health   Financial Resource Strain: Low Risk  (07/18/2022)   Overall Financial Resource Strain (CARDIA)    Difficulty of Paying Living Expenses: Not hard at all  Food Insecurity: No Food Insecurity (07/18/2022)   Hunger Vital Sign    Worried About Running Out of Food in the Last Year: Never true    Ran Out of Food in the Last Year: Never true  Transportation Needs: No Transportation Needs (07/04/2022)   PRAPARE - Scientist, research (physical sciences) (Medical): No    Lack of Transportation (Non-Medical): No  Physical Activity: Inactive (07/18/2022)   Exercise Vital Sign    Days of Exercise per Week: 0 days    Minutes of Exercise per Session: 0 min  Stress: No Stress Concern Present (07/18/2022)   [employer] of Occupational Health - Occupational Stress Questionnaire    Feeling of Stress : Not at all  Social Connections: Socially Isolated (10/24/2023)   Social Connection and Isolation Panel    Frequency of Communication with Friends and Family: Three times a week    Frequency of Social Gatherings with Friends and Family: Three times a week    Attends Religious Services: Never    Active Member of Clubs or Organizations: No    Attends Banker Meetings: Never    Marital Status: Never married  Intimate Partner Violence: Not At Risk (07/18/2022)   Humiliation, Afraid, Rape, and Kick questionnaire    Fear of Current or Ex-Partner: No    Emotionally Abused: No    Physically Abused: No    Sexually Abused: No    Review of Systems  All other systems reviewed and are negative.       Objective    BP 111/71   Pulse 83   Temp 98.1 F (36.7 C) (Oral)   Resp 16   Ht 5' 5 (1.651 m)   Wt 291 lb 3.2 oz (132.1 kg)   SpO2 97%   BMI 48.46 kg/m   Physical Exam Vitals and nursing note reviewed.  Constitutional:      General: She is not in acute distress.    Appearance: She is obese.  HENT:     Head: Normocephalic and atraumatic.     Right Ear: Tympanic membrane, ear canal and external ear normal.     Left Ear: Tympanic membrane, ear canal and external ear normal.     Nose: Nose normal.     Mouth/Throat:     Mouth: Mucous membranes are moist.     Pharynx: Oropharynx is clear.  Eyes:     Conjunctiva/sclera: Conjunctivae normal.     Pupils: Pupils are equal, round, and reactive to light.  Neck:     Thyroid: No thyromegaly.  Cardiovascular:     Rate and Rhythm: Normal rate and regular rhythm.      Heart sounds: Normal heart sounds. No murmur heard. Pulmonary:     Effort: Pulmonary effort is normal. No respiratory distress.     Breath sounds: Normal breath sounds.  Abdominal:     General: There is no distension.     Palpations: Abdomen is soft. There is no mass.     Tenderness: There  is no abdominal tenderness.  Musculoskeletal:        General: Normal range of motion.     Cervical back: Normal range of motion and neck supple.  Skin:    General: Skin is warm and dry.  Neurological:     General: No focal deficit present.     Mental Status: She is alert and oriented to person, place, and time.  Psychiatric:        Mood and Affect: Mood normal.        Behavior: Behavior normal.         Assessment & Plan:   Annual physical exam -     Comprehensive metabolic panel with GFR  Diabetes mellitus treated with oral medication (HCC) -     POCT glycosylated hemoglobin (Hb A1C) -     Microalbumin / creatinine urine ratio  Encounter for immunization -     Flu vaccine trivalent PF, 6mos and older(Flulaval,Afluria,Fluarix,Fluzone)  Screening for deficiency anemia -     CBC with Differential/Platelet  Encounter for screening for cardiovascular disorders -     Lipid panel  Screening for endocrine/metabolic/immunity disorders -     TSH   Discussed compliance with diabetic regimen.   No follow-ups on file.   Tanda Raguel SQUIBB, MD

## 2024-01-16 ENCOUNTER — Ambulatory Visit: Payer: Self-pay | Admitting: Family Medicine

## 2024-01-16 LAB — CBC WITH DIFFERENTIAL/PLATELET
Basophils Absolute: 0 x10E3/uL (ref 0.0–0.2)
Basos: 1 %
EOS (ABSOLUTE): 0.2 x10E3/uL (ref 0.0–0.4)
Eos: 3 %
Hematocrit: 40.1 % (ref 34.0–46.6)
Hemoglobin: 12.1 g/dL (ref 11.1–15.9)
Immature Grans (Abs): 0 x10E3/uL (ref 0.0–0.1)
Immature Granulocytes: 0 %
Lymphocytes Absolute: 2.6 x10E3/uL (ref 0.7–3.1)
Lymphs: 39 %
MCH: 27.3 pg (ref 26.6–33.0)
MCHC: 30.2 g/dL — ABNORMAL LOW (ref 31.5–35.7)
MCV: 91 fL (ref 79–97)
Monocytes Absolute: 0.4 x10E3/uL (ref 0.1–0.9)
Monocytes: 5 %
Neutrophils Absolute: 3.4 x10E3/uL (ref 1.4–7.0)
Neutrophils: 52 %
Platelets: 305 x10E3/uL (ref 150–450)
RBC: 4.43 x10E6/uL (ref 3.77–5.28)
RDW: 12.9 % (ref 11.7–15.4)
WBC: 6.5 x10E3/uL (ref 3.4–10.8)

## 2024-01-16 LAB — COMPREHENSIVE METABOLIC PANEL WITH GFR
ALT: 14 IU/L (ref 0–32)
AST: 13 IU/L (ref 0–40)
Albumin: 4.2 g/dL (ref 4.0–5.0)
Alkaline Phosphatase: 62 IU/L (ref 41–116)
BUN/Creatinine Ratio: 18 (ref 9–23)
BUN: 12 mg/dL (ref 6–20)
Bilirubin Total: 0.2 mg/dL (ref 0.0–1.2)
CO2: 22 mmol/L (ref 20–29)
Calcium: 9.3 mg/dL (ref 8.7–10.2)
Chloride: 100 mmol/L (ref 96–106)
Creatinine, Ser: 0.66 mg/dL (ref 0.57–1.00)
Globulin, Total: 3.2 g/dL (ref 1.5–4.5)
Glucose: 233 mg/dL — ABNORMAL HIGH (ref 70–99)
Potassium: 4.5 mmol/L (ref 3.5–5.2)
Sodium: 137 mmol/L (ref 134–144)
Total Protein: 7.4 g/dL (ref 6.0–8.5)
eGFR: 122 mL/min/1.73 (ref >59)

## 2024-01-16 LAB — LIPID PANEL
Chol/HDL Ratio: 4.8 ratio — ABNORMAL HIGH (ref 0.0–4.4)
Cholesterol, Total: 189 mg/dL (ref 100–199)
HDL: 39 mg/dL — ABNORMAL LOW (ref >39)
LDL Chol Calc (NIH): 130 mg/dL — ABNORMAL HIGH (ref 0–99)
Triglycerides: 108 mg/dL (ref 0–149)
VLDL Cholesterol Cal: 20 mg/dL (ref 5–40)

## 2024-01-16 LAB — MICROALBUMIN / CREATININE URINE RATIO
Creatinine, Urine: 67.9 mg/dL
Microalb/Creat Ratio: 18 mg/g{creat} (ref 0–29)
Microalbumin, Urine: 11.9 ug/mL

## 2024-01-16 LAB — TSH: TSH: 0.775 u[IU]/mL (ref 0.450–4.500)

## 2024-01-19 ENCOUNTER — Other Ambulatory Visit: Payer: Self-pay | Admitting: Family

## 2024-01-19 DIAGNOSIS — M25532 Pain in left wrist: Secondary | ICD-10-CM

## 2024-02-01 ENCOUNTER — Other Ambulatory Visit: Payer: Self-pay

## 2024-02-01 ENCOUNTER — Ambulatory Visit
Admission: EM | Admit: 2024-02-01 | Discharge: 2024-02-01 | Disposition: A | Discharge status: Home / Self Care | Attending: Physician Assistant | Admitting: Physician Assistant

## 2024-02-01 ENCOUNTER — Encounter: Payer: Self-pay | Admitting: *Deleted

## 2024-02-01 DIAGNOSIS — M545 Low back pain, unspecified: Secondary | ICD-10-CM | POA: Diagnosis not present

## 2024-02-01 MED ORDER — METHOCARBAMOL 500 MG PO TABS: 500.0000 mg | ORAL_TABLET | Freq: Four times a day (QID) | ORAL | 0 refills | Status: AC

## 2024-02-01 MED ORDER — MELOXICAM 15 MG PO TABS: 15.0000 mg | ORAL_TABLET | Freq: Every day | ORAL | 0 refills | Status: AC

## 2024-02-01 NOTE — ED Triage Notes (Signed)
 Pt reports right hip pain since mid October. She was seen at the end of August for right knee pain. States she does a lot of walking and bending at work. States the knee pain is improving but now the hip hurts. She finished the prescribed ibuprofen  a few days ago which was helping. She took tylenol  today

## 2024-02-01 NOTE — ED Provider Notes (Signed)
 [facility]    CSN: [id number] Arrival date & time: 02/01/24  1142      History   Chief Complaint Chief Complaint  Patient presents with   Hip Pain    HPI Melinda Richardson is a 28 y.o. female.   Patient complains of pain in her low back and left hip pain.  Patient reports she does a a lot of walking and a lot of bending.  Patient does not recall any specific injury.Patient has pain with walking.  Patient denies any numbness or tingling in her lower extremity  The history is provided by the patient. No language interpreter was used.  Hip Pain    Past Medical History:  Diagnosis Date   Anemia    Anxiety    Arthritis    Phreesia 06/20/2020   Diabetes mellitus without complication (HCC)    Phreesia 06/20/2020   Preterm labor    Preterm premature rupture of membranes (PPROM) with unknown onset of labor 12/11/2014    Patient Active Problem List   Diagnosis Date Noted   Trichomonas infection 07/06/2022   Nexplanon  in place 07/04/2022   Class 3 severe obesity without serious comorbidity with body mass index (BMI) of 50.0 to 59.9 in adult [facility]) 05/20/2019   History of cesarean delivery 04/05/2017    Past Surgical History:  Procedure Laterality Date   CESAREAN SECTION N/A 12/17/2014   Procedure: CESAREAN SECTION;  Surgeon: Norleen Edsel GAILS, MD;  Location: [facility];  Service: Obstetrics;  Laterality: N/A;   CESAREAN SECTION N/A 04/05/2017   Procedure: CESAREAN SECTION;  Surgeon: Barbra Lang PARAS, DO;  Location: [facility];  Service: Obstetrics;  Laterality: N/A;   CESAREAN SECTION N/A 08/30/2018   Procedure: CESAREAN SECTION;  Surgeon: Jayne Vonn DEL, MD;  Location: [facility] LD ORS;  Service: Obstetrics;  Laterality: N/A;   CESAREAN SECTION N/A    Phreesia 06/20/2020   TONSILLECTOMY      OB History     Gravida  3   Para  3   Term  1   Preterm  2   AB  0   Living  2      SAB  0   IAB  0   Ectopic  0   Multiple  0   Live Births  3             Home Medications    Prior to Admission medications   Medication Sig Start Date End Date Taking? Authorizing Provider  etonogestrel  (NEXPLANON ) 68 MG IMPL implant 1 each by Subdermal route once. Left Arm   Yes [provider]  gabapentin  (NEURONTIN ) 300 MG capsule TAKE 1 CAPSULE BY MOUTH EVERYDAY AT BEDTIME 01/22/24  Yes Tanda Bleacher, MD  insulin  glargine (LANTUS  SOLOSTAR) 100 UNIT/ML Solostar Pen Inject 40 Units into the skin daily. 10/30/23  Yes Tanda Bleacher, MD  meloxicam (MOBIC) 15 MG tablet Take 1 tablet (15 mg total) by mouth daily. 02/01/24 01/31/25 Yes Flint Sonny POUR, PA-C  metFORMIN  (GLUCOPHAGE -XR) 500 MG 24 hr tablet Take 1 tablet (500 mg total) by mouth 2 (two) times daily with a meal. 10/30/23  Yes Tanda Bleacher, MD  methocarbamol (ROBAXIN) 500 MG tablet Take 1 tablet (500 mg total) by mouth 4 (four) times daily for 10 days. 02/01/24 02/11/24 Yes Flint Sonny K, PA-C  Accu-Chek Softclix Lancets lancets Use to check blood sugar three times daily. 07/18/22   Newlin, Enobong, MD  Blood Glucose Monitoring Suppl (ACCU-CHEK GUIDE) w/Device KIT Use  to check blood sugar three times daily. 07/18/22   Newlin, Enobong, MD  Continuous Blood Gluc Sensor (FREESTYLE LIBRE 2 SENSOR) MISC Utilize one sensor q14 days to monitor blood glucose 03/07/21   Tanda Bleacher, MD  glucose blood (ACCU-CHEK GUIDE) test strip Use to check blood sugar three times daily. 07/18/22   Newlin, Enobong, MD  Insulin  Pen Needle 32G X 4 MM MISC Use to inject Lantus  once daily. 07/18/22   Newlin, Enobong, MD  predniSONE  (DELTASONE ) 20 MG tablet Take 2 tablets (40 mg total) by mouth daily. Patient not taking: Reported on 02/01/2024 10/11/23   Rolinda Rogue, MD    Family History Family History  Problem Relation Age of Onset   Kidney failure Father    Diabetes Mother     Social History Social History   Tobacco Use   Smoking status: Some Days    Current packs/day: 0.25    Types: Cigarettes   Smokeless  tobacco: Never  Vaping Use   Vaping status: Never Used  Substance Use Topics   Alcohol use: Not Currently   Drug use: Yes    Types: Marijuana     Allergies   Patient has no known allergies.   Review of Systems Review of Systems  All other systems reviewed and are negative.    Physical Exam Triage Vital Signs ED Triage Vitals  Encounter Vitals Group     BP 02/01/24 1204 119/77     Girls Systolic BP Percentile --      Girls Diastolic BP Percentile --      Boys Systolic BP Percentile --      Boys Diastolic BP Percentile --      Pulse Rate 02/01/24 1204 83     Resp 02/01/24 1204 18     Temp 02/01/24 1204 98.6 F (37 C)     Temp Source 02/01/24 1204 Oral     SpO2 02/01/24 1204 97 %     Weight --      Height --      Head Circumference --      Peak Flow --      Pain Score 02/01/24 1202 7     Pain Loc --      Pain Education --      Exclude from Growth Chart --    No data found.  Updated Vital Signs BP 119/77 (BP Location: Left Arm)   Pulse 83   Temp 98.6 F (37 C) (Oral)   Resp 18   SpO2 97%   Visual Acuity Right Eye Distance:   Left Eye Distance:   Bilateral Distance:    Right Eye Near:   Left Eye Near:    Bilateral Near:     Physical Exam Vitals and nursing note reviewed.  Constitutional:      Appearance: She is well-developed.  HENT:     Head: Normocephalic.  Cardiovascular:     Rate and Rhythm: Normal rate.  Pulmonary:     Effort: Pulmonary effort is normal.  Abdominal:     General: There is no distension.  Musculoskeletal:        General: Normal range of motion.     Cervical back: Normal range of motion.     Comments: Hip nontender, from,  nv and ns intact,  tender low back to palpation   Skin:    General: Skin is warm.  Neurological:     General: No focal deficit present.     Mental Status: She is alert and  oriented to person, place, and time.      UC Treatments / Results  Labs (all labs ordered are listed, but only abnormal  results are displayed) Labs Reviewed - No data to display  EKG   Radiology No results found.  Procedures Procedures (including critical care time)  Medications Ordered in UC Medications - No data to display  Initial Impression / Assessment and Plan / UC Course  I have reviewed the triage vital signs and the nursing notes.  Pertinent labs & imaging results that were available during my care of the patient were reviewed by me and considered in my medical decision making (see chart for details).     Pt counseled on low back pain.  Pt given rx for mobic and robaxin.  Pt advised to return if any problems.  Final Clinical Impressions(s) / UC Diagnoses   Final diagnoses:  Acute low back pain, unspecified back pain laterality, unspecified whether sciatica present   Discharge Instructions   None    ED Prescriptions     Medication Sig Dispense Auth. Provider   meloxicam (MOBIC) 15 MG tablet Take 1 tablet (15 mg total) by mouth daily. 30 tablet Dre Gamino K, PA-C   methocarbamol (ROBAXIN) 500 MG tablet Take 1 tablet (500 mg total) by mouth 4 (four) times daily for 10 days. 40 tablet Flint Sonny POUR, PA-C      PDMP not reviewed this encounter. An After Visit Summary was printed and given to the patient.       Flint Sonny POUR, PA-C 02/05/24 [phone]

## 2024-02-14 ENCOUNTER — Ambulatory Visit: Admitting: Obstetrics and Gynecology

## 2024-03-05 ENCOUNTER — Ambulatory Visit: Admitting: Family Medicine

## 2024-04-29 ENCOUNTER — Ambulatory Visit: Admitting: Family Medicine

## 2024-06-12 ENCOUNTER — Ambulatory Visit: Admitting: Family Medicine
